# Patient Record
Sex: Female | Born: 1935 | Race: White | Hispanic: No | Smoking: Never smoker
Health system: Southern US, Community
[De-identification: ages and names within clinical notes are randomized; demographics above are authoritative.]

## PROBLEM LIST (undated history)

## (undated) DIAGNOSIS — F419 Anxiety disorder, unspecified: Secondary | ICD-10-CM

## (undated) DIAGNOSIS — I10 Essential (primary) hypertension: Secondary | ICD-10-CM

## (undated) DIAGNOSIS — K5900 Constipation, unspecified: Secondary | ICD-10-CM

## (undated) DIAGNOSIS — K8050 Calculus of bile duct without cholangitis or cholecystitis without obstruction: Secondary | ICD-10-CM

## (undated) DIAGNOSIS — A419 Sepsis, unspecified organism: Secondary | ICD-10-CM

## (undated) DIAGNOSIS — E7800 Pure hypercholesterolemia, unspecified: Secondary | ICD-10-CM

## (undated) DIAGNOSIS — J189 Pneumonia, unspecified organism: Secondary | ICD-10-CM

## (undated) DIAGNOSIS — C50419 Malignant neoplasm of upper-outer quadrant of unspecified female breast: Secondary | ICD-10-CM

## (undated) HISTORY — DX: Malignant neoplasm of upper-outer quadrant of unspecified female breast: C50.419

## (undated) HISTORY — DX: Anxiety disorder, unspecified: F41.9

## (undated) HISTORY — PX: TUMOR REMOVAL: SHX12

## (undated) HISTORY — PX: BREAST SURGERY: SHX581

## (undated) HISTORY — PX: HEMORRHOID SURGERY: SHX153

## (undated) HISTORY — DX: Pure hypercholesterolemia, unspecified: E78.00

## (undated) HISTORY — DX: Constipation, unspecified: K59.00

## (undated) HISTORY — PX: TONSILLECTOMY: SUR1361

---

## 2001-03-23 ENCOUNTER — Other Ambulatory Visit: Admission: RE | Admit: 2001-03-23 | Discharge: 2001-03-23 | Payer: Self-pay | Admitting: Family Medicine

## 2002-04-05 ENCOUNTER — Other Ambulatory Visit: Admission: RE | Admit: 2002-04-05 | Discharge: 2002-04-05 | Payer: Self-pay | Admitting: Family Medicine

## 2002-06-09 ENCOUNTER — Ambulatory Visit (HOSPITAL_COMMUNITY): Admission: RE | Admit: 2002-06-09 | Discharge: 2002-06-09 | Payer: Self-pay | Admitting: Gastroenterology

## 2003-05-04 ENCOUNTER — Other Ambulatory Visit: Admission: RE | Admit: 2003-05-04 | Discharge: 2003-05-04 | Payer: Self-pay | Admitting: Family Medicine

## 2004-05-21 ENCOUNTER — Other Ambulatory Visit: Admission: RE | Admit: 2004-05-21 | Discharge: 2004-05-21 | Payer: Self-pay | Admitting: Family Medicine

## 2005-05-27 ENCOUNTER — Other Ambulatory Visit: Admission: RE | Admit: 2005-05-27 | Discharge: 2005-05-27 | Payer: Self-pay | Admitting: Family Medicine

## 2006-06-09 ENCOUNTER — Other Ambulatory Visit: Admission: RE | Admit: 2006-06-09 | Discharge: 2006-06-09 | Payer: Self-pay | Admitting: Family Medicine

## 2007-06-17 ENCOUNTER — Other Ambulatory Visit: Admission: RE | Admit: 2007-06-17 | Discharge: 2007-06-17 | Payer: Self-pay | Admitting: Family Medicine

## 2008-09-06 ENCOUNTER — Other Ambulatory Visit: Admission: RE | Admit: 2008-09-06 | Discharge: 2008-09-06 | Payer: Self-pay | Admitting: Family Medicine

## 2009-09-12 ENCOUNTER — Other Ambulatory Visit: Admission: RE | Admit: 2009-09-12 | Discharge: 2009-09-12 | Payer: Self-pay | Admitting: Family Medicine

## 2010-09-20 ENCOUNTER — Other Ambulatory Visit: Payer: Self-pay | Admitting: Family Medicine

## 2010-09-20 ENCOUNTER — Other Ambulatory Visit (HOSPITAL_COMMUNITY)
Admission: RE | Admit: 2010-09-20 | Discharge: 2010-09-20 | Disposition: A | Discharge status: Home / Self Care | Payer: Medicare Other | Source: Ambulatory Visit | Attending: Family Medicine | Admitting: Family Medicine

## 2010-09-20 DIAGNOSIS — Z124 Encounter for screening for malignant neoplasm of cervix: Secondary | ICD-10-CM | POA: Insufficient documentation

## 2010-11-01 ENCOUNTER — Other Ambulatory Visit: Payer: Self-pay

## 2010-11-02 ENCOUNTER — Other Ambulatory Visit: Payer: Self-pay | Admitting: Radiology

## 2010-11-02 DIAGNOSIS — C50911 Malignant neoplasm of unspecified site of right female breast: Secondary | ICD-10-CM

## 2010-11-07 ENCOUNTER — Other Ambulatory Visit: Payer: Self-pay | Admitting: Oncology

## 2010-11-07 ENCOUNTER — Encounter (HOSPITAL_BASED_OUTPATIENT_CLINIC_OR_DEPARTMENT_OTHER): Payer: Medicare Other | Admitting: Oncology

## 2010-11-07 ENCOUNTER — Ambulatory Visit (HOSPITAL_BASED_OUTPATIENT_CLINIC_OR_DEPARTMENT_OTHER): Payer: Medicare Other | Admitting: General Surgery

## 2010-11-07 ENCOUNTER — Encounter (INDEPENDENT_AMBULATORY_CARE_PROVIDER_SITE_OTHER): Payer: Self-pay | Admitting: General Surgery

## 2010-11-07 VITALS — BP 161/83 | HR 68 | Temp 97.9°F | Resp 20 | Ht <= 58 in | Wt 124.4 lb

## 2010-11-07 DIAGNOSIS — C50419 Malignant neoplasm of upper-outer quadrant of unspecified female breast: Secondary | ICD-10-CM

## 2010-11-07 DIAGNOSIS — C50919 Malignant neoplasm of unspecified site of unspecified female breast: Secondary | ICD-10-CM

## 2010-11-07 DIAGNOSIS — Z171 Estrogen receptor negative status [ER-]: Secondary | ICD-10-CM

## 2010-11-07 HISTORY — DX: Malignant neoplasm of upper-outer quadrant of unspecified female breast: C50.419

## 2010-11-07 LAB — CBC WITH DIFFERENTIAL/PLATELET
Basophils Absolute: 0.1 10*3/uL (ref 0.0–0.1)
EOS%: 2 % (ref 0.0–7.0)
Eosinophils Absolute: 0.1 10*3/uL (ref 0.0–0.5)
HGB: 14 g/dL (ref 11.6–15.9)
LYMPH%: 16.2 % (ref 14.0–49.7)
MCH: 28.8 pg (ref 25.1–34.0)
MCV: 84 fL (ref 79.5–101.0)
MONO%: 8 % (ref 0.0–14.0)
NEUT%: 72.6 % (ref 38.4–76.8)
Platelets: 151 10*3/uL (ref 145–400)
RDW: 14.7 % — ABNORMAL HIGH (ref 11.2–14.5)

## 2010-11-07 LAB — CANCER ANTIGEN 27.29: CA 27.29: 25 U/mL (ref 0–39)

## 2010-11-07 LAB — CMP (CANCER CENTER ONLY)
ALT(SGPT): 26 U/L (ref 10–47)
AST: 24 U/L (ref 11–38)
Albumin: 3.6 g/dL (ref 3.3–5.5)
Calcium: 9.1 mg/dL (ref 8.0–10.3)
Chloride: 99 mEq/L (ref 98–108)
Potassium: 3.2 mEq/L — ABNORMAL LOW (ref 3.3–4.7)
Sodium: 139 mEq/L (ref 128–145)

## 2010-11-07 LAB — COMPREHENSIVE METABOLIC PANEL
Albumin: 3.7 g/dL (ref 3.5–5.2)
Alkaline Phosphatase: 74 U/L (ref 39–117)
BUN: 13 mg/dL (ref 6–23)
Calcium: 9.6 mg/dL (ref 8.4–10.5)
Chloride: 101 mEq/L (ref 96–112)
Glucose, Bld: 114 mg/dL — ABNORMAL HIGH (ref 70–99)
Potassium: 2.5 mEq/L — CL (ref 3.5–5.3)

## 2010-11-07 NOTE — Progress Notes (Signed)
Chief Complaint  Patient presents with  . Breast Cancer    HPI Jenna [name] is a 77 y.o. female.   HPI This patient is seen in evaluation today for a newly diagnosed right breast cancer. This was found on annual screening mammogram as a suspicious-appearing nodule in the right breast. She had an ultrasound-guided core biopsy which demonstrated well to moderately differentiated invasive ductal carcinoma.she states that she stay for with her screening mammograms and has not had any prior abnormalities except for benign fibrous tissue removed from bilateral breasts in the 1960s and 70s. She also states that she does her self breast exam monthly and has not noticed any changes. She denies any systemic symptoms such as abnormal weight loss, bony pains, or headaches. She states that she did have a short period of  hormone use, but does not currently take any hormones or contraceptives pills.she walks daily and states that she has good exercise tolerance she states that she had a stress test in the 90s but has not had any cardiac problems. On mammogram this appears to be a 1.5 cm mass in the right breast her MRI is pending.she is of [employer] and has a daughter who had bilateral mastectomy for DCIS at age 52 otherwise she has a first and second cousin on her mother side which was diagnosed with breast cancer but no other family history.  Past Medical History  Diagnosis Date  . Hypercholesteremia   . Anxiety   . Osteoporosis     No past surgical history on file.  No family history on file.  Social History History  Substance Use Topics  . Smoking status: Never Smoker   . Smokeless tobacco: Not on file  . Alcohol Use: No    Allergies  Allergen Reactions  . Sulfa Antibiotics Rash    Current Outpatient Prescriptions  Medication Sig Dispense Refill  . alendronate (FOSAMAX) 70 MG tablet 70 mg once a week.       Marland Kitchen aspirin 81 MG tablet Take 81 mg by mouth daily.        Marland Kitchen  atenolol (TENORMIN) 100 MG tablet       . atorvastatin (LIPITOR) 40 MG tablet       . cholecalciferol (VITAMIN D) 1000 UNITS tablet Take 1,000 Units by mouth daily.        Marland Kitchen LORazepam (ATIVAN) 1 MG tablet Take 1 mg by mouth every 8 (eight) hours.        Marland Kitchen losartan-hydrochlorothiazide (HYZAAR) 50-12.5 MG per tablet       . senna (SENOKOT) 8.6 MG TABS Take 1 tablet by mouth.        . sertraline (ZOLOFT) 100 MG tablet 50 mg 1 day or 1 dose.         Review of Systems Review of Systems  Constitutional: Negative.   HENT: Negative.   Eyes: Negative.   Respiratory: Negative.   Cardiovascular: Negative.   Gastrointestinal: Negative.   Genitourinary: Negative.   Musculoskeletal: Negative.   Skin: Negative.   Neurological: Negative.   Hematological: Negative.   Psychiatric/Behavioral: Negative.   All other systems reviewed and are negative.    Blood pressure 161/83, pulse 68, temperature 97.9 F (36.6 C), resp. rate 20, height 4\' 10"  (1.473 m), weight 124 lb 6.4 oz (56.427 kg).  Physical Exam Physical Exam  Constitutional: She is oriented to person, place, and time. She appears well-developed and well-nourished. No distress.  HENT:  Head: Normocephalic and atraumatic.  Eyes: Conjunctivae  and EOM are normal. Pupils are equal, round, and reactive to light. Right eye exhibits no discharge. Left eye exhibits no discharge. No scleral icterus.  Neck: Normal range of motion. Neck supple. No tracheal deviation present.  Pulmonary/Chest: Effort normal. No stridor. No respiratory distress.       On the left breast she has a prior biopsy scar in the upper inner portion of the breast in an oblique fashion extending towards the nipple areolar complex, there is no evidence of skin changes or dimpling, no suspicious masses, no axillary or supraclavicular adenopathy  On the right breast she has area in the upper outer quadrant with a well-healed surgical scar from a prior biopsy and this is the area of  her most recent core biopsy. She has some postbiopsy changes but she does have some mild dimpling in the region but cannot be sure if this is due to her current tumor or 2 her previous biopsy. She does have some fullness in the area but no discrete mass. She has no evidence of axillary or supraclavicular adenopathy on the right.  Abdominal: Soft. She exhibits no distension. There is no tenderness. There is no rebound.  Musculoskeletal: Normal range of motion.  Neurological: She is alert and oriented to person, place, and time.  Skin: Skin is warm and dry. No rash noted. She is not diaphoretic. No erythema.  Psychiatric: She has a normal mood and affect. Her behavior is normal. Judgment and thought content normal.    Data Reviewed Mammogram and pathologyand laboratory studies are available and without significant abnormality other than some hypokalemia  Assessment/Plan    Right breast cancer  She does have well to moderately differentiated invasive ductal carcinoma on the right. This is not obviously involving the skin or the surrounding tissues but she does have some mild dimpling in the area, I cannot be sure if this is from her tumor or from prior biopsy site. I had a long discussion with her and her family regarding the treatment options in the multidisciplinary breast clinic. We discussed the surgical options of breast conservation therapy versus mastectomy with the risks and benefits of each and the pros and cons of each, I think she would be a fine candidate for either option.. We discussed needle localized lumpectomy with sentinel lymph node biopsy versus mastectomy with sentinel lymph node biopsy and she is leaning towards breast conservation treatment.I explained that if she chooses breast conservation treatment and she will require postoperative radiation treatment. She is scheduled to meet with the radiation oncologist today as well as well as the medical oncologist. Again, she is leaning  towards lumpectomy with sentinel lymph node biopsy. I discussed with her the risks of the surgery including the need for reoperative treatment and reexcision, need for radiation, poor cosmesis, infection, bleeding, nerve injury, lymphedema, false positive for false negative results. She expressed understanding and would like to proceed with breast conservation treatment. She has a friend who is treated by my partner Dr. Jamey Ripa for a similar problem and she would like to see him for evaluation and surgical management. She was given her contact information and desires to follow up with him.I spent 45 minutes with the patient in the family and discussing her results and future treatment plans.               Lodema Pilot DAVID 11/07/2010, 12:07 PM

## 2010-11-09 ENCOUNTER — Ambulatory Visit
Admission: RE | Admit: 2010-11-09 | Discharge: 2010-11-09 | Disposition: A | Discharge status: Home / Self Care | Payer: Medicare Other | Source: Ambulatory Visit | Attending: Radiology | Admitting: Radiology

## 2010-11-09 ENCOUNTER — Encounter: Payer: Medicare Other | Admitting: Genetic Counselor

## 2010-11-09 DIAGNOSIS — C50911 Malignant neoplasm of unspecified site of right female breast: Secondary | ICD-10-CM

## 2010-11-09 MED ORDER — GADOBENATE DIMEGLUMINE 529 MG/ML IV SOLN
11.0000 mL | Freq: Once | INTRAVENOUS | Status: AC | PRN
  Administered 2010-11-09: 11 mL via INTRAVENOUS

## 2010-11-13 ENCOUNTER — Encounter (INDEPENDENT_AMBULATORY_CARE_PROVIDER_SITE_OTHER): Payer: Medicare Other | Admitting: Surgery

## 2010-11-20 ENCOUNTER — Ambulatory Visit (INDEPENDENT_AMBULATORY_CARE_PROVIDER_SITE_OTHER): Payer: Medicare Other | Admitting: Surgery

## 2010-11-20 ENCOUNTER — Encounter (INDEPENDENT_AMBULATORY_CARE_PROVIDER_SITE_OTHER): Payer: Self-pay | Admitting: Surgery

## 2010-11-20 ENCOUNTER — Other Ambulatory Visit (INDEPENDENT_AMBULATORY_CARE_PROVIDER_SITE_OTHER): Payer: Self-pay | Admitting: Surgery

## 2010-11-20 VITALS — BP 126/82 | HR 72 | Temp 96.2°F | Ht 59.75 in | Wt 125.5 lb

## 2010-11-20 DIAGNOSIS — C50911 Malignant neoplasm of unspecified site of right female breast: Secondary | ICD-10-CM

## 2010-11-20 DIAGNOSIS — C50419 Malignant neoplasm of upper-outer quadrant of unspecified female breast: Secondary | ICD-10-CM

## 2010-11-20 NOTE — Progress Notes (Signed)
CC: Breast cancer HPI: This patient recently had a routine mammogram and an abnormality was found in the right breast. A needle core biopsy showed invasive ductal carcinoma. She was seen at the breast multidisciplinary clinic and an MRI requested and the suggestion made for partial mastectomy with sentinel node evaluation, assuming the MRI is negative. She was also scheduled for a bone scan.  Past Medical History  Diagnosis Date  . Hypercholesteremia   . Anxiety   . Osteoporosis    Current Outpatient Prescriptions  Medication Sig Dispense Refill  . alendronate (FOSAMAX) 70 MG tablet 70 mg once a week.       Marland Kitchen atenolol (TENORMIN) 100 MG tablet       . atorvastatin (LIPITOR) 40 MG tablet       . cholecalciferol (VITAMIN D) 1000 UNITS tablet Take 1,000 Units by mouth daily.        Marland Kitchen LORazepam (ATIVAN) 1 MG tablet Take 1 mg by mouth every 8 (eight) hours.        Marland Kitchen losartan-hydrochlorothiazide (HYZAAR) 50-12.5 MG per tablet       . senna (SENOKOT) 8.6 MG TABS Take 1 tablet by mouth.        . sertraline (ZOLOFT) 100 MG tablet 50 mg 1 day or 1 dose.        Allergies  Allergen Reactions  . Sulfa Antibiotics Rash       PE General: The patient is alert oriented and healthy-appearing Lungs: Clear to auscultation, normal respirations. Heart: Regular rhythm no murmurs rubs or gallops. Breasts: Symmetric. There is a right breast scar and a left breast scar from remote biopsies. There is no dominant mass on either side. There is no evidence of a hematoma or ecchymosis from her biopsy. The skin nipple and areolar areas looked normal. Lymphatics: There is no axillary or supraclavicular adenopathy noted. Abdomen: Soft and benign. No masses or organomegaly. Extremities: Good range of motion, no edema. Minimal varicosities.  Data Reviewed Her MRI focal report indicates a single 1.6 cm in maximum size right breast mass consistent with a cancer. There is no evidence of other breast cancers or  lesions.  I have seen her pathology slides and mammogram films in the breast multidisciplinary conference.  Assessment Clinical stage I right breast cancer upper outer quadrant  Plan Currently I think the best plan is for a needle guided lumpectomy with sentinel node evaluation. This could change if she has evidence of metastatic disease on her scan scheduled for tomorrow.  I discussed the indications risks and complications of the surgery. I told her that she may have to have a reexcision if she has positive margins. I told her the alternative was for a mastectomy. I thought she would have an equivalent to a rate with a lumpectomy and recommended that is her choice.  We want to try to get her scheduled at her convenience hopefully in the next two weeks.

## 2010-11-21 ENCOUNTER — Encounter (HOSPITAL_COMMUNITY)
Admission: RE | Admit: 2010-11-21 | Discharge: 2010-11-21 | Disposition: A | Discharge status: Home / Self Care | Payer: Medicare Other | Source: Ambulatory Visit | Attending: Oncology | Admitting: Oncology

## 2010-11-21 ENCOUNTER — Telehealth (INDEPENDENT_AMBULATORY_CARE_PROVIDER_SITE_OTHER): Payer: Self-pay | Admitting: Surgery

## 2010-11-21 ENCOUNTER — Encounter (HOSPITAL_COMMUNITY): Payer: Self-pay

## 2010-11-21 DIAGNOSIS — M479 Spondylosis, unspecified: Secondary | ICD-10-CM | POA: Insufficient documentation

## 2010-11-21 DIAGNOSIS — K449 Diaphragmatic hernia without obstruction or gangrene: Secondary | ICD-10-CM | POA: Insufficient documentation

## 2010-11-21 DIAGNOSIS — M4120 Other idiopathic scoliosis, site unspecified: Secondary | ICD-10-CM | POA: Insufficient documentation

## 2010-11-21 DIAGNOSIS — C50919 Malignant neoplasm of unspecified site of unspecified female breast: Secondary | ICD-10-CM

## 2010-11-21 DIAGNOSIS — J984 Other disorders of lung: Secondary | ICD-10-CM | POA: Insufficient documentation

## 2010-11-21 MED ORDER — TECHNETIUM TC 99M MEDRONATE IV KIT
26.1000 | PACK | Freq: Once | INTRAVENOUS | Status: AC | PRN
  Administered 2010-11-21: 26.1 via INTRAVENOUS

## 2010-11-22 ENCOUNTER — Encounter (HOSPITAL_BASED_OUTPATIENT_CLINIC_OR_DEPARTMENT_OTHER)
Admission: RE | Admit: 2010-11-22 | Discharge: 2010-11-22 | Disposition: A | Discharge status: Home / Self Care | Payer: Medicare Other | Source: Ambulatory Visit | Attending: Surgery | Admitting: Surgery

## 2010-11-22 ENCOUNTER — Telehealth (INDEPENDENT_AMBULATORY_CARE_PROVIDER_SITE_OTHER): Payer: Self-pay | Admitting: General Surgery

## 2010-11-22 LAB — BASIC METABOLIC PANEL
GFR calc Af Amer: >60 mL/min (ref >60)
GFR calc non Af Amer: >60 mL/min (ref >60)
Glucose, Bld: 104 mg/dL — ABNORMAL HIGH (ref 70–99)
Potassium: 3.6 mEq/L (ref 3.5–5.1)
Sodium: 140 mEq/L (ref 135–145)

## 2010-11-22 LAB — CBC
Hemoglobin: 13.6 g/dL (ref 12.0–15.0)
MCHC: 33.7 g/dL (ref 30.0–36.0)

## 2010-11-22 LAB — DIFFERENTIAL
Basophils Absolute: 0 10*3/uL (ref 0.0–0.1)
Basophils Relative: 1 % (ref 0–1)
Monocytes Absolute: 0.5 10*3/uL (ref 0.1–1.0)
Neutro Abs: 3.4 10*3/uL (ref 1.7–7.7)
Neutrophils Relative %: 62 % (ref 43–77)

## 2010-11-22 NOTE — Telephone Encounter (Signed)
Patient returned call from Dr Jamey Ripa. I made her aware Dr Jamey Ripa was going to go over results from her CT and bone scan. I made patient aware that everything looked ok but her CT did show some tiny lung nodules that Dr Jamey Ripa said were probably nothing to worry about. Patient made aware and will proceed with planned surgery. Patient will call with any problems.

## 2010-11-23 ENCOUNTER — Encounter: Payer: Medicare Other | Admitting: Oncology

## 2010-11-23 ENCOUNTER — Telehealth (INDEPENDENT_AMBULATORY_CARE_PROVIDER_SITE_OTHER): Payer: Self-pay | Admitting: Surgery

## 2010-11-23 NOTE — Telephone Encounter (Signed)
The CT scan of the chest showed a couple of likely benign nodules. I called the patient to review the results with her.

## 2010-11-23 NOTE — Telephone Encounter (Signed)
I spoke with the patient's daughter today to discuss the results of the CT scan. The patient was notified yesterday that a couple of tiny nodules were noted in the lung on the chest CT. These are nonspecific and likely benign. She has to be speak with the daughter as well. I explained the results to the daughter and told her that in patients at risk for lung cancer a followup CT is appropriate at about 12 months. Otherwise no particular followup is needed.

## 2010-11-26 ENCOUNTER — Other Ambulatory Visit (INDEPENDENT_AMBULATORY_CARE_PROVIDER_SITE_OTHER): Payer: Self-pay | Admitting: Surgery

## 2010-11-26 ENCOUNTER — Encounter (INDEPENDENT_AMBULATORY_CARE_PROVIDER_SITE_OTHER): Payer: Self-pay | Admitting: Surgery

## 2010-11-26 ENCOUNTER — Ambulatory Visit (HOSPITAL_BASED_OUTPATIENT_CLINIC_OR_DEPARTMENT_OTHER)
Admission: RE | Admit: 2010-11-26 | Discharge: 2010-11-26 | Disposition: A | Discharge status: Home / Self Care | Payer: Medicare Other | Source: Ambulatory Visit | Attending: Surgery | Admitting: Surgery

## 2010-11-26 ENCOUNTER — Ambulatory Visit (HOSPITAL_COMMUNITY)
Admission: RE | Admit: 2010-11-26 | Discharge: 2010-11-26 | Disposition: A | Discharge status: Home / Self Care | Payer: Medicare Other | Source: Ambulatory Visit | Attending: Surgery | Admitting: Surgery

## 2010-11-26 DIAGNOSIS — C50919 Malignant neoplasm of unspecified site of unspecified female breast: Secondary | ICD-10-CM | POA: Insufficient documentation

## 2010-11-26 DIAGNOSIS — D0590 Unspecified type of carcinoma in situ of unspecified breast: Secondary | ICD-10-CM | POA: Insufficient documentation

## 2010-11-26 DIAGNOSIS — C50911 Malignant neoplasm of unspecified site of right female breast: Secondary | ICD-10-CM

## 2010-11-26 DIAGNOSIS — C50419 Malignant neoplasm of upper-outer quadrant of unspecified female breast: Secondary | ICD-10-CM | POA: Insufficient documentation

## 2010-11-26 DIAGNOSIS — Z01810 Encounter for preprocedural cardiovascular examination: Secondary | ICD-10-CM | POA: Insufficient documentation

## 2010-11-26 DIAGNOSIS — Z01812 Encounter for preprocedural laboratory examination: Secondary | ICD-10-CM | POA: Insufficient documentation

## 2010-11-26 HISTORY — PX: BREAST LUMPECTOMY W/ NEEDLE LOCALIZATION: SHX1266

## 2010-11-26 MED ORDER — TECHNETIUM TC 99M SULFUR COLLOID FILTERED
1.0000 | Freq: Once | INTRAVENOUS | Status: AC | PRN
  Administered 2010-11-26: 1 via INTRADERMAL

## 2010-11-29 ENCOUNTER — Telehealth (INDEPENDENT_AMBULATORY_CARE_PROVIDER_SITE_OTHER): Payer: Self-pay | Admitting: General Surgery

## 2010-11-29 ENCOUNTER — Encounter (INDEPENDENT_AMBULATORY_CARE_PROVIDER_SITE_OTHER): Payer: Self-pay | Admitting: Surgery

## 2010-11-29 NOTE — Telephone Encounter (Signed)
Called patient. Left message on machine for patient to call back for path results.

## 2010-11-29 NOTE — Telephone Encounter (Signed)
Message copied by Liliana Cline on Thu Nov 29, 2010  1:18 PM ------      Message from: Currie Paris      Created: Thu Nov 29, 2010 12:58 PM       Tell the patient that her margins are OK and her lymph nodes are negative. I will discuss in detail in the office.

## 2010-11-30 NOTE — Telephone Encounter (Signed)
Patient made aware that her pathology results were good. Lymph nodes negative and margins clear. She will follow up next Thursday and call with any problems prior.

## 2010-12-06 ENCOUNTER — Encounter (INDEPENDENT_AMBULATORY_CARE_PROVIDER_SITE_OTHER): Payer: Self-pay | Admitting: Surgery

## 2010-12-06 ENCOUNTER — Ambulatory Visit (INDEPENDENT_AMBULATORY_CARE_PROVIDER_SITE_OTHER): Payer: Medicare Other | Admitting: Surgery

## 2010-12-06 VITALS — BP 114/72 | HR 68 | Temp 97.2°F | Resp 16 | Ht 59.5 in | Wt 126.0 lb

## 2010-12-06 DIAGNOSIS — C50419 Malignant neoplasm of upper-outer quadrant of unspecified female breast: Secondary | ICD-10-CM

## 2010-12-06 MED ORDER — HYDROCODONE-ACETAMINOPHEN 10-325 MG PO TABS: 1.0000 | ORAL_TABLET | ORAL | Status: DC | PRN

## 2010-12-06 NOTE — Patient Instructions (Signed)
We will give you a different pain pill prescription to take. Need to start exercising or shoulder more. We will give you some information about the after breast cancer class. I will need to see you back in a month call me if you're having any problems

## 2010-12-06 NOTE — Progress Notes (Signed)
Jenna [name]    [id number] 12/06/2010    03-02-1932   CC: Post op   HPI: The patient returns for post op follow-up. She underwent a right lumpectomy with SLN on 11/26/10. Over all she feels that she is doing well. She has some pain still, mainly upper posterior arm  PE: The incision is healing nicely and there is no evidence of infection or hematoma.  DATA REVIEWED: Pathology report showed IDC 1.5 cm, neg SLN x4, margins ok  IMPRESSION: Patient doing well. Lumpectomy   PLAN: Her next visit will be in one month. She has oncology follow up in two weeks. Gave her the [facility] materials. Gave her a copy of the path.

## 2010-12-18 ENCOUNTER — Other Ambulatory Visit: Payer: Self-pay | Admitting: Oncology

## 2010-12-18 ENCOUNTER — Encounter (HOSPITAL_BASED_OUTPATIENT_CLINIC_OR_DEPARTMENT_OTHER): Payer: Medicare Other | Admitting: Oncology

## 2010-12-18 DIAGNOSIS — C50419 Malignant neoplasm of upper-outer quadrant of unspecified female breast: Secondary | ICD-10-CM

## 2010-12-18 LAB — CBC WITH DIFFERENTIAL/PLATELET
Basophils Absolute: 0 10*3/uL (ref 0.0–0.1)
Eosinophils Absolute: 0.1 10*3/uL (ref 0.0–0.5)
HGB: 13.6 g/dL (ref 11.6–15.9)
MCV: 84.7 fL (ref 79.5–101.0)
MONO#: 0.5 10*3/uL (ref 0.1–0.9)
NEUT#: 3.1 10*3/uL (ref 1.5–6.5)
RDW: 15.1 % — ABNORMAL HIGH (ref 11.2–14.5)
WBC: 5 10*3/uL (ref 3.9–10.3)
lymph#: 1.3 10*3/uL (ref 0.9–3.3)

## 2010-12-18 LAB — BASIC METABOLIC PANEL
BUN: 19 mg/dL (ref 6–23)
CO2: 24 mEq/L (ref 19–32)
Chloride: 104 mEq/L (ref 96–112)
Glucose, Bld: 93 mg/dL (ref 70–99)
Potassium: 3.3 mEq/L — ABNORMAL LOW (ref 3.5–5.3)

## 2011-01-01 ENCOUNTER — Ambulatory Visit
Admission: RE | Admit: 2011-01-01 | Discharge: 2011-01-01 | Disposition: A | Discharge status: Home / Self Care | Payer: Medicare Other | Source: Ambulatory Visit | Attending: Radiation Oncology | Admitting: Radiation Oncology

## 2011-01-01 DIAGNOSIS — C50419 Malignant neoplasm of upper-outer quadrant of unspecified female breast: Secondary | ICD-10-CM | POA: Insufficient documentation

## 2011-01-01 DIAGNOSIS — Z510 Encounter for antineoplastic radiation therapy: Secondary | ICD-10-CM | POA: Insufficient documentation

## 2011-01-07 ENCOUNTER — Ambulatory Visit
Admission: RE | Admit: 2011-01-07 | Discharge: 2011-01-07 | Disposition: A | Discharge status: Home / Self Care | Payer: Medicare Other | Source: Ambulatory Visit | Attending: Radiation Oncology | Admitting: Radiation Oncology

## 2011-01-07 DIAGNOSIS — C50919 Malignant neoplasm of unspecified site of unspecified female breast: Secondary | ICD-10-CM

## 2011-01-07 NOTE — Procedures (Signed)
[facility] Radiation Oncology Simulation and Treatment Planning Note   Name: Jenna [name] MRN: [id number]  Date: 01/07/2011  DOB: 1931/07/28  Status:outpatient    DIAGNOSIS: There were no encounter diagnoses. No matching staging information was found for the patient.   SIDE: right   CONSENT VERIFIED:yes   SET UP: Patient is setup supine   IMMOBILIZATION: Following immobilization is used   NARRATIVE:The patient was brought to the CT Simulation planning suite.  Identity was confirmed.  All relevant records and images related to the planned course of therapy were reviewed.  Then, the patient was positioned in a stable reproducible clinical set-up for radiation therapy.  CT images were obtained.  Skin markings were placed.  The CT images were loaded into the planning software where the target and avoidance structures were contoured.  The radiation prescription was entered and confirmed.   TREATMENT PLANNING NOTE:  Treatment planning then occurred. I have requested :2 sets of MLCs, and isodose plan and dosimetry.

## 2011-01-11 ENCOUNTER — Ambulatory Visit (INDEPENDENT_AMBULATORY_CARE_PROVIDER_SITE_OTHER): Payer: Medicare Other | Admitting: Surgery

## 2011-01-11 ENCOUNTER — Encounter (INDEPENDENT_AMBULATORY_CARE_PROVIDER_SITE_OTHER): Payer: Self-pay | Admitting: Surgery

## 2011-01-11 VITALS — BP 118/76 | HR 64 | Temp 97.2°F | Resp 12 | Ht 59.0 in | Wt 124.0 lb

## 2011-01-11 DIAGNOSIS — Z09 Encounter for follow-up examination after completed treatment for conditions other than malignant neoplasm: Secondary | ICD-10-CM

## 2011-01-11 DIAGNOSIS — C50419 Malignant neoplasm of upper-outer quadrant of unspecified female breast: Secondary | ICD-10-CM

## 2011-01-11 NOTE — Progress Notes (Signed)
Jenna [name]    [id number] 01/11/2011    11/29/1931   CC: Post op   HPI: The patient returns for post op follow-up. She underwent a right lumpectomy with SLN on 11/26/10. Over all she feels that she is doing well.   PE: The incision is healing nicely and there is no evidence of infection or hematoma.  DATA REVIEWED: Notes from oncology. She has declined chemo and will start radiation  IMPRESSION: Patient doing well. Lumpectomy   PLAN: Her next visit will be in two months.

## 2011-01-14 ENCOUNTER — Ambulatory Visit
Admission: RE | Admit: 2011-01-14 | Discharge: 2011-01-14 | Disposition: A | Discharge status: Home / Self Care | Payer: Medicare Other | Source: Ambulatory Visit | Attending: Radiation Oncology | Admitting: Radiation Oncology

## 2011-01-14 NOTE — Procedures (Signed)
CC:   Maryln Gottron, M.D.  NARRATIVE:  Ms. Jenna [name] underwent simulation verification for treatment to her right breast.  Her isocenter was in good position, and the multileaf collimators contoured the treatment volume appropriately.    ______________________________ Maryln Gottron, M.D. RJM/MEDQ  D:  01/14/2011  T:  01/14/2011  Job:  [id number]

## 2011-01-15 ENCOUNTER — Ambulatory Visit
Admission: RE | Admit: 2011-01-15 | Discharge: 2011-01-15 | Disposition: A | Discharge status: Home / Self Care | Payer: Medicare Other | Source: Ambulatory Visit | Attending: Radiation Oncology | Admitting: Radiation Oncology

## 2011-01-15 DIAGNOSIS — C50419 Malignant neoplasm of upper-outer quadrant of unspecified female breast: Secondary | ICD-10-CM

## 2011-01-15 MED ORDER — RADIAPLEXRX EX GEL
Freq: Two times a day (BID) | CUTANEOUS | Status: DC
  Administered 2011-01-15: 16:00:00 via TOPICAL

## 2011-01-15 NOTE — Progress Notes (Signed)
Post sim ed done, all questions answered. Pt and daughter given radiation and you booklet. Radiaplex, [place] given to pt w/thorough instructions.

## 2011-01-16 ENCOUNTER — Ambulatory Visit
Admission: RE | Admit: 2011-01-16 | Discharge: 2011-01-16 | Disposition: A | Discharge status: Home / Self Care | Payer: Medicare Other | Source: Ambulatory Visit | Attending: Radiation Oncology | Admitting: Radiation Oncology

## 2011-01-16 DIAGNOSIS — C50419 Malignant neoplasm of upper-outer quadrant of unspecified female breast: Secondary | ICD-10-CM

## 2011-01-16 NOTE — Progress Notes (Signed)
[facility] Radiation Oncology Weekly Treatment Note    Name: MANASA SPEASE Date: 01/16/2011 MRN: [id number] DOB: January 28, 1932  Status:outpatient    Current dose: 500cGy  Current fraction:2  Planned dose:5000cGy  Planned fraction:20  MEDICATIONS:Current outpatient prescriptions:atenolol (TENORMIN) 100 MG tablet, , Disp: , Rfl: ;  atorvastatin (LIPITOR) 40 MG tablet, , Disp: , Rfl: ;  calcium carbonate (OS-CAL) 1250 MG chewable tablet, Chew 1 tablet by mouth daily. This is the candy chewables , Disp: , Rfl: ;  cholecalciferol (VITAMIN D) 1000 UNITS tablet, Take 1,000 Units by mouth daily.  , Disp: , Rfl:  LORazepam (ATIVAN) 1 MG tablet, Take 1 mg by mouth every 8 (eight) hours.  , Disp: , Rfl: ;  losartan-hydrochlorothiazide (HYZAAR) 50-12.5 MG per tablet, , Disp: , Rfl: ;  senna (SENOKOT) 8.6 MG TABS, Take 1 tablet by mouth.  , Disp: , Rfl: ;  sertraline (ZOLOFT) 100 MG tablet, 50 mg 1 day or 1 dose. , Disp: , Rfl:  No current facility-administered medications for this encounter. Facility-Administered Medications Ordered in Other Encounters: DISCONTD: hyaluronate sodium (RADIAPLEXRX) gel, , Topical, BID, Maryln Gottron, MD   ALLERGIES: Iodine and Sulfa antibiotics    NARRATIVE: ESCARLET SAATHOFF was seen today for weekly treatment management. The chart was checked and port films images were reviewed.  PHYSICAL EXAMINATION: vitals were not taken for this visit.        the patient is without complaints today.    ASSESSMENT: Patient tolerating treatments well.    PLAN: Continue treatment as planned.

## 2011-01-16 NOTE — Progress Notes (Signed)
reviewd skin products again with pt, no c/o pain or discomfort

## 2011-01-17 ENCOUNTER — Ambulatory Visit
Admission: RE | Admit: 2011-01-17 | Discharge: 2011-01-17 | Disposition: A | Discharge status: Home / Self Care | Payer: Medicare Other | Source: Ambulatory Visit | Attending: Radiation Oncology | Admitting: Radiation Oncology

## 2011-01-18 ENCOUNTER — Ambulatory Visit
Admission: RE | Admit: 2011-01-18 | Discharge: 2011-01-18 | Disposition: A | Discharge status: Home / Self Care | Payer: Medicare Other | Source: Ambulatory Visit | Attending: Radiation Oncology | Admitting: Radiation Oncology

## 2011-01-19 ENCOUNTER — Ambulatory Visit
Admission: RE | Admit: 2011-01-19 | Discharge: 2011-01-19 | Disposition: A | Discharge status: Home / Self Care | Payer: Medicare Other | Source: Ambulatory Visit | Attending: Radiation Oncology | Admitting: Radiation Oncology

## 2011-01-21 ENCOUNTER — Ambulatory Visit
Admission: RE | Admit: 2011-01-21 | Discharge: 2011-01-21 | Disposition: A | Discharge status: Home / Self Care | Payer: Medicare Other | Source: Ambulatory Visit | Attending: Radiation Oncology | Admitting: Radiation Oncology

## 2011-01-21 VITALS — Wt 126.3 lb

## 2011-01-21 DIAGNOSIS — C50419 Malignant neoplasm of upper-outer quadrant of unspecified female breast: Secondary | ICD-10-CM

## 2011-01-21 NOTE — Progress Notes (Signed)
[facility] Radiation Oncology Weekly Treatment Note    Name: Jenna [name] Date: 01/21/2011 MRN: [id number] DOB: [birth year]  Status:outpatient    Current dose: 1500cGy  Current fraction:6  Planned dose:5000cGy  Planned fraction:20   ALLERGIES: Iodine and Sulfa antibiotics    NARRATIVE: Jenna [name] was seen today for weekly treatment management. The chart was checked and port films images were reviewed. She is using Radioplex gel. No complaints today  PHYSICAL EXAMINATION: weight is 126 lb 4.8 oz (57.289 kg).        no significant skin changes.    ASSESSMENT: Patient tolerating treatments well.    PLAN: Continue treatment as planned.

## 2011-01-22 ENCOUNTER — Ambulatory Visit
Admission: RE | Admit: 2011-01-22 | Discharge: 2011-01-22 | Disposition: A | Discharge status: Home / Self Care | Payer: Medicare Other | Source: Ambulatory Visit | Attending: Radiation Oncology | Admitting: Radiation Oncology

## 2011-01-23 ENCOUNTER — Ambulatory Visit
Admission: RE | Admit: 2011-01-23 | Discharge: 2011-01-23 | Disposition: A | Discharge status: Home / Self Care | Payer: Medicare Other | Source: Ambulatory Visit | Attending: Radiation Oncology | Admitting: Radiation Oncology

## 2011-01-28 ENCOUNTER — Encounter: Payer: Self-pay | Admitting: Radiation Oncology

## 2011-01-28 ENCOUNTER — Ambulatory Visit
Admission: RE | Admit: 2011-01-28 | Discharge: 2011-01-28 | Disposition: A | Discharge status: Home / Self Care | Payer: Medicare Other | Source: Ambulatory Visit | Attending: Radiation Oncology | Admitting: Radiation Oncology

## 2011-01-28 VITALS — Wt 127.4 lb

## 2011-01-28 DIAGNOSIS — C50419 Malignant neoplasm of upper-outer quadrant of unspecified female breast: Secondary | ICD-10-CM

## 2011-01-28 NOTE — Progress Notes (Signed)
Weekly Management Note:  Site:R Breast Current Dose:  2250  cGy Projected Dose: 5000  cGy  Narrative: The patient is seen today for routine under treatment assessment. CBCT/MVCT images/port films were reviewed. The chart was reviewed.   She is doing well and is without complaints today. She uses Radioplex gel when necessary.  Physical Examination: There were no vitals filed for this visit..  Weight: 127 lb 6.4 oz (57.788 kg). There is faint erythema/hyperpigmentation of the skin along the right breast.  Impression: Tolerating radiation therapy well.  Plan: Continue radiation therapy as planned.

## 2011-01-28 NOTE — Progress Notes (Signed)
Simulation note:  The patient underwent virtual simulation for her right breast electron beam boost. She was set up en face to the right breast tumor bed. One custom block was constructed. A special port plan is requested. I am prescribing 750 cGy in 3 sessions utilizing 15 MEV electrons. I chose the 95% isodose curve.

## 2011-01-28 NOTE — Progress Notes (Signed)
Very slight erythema on right breast area, no c/o, at night slight earlier to bed , doing well otherwise Not otherwise examined today.

## 2011-01-29 ENCOUNTER — Ambulatory Visit
Admission: RE | Admit: 2011-01-29 | Discharge: 2011-01-29 | Disposition: A | Discharge status: Home / Self Care | Payer: Medicare Other | Source: Ambulatory Visit | Attending: Radiation Oncology | Admitting: Radiation Oncology

## 2011-01-30 ENCOUNTER — Ambulatory Visit
Admission: RE | Admit: 2011-01-30 | Discharge: 2011-01-30 | Disposition: A | Discharge status: Home / Self Care | Payer: Medicare Other | Source: Ambulatory Visit | Attending: Radiation Oncology | Admitting: Radiation Oncology

## 2011-01-30 NOTE — Progress Notes (Signed)
CC:   Robert A. Nicholos Johns, M.D.  PROBLEM:  History of triple-negative breast cancer, status post lumpectomy.  Ms. Vogt returns for followup.  She did undergo lumpectomy on 11/26/2010.  Final pathology showed a grade 1 of 3 invasive ductal cancer measuring 1.5 cm.  A total of 4 sentinel lymph nodes were identified, all of which were negative for malignancy.  This was an ER/PR negative tumor.  HER-2 was not amplified.  She did have some staging scans done, including a CT of the chest, abdomen, and pelvis. There were 2 adjacent nodules seen in the lungs, one in the left upper lobe measuring 2.9 mm and one in the right upper lobe measuring 0.4 cm. No worrisome bone lesions were seen.  One-year followup was recommended. Tissue was submitted to [employer] to try to identify whether this was a high-risk tumor and this was based on its parameters.  We had a long discussion with Ms. Titterington based on this and she ultimately had elected not to pursue any form of adjuvant chemotherapy.  She had been seen by Dr. Dayton Scrape as well, who has made a plan to do radiation therapy. We reviewed all these issues with Ms. Olivar today.  I plan to see her again in followup after she has completed radiation.  We also reviewed the issues of genetic testing.  As of yet, she has not done so. I will plan to see her again in followup after she has completed radiation.    ______________________________ Pierce Crane, M.D., F.R.C.P.C. PR/MEDQ  D:  01/30/2011  T:  01/30/2011  Job:  [id number]

## 2011-01-31 ENCOUNTER — Ambulatory Visit: Payer: Medicare Other

## 2011-01-31 ENCOUNTER — Ambulatory Visit
Admission: RE | Admit: 2011-01-31 | Discharge: 2011-01-31 | Disposition: A | Discharge status: Home / Self Care | Payer: Medicare Other | Source: Ambulatory Visit | Attending: Radiation Oncology | Admitting: Radiation Oncology

## 2011-02-01 ENCOUNTER — Ambulatory Visit
Admission: RE | Admit: 2011-02-01 | Discharge: 2011-02-01 | Disposition: A | Discharge status: Home / Self Care | Payer: Medicare Other | Source: Ambulatory Visit | Attending: Radiation Oncology | Admitting: Radiation Oncology

## 2011-02-04 ENCOUNTER — Encounter: Payer: Medicare Other | Admitting: Radiation Oncology

## 2011-02-04 ENCOUNTER — Ambulatory Visit
Admission: RE | Admit: 2011-02-04 | Discharge: 2011-02-04 | Disposition: A | Discharge status: Home / Self Care | Payer: Medicare Other | Source: Ambulatory Visit | Attending: Radiation Oncology | Admitting: Radiation Oncology

## 2011-02-04 VITALS — Wt 128.7 lb

## 2011-02-04 DIAGNOSIS — C50419 Malignant neoplasm of upper-outer quadrant of unspecified female breast: Secondary | ICD-10-CM

## 2011-02-04 NOTE — Progress Notes (Signed)
Weekly Management Note:  Site:R Breast Current Dose:  3500  cGy Projected Dose: 5000  cGy  Narrative: The patient is seen today for routine under treatment assessment. CBCT/MVCT images/port films were reviewed. The chart was reviewed.   No complaints today. She is using Radioplex gel when necessary.  Physical Examination: There were no vitals filed for this visit..  Weight: 128 lb 11.2 oz (58.378 kg). There is faint erythema the skin with no areas of desquamation.  Impression: Tolerating radiation therapy well.  Plan: Continue radiation therapy as planned.

## 2011-02-04 NOTE — Progress Notes (Signed)
NO C/O 

## 2011-02-05 ENCOUNTER — Ambulatory Visit
Admission: RE | Admit: 2011-02-05 | Discharge: 2011-02-05 | Disposition: A | Discharge status: Home / Self Care | Payer: Medicare Other | Source: Ambulatory Visit | Attending: Radiation Oncology | Admitting: Radiation Oncology

## 2011-02-06 ENCOUNTER — Ambulatory Visit: Payer: Medicare Other | Admitting: Radiation Oncology

## 2011-02-06 ENCOUNTER — Ambulatory Visit
Admission: RE | Admit: 2011-02-06 | Discharge: 2011-02-06 | Disposition: A | Discharge status: Home / Self Care | Payer: Medicare Other | Source: Ambulatory Visit | Attending: Radiation Oncology | Admitting: Radiation Oncology

## 2011-02-07 ENCOUNTER — Ambulatory Visit
Admission: RE | Admit: 2011-02-07 | Discharge: 2011-02-07 | Disposition: A | Discharge status: Home / Self Care | Payer: Medicare Other | Source: Ambulatory Visit | Attending: Radiation Oncology | Admitting: Radiation Oncology

## 2011-02-07 ENCOUNTER — Encounter: Payer: Self-pay | Admitting: Radiation Oncology

## 2011-02-08 ENCOUNTER — Ambulatory Visit
Admission: RE | Admit: 2011-02-08 | Discharge: 2011-02-08 | Disposition: A | Discharge status: Home / Self Care | Payer: Medicare Other | Source: Ambulatory Visit | Attending: Radiation Oncology | Admitting: Radiation Oncology

## 2011-02-11 ENCOUNTER — Ambulatory Visit
Admission: RE | Admit: 2011-02-11 | Discharge: 2011-02-11 | Disposition: A | Discharge status: Home / Self Care | Payer: Medicare Other | Source: Ambulatory Visit | Attending: Radiation Oncology | Admitting: Radiation Oncology

## 2011-02-11 VITALS — Wt 127.5 lb

## 2011-02-11 DIAGNOSIS — C50419 Malignant neoplasm of upper-outer quadrant of unspecified female breast: Secondary | ICD-10-CM

## 2011-02-11 NOTE — Progress Notes (Signed)
Pt right breast slight erythema, no c/o pain in breast,soreness where surgeon removed lymph node under ar, stated pt 3:15 PM

## 2011-02-11 NOTE — Progress Notes (Signed)
Weekly Management Note:  Site:R Breast Current Dose:  4750  cGy Projected Dose: 5000  cGy  Narrative: The patient is seen today for routine under treatment assessment. CBCT/MVCT images/port films were reviewed. The chart was reviewed.   Slight discomfort along the lower axilla. She is using Radioplex gel.  Physical Examination: There were no vitals filed for this visit..  Weight: 127 lb 8 oz (57.834 kg). There is moderate erythema the skin with no areas of desquamation.  Impression: Tolerating radiation therapy well.  Plan: Continue radiation therapy as planned. She will finish her radiation therapy tomorrow and return to see me for a followup visit in one month.

## 2011-02-12 ENCOUNTER — Encounter: Payer: Self-pay | Admitting: Radiation Oncology

## 2011-02-12 ENCOUNTER — Ambulatory Visit
Admission: RE | Admit: 2011-02-12 | Discharge: 2011-02-12 | Disposition: A | Discharge status: Home / Self Care | Payer: Medicare Other | Source: Ambulatory Visit | Attending: Radiation Oncology | Admitting: Radiation Oncology

## 2011-02-12 ENCOUNTER — Encounter (INDEPENDENT_AMBULATORY_CARE_PROVIDER_SITE_OTHER): Payer: Self-pay | Admitting: Surgery

## 2011-02-12 NOTE — Progress Notes (Signed)
[facility] Radiation Oncology  Name: Jenna [name] MRN: [id number]  Date: 02/12/2011  DOB: 13-Jul-1931  Status:outpatient    CC: Lolita Patella, MD   Pierce Crane, MD,    Cicero Duck, MD  REFERRING PHYSICIAN:   Lolita Patella, MD   DIAGNOSIS: Stage I (T1, N0, M0) invasive ductal/DCIS of the right breast    INDICATION FOR TREATMENT: Curative   TREATMENT DATES: 01/15/2011 through 02/12/2011   SITE/DOSE:  Right breast 4250 cGy 17 sessions, right breast boost 750 cGy 3 sessions  BEAMS/ENERGY:  6 MV photons, tangential fields to the right breast. 15 MEV electrons right breast boost area   NARRATIVE:   The patient tolerated her radiation therapy well with no significant dermatitis by completion of therapy. She used Radioplex gel during her course of treatment.   PLAN: Routine followup in one month. Patient instructed to call if questions or worsening complaints in interim.

## 2011-03-02 ENCOUNTER — Telehealth: Payer: Self-pay | Admitting: Oncology

## 2011-03-02 NOTE — Telephone Encounter (Signed)
l/m and mailed appt infor 04/09/11 w/ dr Lillie Fragmin per mosaiq   aom

## 2011-03-15 ENCOUNTER — Encounter (INDEPENDENT_AMBULATORY_CARE_PROVIDER_SITE_OTHER): Payer: Self-pay | Admitting: Surgery

## 2011-03-15 ENCOUNTER — Ambulatory Visit (INDEPENDENT_AMBULATORY_CARE_PROVIDER_SITE_OTHER): Payer: Medicare Other | Admitting: Surgery

## 2011-03-15 VITALS — BP 120/80 | HR 68 | Temp 97.6°F | Resp 16 | Ht 59.0 in | Wt 125.6 lb

## 2011-03-15 DIAGNOSIS — C50419 Malignant neoplasm of upper-outer quadrant of unspecified female breast: Secondary | ICD-10-CM

## 2011-03-15 NOTE — Patient Instructions (Signed)
You need to be seen every six months for five years and continue to have annual mammograms. Come back sooner if you have any problems or concerns about your breast

## 2011-03-15 NOTE — Progress Notes (Signed)
NAME: BEV DRENNEN Cyr       DOB: [birth year]           DATE: 03/15/2011       MRN: [id number]   Jenna [name] is a 78 y.o.Marland Kitchenfemale who presents for routine followup of her Right breast cancer diagnosed in 2012 and treated with lumpectomy and radiation. She decided against any chemo. She has no problems or concerns on either side.Shr recently completed radiaton  PFSH: She has had no significant changes since the last visit here.  ROS: There have been no significant changes since the last visit here  EXAM: General: The patient is alert, oriented, generally healty appearing, NAD. Mood and affect are normal.  Breasts:  Minor radiation changes of Right breast a little red and a tiny bit of edema of the nipple areolar complex. The left breast is normal  Lymphatics: She has no axillary or supraclavicular adenopathy on either side.  Extremities: Full ROM of the surgical side with no lymphedema noted.  Data Reviewed: Notes from radiation  Impression: Doing well, with no evidence of recurrent cancer or new cancer  Plan: Will continue to follow up on a six month basis here

## 2011-03-19 ENCOUNTER — Encounter: Payer: Self-pay | Admitting: Radiation Oncology

## 2011-03-19 ENCOUNTER — Ambulatory Visit
Admission: RE | Admit: 2011-03-19 | Discharge: 2011-03-19 | Disposition: A | Discharge status: Home / Self Care | Payer: Medicare Other | Source: Ambulatory Visit | Attending: Radiation Oncology | Admitting: Radiation Oncology

## 2011-03-19 VITALS — BP 136/77 | HR 66 | Resp 18 | Wt 127.4 lb

## 2011-03-19 DIAGNOSIS — C50419 Malignant neoplasm of upper-outer quadrant of unspecified female breast: Secondary | ICD-10-CM

## 2011-03-19 NOTE — Progress Notes (Signed)
Patient presents to the clinic today accompanied by her daughter for a follow up appointment with Dr. Dayton Scrape. Patient is alert and oriented to person, place, and time. No distress noted. Steady gait noted. Pleasant affect noted. Patient denies pain at this time. Patient denies any hyperpigmentation or breaks in the skin of the treated breast. Encouraged patient to pick up OTC lotion with vitamin e and coco butter. Patient denies nausea, vomiting, diarrhea, constipation, headache or cough. Patient reports she is scheduled to follow up with Dr. Donnie Coffin 04/09/11. Patient reports a normal energy level. Reported all findings to Dr. Dayton Scrape.

## 2011-03-19 NOTE — Progress Notes (Signed)
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 She is without complaints today. She tells me she will see Dr. Jamey Ripa twice a year along with Dr. Pierce Crane. She was receptor negative and is not on any adjuvant hormone therapy. She is pleased with her cosmesis.  Physical examination: Head and neck examination: Grossly unremarkable. Nodes: No palpable cervical, supraclavicular, or axillary lymphadenopathy. Chest: Lungs clear. Breasts: There is residual hyperpigmentation of the skin along the right breast. There is residual induration deep to her lateral right breast post mastectomy scar as expected. No dominant masses are appreciated. Left breast without masses or lesions. Abdomen: Without hepatomegaly. Extremities: Without edema.   Impression: Satisfactory progress.  Plan: Dr. Jamey Ripa or Dr. Donnie Coffin can schedule her baseline right breast mammogram and screening left breast mammogram this spring or summer. I am comfortable waiting until July when she typically has her annual screening mammography. I've not scheduled the patient for a formal followup visit with me.

## 2011-04-09 ENCOUNTER — Ambulatory Visit (HOSPITAL_BASED_OUTPATIENT_CLINIC_OR_DEPARTMENT_OTHER): Payer: Medicare Other | Admitting: Oncology

## 2011-04-09 ENCOUNTER — Other Ambulatory Visit (HOSPITAL_BASED_OUTPATIENT_CLINIC_OR_DEPARTMENT_OTHER): Payer: Medicare Other | Admitting: Lab

## 2011-04-09 DIAGNOSIS — C50419 Malignant neoplasm of upper-outer quadrant of unspecified female breast: Secondary | ICD-10-CM

## 2011-04-09 DIAGNOSIS — C50919 Malignant neoplasm of unspecified site of unspecified female breast: Secondary | ICD-10-CM

## 2011-04-09 DIAGNOSIS — E559 Vitamin D deficiency, unspecified: Secondary | ICD-10-CM

## 2011-04-09 LAB — CBC WITH DIFFERENTIAL/PLATELET
BASO%: 0.3 % (ref 0.0–2.0)
HCT: 40.8 % (ref 34.8–46.6)
LYMPH%: 15.1 % (ref 14.0–49.7)
MCHC: 33.9 g/dL (ref 31.5–36.0)
MCV: 85.4 fL (ref 79.5–101.0)
MONO%: 4.9 % (ref 0.0–14.0)
NEUT%: 78 % — ABNORMAL HIGH (ref 38.4–76.8)
Platelets: 157 10*3/uL (ref 145–400)
RBC: 4.77 10*6/uL (ref 3.70–5.45)

## 2011-04-09 LAB — COMPREHENSIVE METABOLIC PANEL
Alkaline Phosphatase: 74 U/L (ref 39–117)
Creatinine, Ser: 0.85 mg/dL (ref 0.50–1.10)
Glucose, Bld: 99 mg/dL (ref 70–99)
Sodium: 141 mEq/L (ref 135–145)
Total Bilirubin: 0.6 mg/dL (ref 0.3–1.2)
Total Protein: 6.5 g/dL (ref 6.0–8.3)

## 2011-04-21 NOTE — Progress Notes (Signed)
Hematology and Oncology Follow Up Visit  Jenna [name] [id number] 1931/06/18 78 y.o. 04/21/2011 11:50 PM PCP  Principle Diagnosis: T1C N0 triple negative breast cancer s/p lumpectomy and xrt, completed 02/12/11.  Interim History:  There have been no intercurrent illness, hospitalizations or medication changes.  Medications: I have reviewed the patient's current medications.  Allergies:  Allergies  Allergen Reactions  . Iodine     Iodine IV  . Sulfa Antibiotics Rash    Past Medical History, Surgical history, Social history, and Family History were reviewed and updated.  Review of Systems: Constitutional:  Negative for fever, chills, night sweats, anorexia, weight loss, pain. Cardiovascular: no chest pain or dyspnea on exertion Respiratory: no cough, shortness of breath, or wheezing Neurological: negative Dermatological: negative ENT: negative Skin Gastrointestinal: no abdominal pain, change in bowel habits, or black or bloody stools Genito-Urinary: negative Hematological and Lymphatic: negative Breast: negative Musculoskeletal: negative Remaining ROS negative.  Physical Exam: Blood pressure 117/73, pulse 72, temperature 98.5 F (36.9 C), height 4\' 11"  (1.499 m), weight 127 lb (57.607 kg). ECOG: 0 General appearance: alert, cooperative and appears stated age Head: Normocephalic, without obvious abnormality, atraumatic Neck: no adenopathy, no carotid bruit, no JVD, supple, symmetrical, trachea midline and thyroid not enlarged, symmetric, no tenderness/mass/nodules Lymph nodes: Cervical, supraclavicular, and axillary nodes normal. Cardiac : regular rate and rhythm, no murmurs or gallops Pulmonary:clear to auscultation bilaterally and normal percussion bilaterally Breasts: inspection negative, no nipple discharge or bleeding, no masses or nodularity palpable Abdomen:soft, non-tender; bowel sounds normal; no masses,  no organomegaly Extremities negative Neuro: alert,  oriented, normal speech, no focal findings or movement disorder noted  Lab Results: Lab Results  Component Value Date   WBC 5.3 04/09/2011   HGB 13.8 04/09/2011   HCT 40.8 04/09/2011   MCV 85.4 04/09/2011   PLT 157 04/09/2011     Chemistry      Component Value Date/Time   NA 141 04/09/2011 1256   NA 139 11/07/2010 0825   K 3.6 04/09/2011 1256   K 3.2* 11/07/2010 0825   CL 103 04/09/2011 1256   CL 99 11/07/2010 0825   CO2 26 04/09/2011 1256   CO2 28 11/07/2010 0825   BUN 15 04/09/2011 1256   BUN 13 11/07/2010 0825   CREATININE 0.85 04/09/2011 1256   CREATININE 0.9 11/07/2010 0825      Component Value Date/Time   CALCIUM 9.4 04/09/2011 1256   CALCIUM 9.1 11/07/2010 0825   ALKPHOS 74 04/09/2011 1256   ALKPHOS 65 11/07/2010 0825   AST 22 04/09/2011 1256   AST 24 11/07/2010 0825   ALT 21 04/09/2011 1256   BILITOT 0.6 04/09/2011 1256   BILITOT 0.70 11/07/2010 0825      .pathology. Radiological Studies: chest X-ray n/a Mammogram n/a Bone density n/a  Impression and Plan: Ms [name] completed radiation and is doing well, I will see her in 6 months after she has had her f/u mammogram.  More than 50% of the visit was spent in patient-related counselling   Pierce Crane, MD 2/17/201311:50 PM

## 2011-09-12 ENCOUNTER — Encounter (INDEPENDENT_AMBULATORY_CARE_PROVIDER_SITE_OTHER): Payer: Self-pay | Admitting: Surgery

## 2011-09-12 ENCOUNTER — Ambulatory Visit (INDEPENDENT_AMBULATORY_CARE_PROVIDER_SITE_OTHER): Payer: Medicare Other | Admitting: Surgery

## 2011-09-12 VITALS — BP 112/64 | HR 80 | Resp 14 | Ht 59.5 in | Wt 121.0 lb

## 2011-09-12 DIAGNOSIS — Z853 Personal history of malignant neoplasm of breast: Secondary | ICD-10-CM

## 2011-09-12 NOTE — Patient Instructions (Signed)
When she had her mammogram this summer asked radiologist to send me a copy.  We should see you every 6 months until you get 5 years from surgery.

## 2011-09-12 NOTE — Progress Notes (Signed)
NAME: [name]       DOB: 1931-11-10           DATE: 09/12/2011       MRN: [id number]   Jenna [name] is a 78 y.o.Marland Kitchenfemale who presents for routine followup of her Stage I invasive ductal carcinoma of the right breast laterally diagnosed in 2012 and treated with Lumpectomy and radiation. She was unable to tolerate antiestrogen therapy. She has no problems or concerns on either side.  PFSH: She has had no significant changes since the last visit here.  ROS: There have been no significant changes since the last visit here  EXAM:  VS: BP 112/64  Pulse 80  Resp 14  Ht 4' 11.5" (1.511 m)  Wt 121 lb (54.885 kg)  BMI 24.03 kg/m2   General: The patient is alert, oriented, generally healthy appearing, NAD. Mood and affect are normal.  Breasts:  The right breast is a little bit smaller than the left. There is a little thickening of the scar at the lumpectomy site. The breast is otherwise unremarkable. The left breast is normal.  Lymphatics: She has no axillary or supraclavicular adenopathy on either side.  Extremities: Full ROM of the surgical side with no lymphedema noted.  Data Reviewed: Mammogram is scheduled for August  Impression: Doing well, with no evidence of recurrent cancer or new cancer  Plan: Will continue to follow up on a Six-month basis since she is not being followed by medical oncologist.

## 2011-10-03 ENCOUNTER — Telehealth (INDEPENDENT_AMBULATORY_CARE_PROVIDER_SITE_OTHER): Payer: Self-pay | Admitting: General Surgery

## 2011-10-03 NOTE — Telephone Encounter (Signed)
Patient aware they usually do a diagnostic study for 5 years post diagnosis. She will get her mammogram scheduled and let me know if they need an order for that.

## 2011-10-03 NOTE — Telephone Encounter (Signed)
Left message on machine for patient to call back and ask for me.   

## 2011-10-03 NOTE — Telephone Encounter (Signed)
Message copied by Liliana Cline on Thu Oct 03, 2011 12:13 PM ------      Message from: Zacarias Pontes      Created: Thu Oct 03, 2011 11:47 AM       Pt had br ca sx with [facility] and its time for mgm at [facility]...does she need diagnostic or regular mgm done..[phone]..thanks

## 2012-03-18 ENCOUNTER — Encounter (INDEPENDENT_AMBULATORY_CARE_PROVIDER_SITE_OTHER): Payer: Self-pay | Admitting: Surgery

## 2012-03-18 ENCOUNTER — Ambulatory Visit (INDEPENDENT_AMBULATORY_CARE_PROVIDER_SITE_OTHER): Payer: Medicare Other | Admitting: Surgery

## 2012-03-18 VITALS — BP 108/78 | HR 72 | Resp 16 | Ht 59.0 in | Wt 125.0 lb

## 2012-03-18 DIAGNOSIS — Z853 Personal history of malignant neoplasm of breast: Secondary | ICD-10-CM

## 2012-03-18 DIAGNOSIS — C50419 Malignant neoplasm of upper-outer quadrant of unspecified female breast: Secondary | ICD-10-CM

## 2012-03-18 NOTE — Progress Notes (Signed)
NAME: Jenna [name]       DOB: [birth year]           DATE: 03/18/2012       MRN: [id number]   Jenna [name] is a 79 y.o.Marland Kitchenfemale who presents for routine followup of her Stage I, receptor negative,  invasive ductal carcinoma of the right breast laterally diagnosed in 2012 and treated with Lumpectomy and radiation. She declined chemo. She has no problems or concerns on either side.  PFSH: She has had no significant changes since the last visit here.  ROS: There have been no significant changes since the last visit here  EXAM:  VS: BP 108/78  Pulse 72  Resp 16  Ht 4\' 11"  (1.499 m)  Wt 125 lb (56.7 kg)  BMI 25.25 kg/m2   General: The patient is alert, oriented, generally healthy appearing, NAD. Mood and affect are normal.  Breasts:  The right breast is a little bit smaller than the left. There is a little thickening of the scar at the lumpectomy site. The breast is otherwise unremarkable. The left breast is normal.  Lymphatics: She has no axillary or supraclavicular adenopathy on either side.  Extremities: Full ROM of the surgical side with no lymphedema noted.  Data Reviewed: Mammogram i@[facility]  10/30/2011 was negative  Impression: Doing well, with no evidence of recurrent cancer or new cancer  Plan: Will continue to follow up on a Six-month basis since she is not being followed by medical oncologist.

## 2012-03-18 NOTE — Patient Instructions (Signed)
Continue annual mammograms, see me again in about 6 months

## 2012-03-25 ENCOUNTER — Telehealth: Payer: Self-pay | Admitting: Oncology

## 2012-03-25 NOTE — Telephone Encounter (Signed)
Pt called.  She is sad the Dr. Donnie Coffin is no longer here.   She is anxious to be scheduled with a new doctor.   SHe would like to see Dr. Darnelle Catalan.  I emailed Trey Paula Heffelfinger her request and assured her that our schedulers will be calling her soon with a new appt.

## 2012-03-25 NOTE — Telephone Encounter (Signed)
Pt called asking for an appt with Dr. Darnelle Catalan as she received the letter stating that Dr. Donnie Coffin is no longer with [facility].   I emailed Jule Ser letting him know of her request.  She will be call 03/28/12 by a scheduler to establish a follow up.

## 2012-03-31 ENCOUNTER — Encounter: Payer: Self-pay | Admitting: *Deleted

## 2012-03-31 NOTE — Progress Notes (Unsigned)
Called and spoke with patient about rescheduling her appt.  Offered an appt. With advanced practice provider, but patient is requesting to see Dr. Darnelle Catalan only.  Confirmed appt. For 05/18/12 at 2pm.

## 2012-04-01 ENCOUNTER — Encounter: Payer: Self-pay | Admitting: Oncology

## 2012-04-08 ENCOUNTER — Encounter (INDEPENDENT_AMBULATORY_CARE_PROVIDER_SITE_OTHER): Payer: Self-pay

## 2012-04-09 ENCOUNTER — Other Ambulatory Visit: Payer: Medicare Other | Admitting: Lab

## 2012-04-09 ENCOUNTER — Ambulatory Visit: Payer: Medicare Other | Admitting: Oncology

## 2012-05-18 ENCOUNTER — Telehealth: Payer: Self-pay | Admitting: *Deleted

## 2012-05-18 ENCOUNTER — Ambulatory Visit: Payer: Medicare Other | Admitting: Oncology

## 2012-05-18 ENCOUNTER — Telehealth: Payer: Self-pay | Admitting: Oncology

## 2012-05-18 NOTE — Telephone Encounter (Signed)
Pt called wanting to get her appt rescheduled and I confirmed 05/28/12 appt w/ pt.

## 2012-05-28 ENCOUNTER — Telehealth: Payer: Self-pay | Admitting: *Deleted

## 2012-05-28 ENCOUNTER — Ambulatory Visit: Payer: Medicare Other | Admitting: Family

## 2012-05-28 NOTE — Telephone Encounter (Signed)
LM of pts voicemail at home to let her know that Jenna [name] is sick and we cannot see her today and we need to reschedule.  Requested for her to call me asap.

## 2012-05-28 NOTE — Telephone Encounter (Signed)
Pt returned my call and I confirmed 06/08/12 appt w/ pt.

## 2012-06-08 ENCOUNTER — Ambulatory Visit (HOSPITAL_BASED_OUTPATIENT_CLINIC_OR_DEPARTMENT_OTHER): Payer: Medicare Other | Admitting: Family

## 2012-06-08 ENCOUNTER — Telehealth: Payer: Self-pay | Admitting: Oncology

## 2012-06-08 ENCOUNTER — Encounter: Payer: Self-pay | Admitting: Family

## 2012-06-08 VITALS — BP 138/81 | HR 64 | Temp 98.2°F | Resp 20 | Ht 59.0 in | Wt 124.2 lb

## 2012-06-08 DIAGNOSIS — C50411 Malignant neoplasm of upper-outer quadrant of right female breast: Secondary | ICD-10-CM

## 2012-06-08 DIAGNOSIS — C50419 Malignant neoplasm of upper-outer quadrant of unspecified female breast: Secondary | ICD-10-CM

## 2012-06-08 DIAGNOSIS — Z171 Estrogen receptor negative status [ER-]: Secondary | ICD-10-CM

## 2012-06-08 NOTE — Patient Instructions (Addendum)
Please contact us at [phone] if you have any questions or concerns.  Eat a healthy diet, drink plenty of water, exercise daily and get plenty of rest

## 2012-06-08 NOTE — Progress Notes (Signed)
[facility]  Telephone:([phone] [phone]  OFFICE PROGRESS NOTE   ID: SANII KUKLA   DOB: 1931-10-15  MR#: [id number]  JYN#:[id number]   PCP: Lolita Patella, MD SU: Currie Paris, MD RAD ONC: Chipper Herb, MD   HISTORY OF PRESENT ILLNESS: From Dr. Guadalupe Maple New Patient Evaluation Note dated 11/07/2010: "This is a delightful 79 year old woman from [facility] referred by Dr. Molly Maduro Read for evaluation of her recently diagnosed breast cancer.   Ms. Stogner is here today in the multidisciplinary clinic.  She is accompanied by her daughter, Alvino Chapel, and son, Dorene Sorrow.  Ms. Mccormack has been in good health.  She undergoes annual screening mammography.  She had a screening mammogram on 10/19/2009 and this showed no abnormalities.  A follow up mammogram on 10/25/2010 showed an asymmetric density developing in the upper outer quadrant of the right breast.  No new abnormalities were seen.  Follow up views with ultrasound of the right breast was performed on 11/01/2010 and this confirmed the presence of a 1- x 1.3- x 1.5-cm irregular marginated nodular density with posterior acoustic shadowing.  The patient had a biopsy performed the same day.  This showed a moderately well-differentiated invasive ductal cancer.  This appeared to be ER and PR negative.  HER-2 at this point is still pending.  An MRI scan has been scheduled."  Her subsequent history is as detailed below.   INTERVAL HISTORY: Dr. Darnelle Catalan and I saw him Ms. Linebaugh today for follow up of triple-negative invasive ductal carcinoma with DCIS of the right breast.  Since her last office visit with Dr. Donnie Coffin on 04/09/2011, the patient states that she has been doing fairly well.  Ms. Eimer is establishing herself with Dr. Darrall Dears service today.  REVIEW OF SYSTEMS: A 10 point review of systems was completed and is negative except for complaints of intermittent constipation.  The patient  denies any other symptomatology.   PAST MEDICAL HISTORY: Past Medical History  Diagnosis Date  . Hypercholesteremia   . Anxiety   . Osteoporosis   . Breast cancer, IDC, Right, Stage I 11/07/2010  . Constipation     PAST SURGICAL HISTORY: Past Surgical History  Procedure Laterality Date  . Breast lumpectomy w/ needle localization  11/26/2010    Right - Dr Jamey Ripa  . Tumor removal  1960, 1970    benign tumor from bilateral breast  . Hemorrhoid surgery    . Tonsillectomy      FAMILY HISTORY Family History  Problem Relation Age of Onset  . Cancer Mother     lung  . Heart disease Mother   . Heart disease Father   . Heart disease Brother   . Heart disease Paternal Uncle   . Heart disease Maternal Grandmother   . Stroke Maternal Grandfather   . Heart disease Paternal Grandfather     GYNECOLOGIC HISTORY: She is G2, P2, menarche at age 59, menopause in 68.  Hormone replacement therapy taken for about 4 years.  SOCIAL HISTORY: Ms. Passmore is originally from [place], [place] and moved here when her husband (who is from [place], [place]) married her.  Her husband died when he was 70.  She has been widowed since.  Lives by herself.  Son, Dorene Sorrow, lives in [place].  Daughter, Alvino Chapel, has 4 children and lives in [place].  She is very active socially in the [place] community. 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  The patient lives alone  since and was a widow at the age of 58.  The patient enjoys Silver sneakers exercise class, playing cards with her friends, and in painting with watercolors in her spare time.  ADVANCED DIRECTIVES: Not on file  HEALTH MAINTENANCE: History  Substance Use Topics  . Smoking status: Never Smoker   . Smokeless tobacco: Never Used  . Alcohol Use: No    Colonoscopy: None file PAP: 09/20/2010 Bone density: Bone density examination is due at this time Lipid panel: Not on file  Allergies  Allergen  Reactions  . Iodine     Iodine IV  . Sulfa Antibiotics Rash    Current Outpatient Prescriptions  Medication Sig Dispense Refill  . aspirin 81 MG tablet Take 81 mg by mouth daily.       Marland Kitchen atenolol (TENORMIN) 100 MG tablet 50 mg.       . atorvastatin (LIPITOR) 40 MG tablet Take 40 mg by mouth daily.       . cholecalciferol (VITAMIN D) 1000 UNITS tablet Take 1,000 Units by mouth daily.        Marland Kitchen LORazepam (ATIVAN) 1 MG tablet Take 1 mg by mouth every 8 (eight) hours as needed.       Marland Kitchen losartan-hydrochlorothiazide (HYZAAR) 50-12.5 MG per tablet Take 1 tablet by mouth daily.       Marland Kitchen senna (SENOKOT) 8.6 MG TABS Take 2 tablets by mouth at bedtime.       . sertraline (ZOLOFT) 100 MG tablet 50 mg 1 day or 1 dose.        No current facility-administered medications for this visit.    OBJECTIVE: Filed Vitals:   06/08/12 1102  BP: 138/81  Pulse: 64  Temp: 98.2 F (36.8 C)  Resp: 20     Body mass index is 25.07 kg/(m^2).      ECOG FS: Grade 1 - Symptomatic, but fully ambulatory                 General appearance: Alert, cooperative, well nourished, no apparent distress Head: Normocephalic, without obvious abnormality, atraumatic Eyes: Arcus senilis, PERRLA, EOMI Nose: Nares, septum and mucosa are normal, no drainage or sinus tenderness Neck: No adenopathy, supple, symmetrical, trachea midline, thyroid not enlarged, no tenderness Resp: Clear to auscultation bilaterally Cardio: Regular rate and rhythm, S1, S2 normal, no murmur, click, rub or gallop Breasts: Right breast and axillary area have well-healed surgical scars, left breast has a well-healed scar, no lymphadenopathy, no nipple inversion, no axilla fullness GI: Soft, distended, non-tender, hypoactive bowel sounds, no organomegaly Extremities: Extremities normal, atraumatic, no cyanosis or edema Lymph nodes: Cervical, supraclavicular, and axillary nodes normal Neurologic: Grossly normal Skeletal: Prominent spine curvature  LAB  RESULTS: Lab Results  Component Value Date   WBC 5.3 04/09/2011   NEUTROABS 4.1 04/09/2011   HGB 13.8 04/09/2011   HCT 40.8 04/09/2011   MCV 85.4 04/09/2011   PLT 157 04/09/2011      Chemistry      Component Value Date/Time   NA 141 04/09/2011 1256   NA 139 11/07/2010 0825   K 3.6 04/09/2011 1256   K 3.2* 11/07/2010 0825   CL 103 04/09/2011 1256   CL 99 11/07/2010 0825   CO2 26 04/09/2011 1256   CO2 28 11/07/2010 0825   BUN 15 04/09/2011 1256   BUN 13 11/07/2010 0825   CREATININE 0.85 04/09/2011 1256   CREATININE 0.9 11/07/2010 0825      Component Value Date/Time   CALCIUM 9.4 04/09/2011 1256  CALCIUM 9.1 11/07/2010 0825   ALKPHOS 74 04/09/2011 1256   ALKPHOS 65 11/07/2010 0825   AST 22 04/09/2011 1256   AST 24 11/07/2010 0825   ALT 21 04/09/2011 1256   BILITOT 0.6 04/09/2011 1256   BILITOT 0.70 11/07/2010 0825       Lab Results  Component Value Date   LABCA2 25 11/07/2010    Urinalysis No results found for this basename: colorurine,  appearanceur,  labspec,  phurine,  glucoseu,  hgbur,  bilirubinur,  ketonesur,  proteinur,  urobilinogen,  nitrite,  leukocytesur    STUDIES: No results found.  ASSESSMENT: 79 y.[place]. [place]: 1.  Status post right breast upper outer quadrant needle core biopsy on 11/01/2010 which showed a well to moderately differentiated invasive ductal carcinoma, grade 1.  2.  Status post right breast lumpectomy with sentinel node biopsy for a ,T1c N0,  triple-negative invasive ductal carcinoma with DCIS and lymphovascular invasion,  1.5 cm, 0/4 positive lymph nodes, Ki-67 35%.  3.  Status post radiation therapy from 01/15/2011 through 02/12/2011.  4.  Status post genetic testing with [facility] Breast Cancer Genomic Profile - breast cancer recurrence assay 70 gene signature prognostic and predictive tumor analysis shows high risk with a 10 year distant metastasis-free survival prior to treatment 71%: These patients can expect their risk to be reduced with  adjuvant chemotherapy.  Of note, the patient declined adjuvant chemotherapy.  5.  Constipation  PLAN: Dr. Darnelle Catalan not stated we will follow the patient every 6 months until she has reached 5 years from her original diagnosis (2017).  The patient states that she will forward to [place] the laboratory results obtained by her primary care physician and the bone density scan/mammogram results from [facility].  The patient was asked to exercise daily, consume a healthy diet, and drink plenty of water which may help with her constipation.  We plan to see the patient again in 6 months.  All questions were answered.  The patient was encouraged to contact us in the interim with any problems, questions or concerns.   Larina Bras, NP-C 06/10/2012, 5:27 PM

## 2012-06-23 ENCOUNTER — Other Ambulatory Visit: Payer: Self-pay | Admitting: Gastroenterology

## 2012-07-20 ENCOUNTER — Other Ambulatory Visit: Payer: Self-pay | Admitting: Oncology

## 2012-09-18 ENCOUNTER — Ambulatory Visit (INDEPENDENT_AMBULATORY_CARE_PROVIDER_SITE_OTHER): Payer: Medicare Other | Admitting: Surgery

## 2012-09-18 ENCOUNTER — Encounter (INDEPENDENT_AMBULATORY_CARE_PROVIDER_SITE_OTHER): Payer: Self-pay | Admitting: Surgery

## 2012-09-18 VITALS — BP 110/66 | HR 64 | Resp 14 | Ht 59.0 in | Wt 122.0 lb

## 2012-09-18 DIAGNOSIS — Z853 Personal history of malignant neoplasm of breast: Secondary | ICD-10-CM

## 2012-09-18 NOTE — Progress Notes (Signed)
NAME: Jenna [name]       DOB: 1931/04/13           DATE: 09/18/2012       MRN: [id number]   Jenna [name] is a 79 y.o.Marland Kitchenfemale who presents for routine followup of her Stage I, receptor negative,  invasive ductal carcinoma of the right breast laterally diagnosed in 2012 and treated with Lumpectomy and radiation. She declined chemo. She has no problems or concerns on either side.  PFSH: She has had no significant changes since the last visit here.  ROS: There have been no significant changes since the last visit here  EXAM:  VS: BP 110/66  Pulse 64  Resp 14  Ht 4\' 11"  (1.499 m)  Wt 122 lb (55.339 kg)  BMI 24.63 kg/m2   General: The patient is alert, oriented, generally healthy appearing, NAD. Mood and affect are normal.  Breasts:  The right breast is a little bit smaller than the left. There is a little thickening of the scar with a little retraction, don't know if this is a change. There is a relatively hard area just superior to the scar. The breast is otherwise unremarkable. The left breast is normal.  Lymphatics: She has no axillary or supraclavicular adenopathy on either side.  Extremities: Full ROM of the surgical side with no lymphedema noted.  Data Reviewed: Mammogram @ [facility] not yet done, due soon  Impression: Doing well, with no evidence of recurrent cancer, but will need to check mammogram to se if anything at lumpectomy site  Plan: RTC one year,she will have a mammogram reports sent here.

## 2012-09-18 NOTE — Patient Instructions (Signed)
Have the mammogram report in August sent to me please Continue annual followups

## 2012-10-06 ENCOUNTER — Other Ambulatory Visit: Payer: Self-pay | Admitting: Family Medicine

## 2012-10-06 ENCOUNTER — Other Ambulatory Visit (HOSPITAL_COMMUNITY)
Admission: RE | Admit: 2012-10-06 | Discharge: 2012-10-06 | Disposition: A | Discharge status: Home / Self Care | Payer: Medicare Other | Source: Ambulatory Visit | Attending: Family Medicine | Admitting: Family Medicine

## 2012-10-06 DIAGNOSIS — Z124 Encounter for screening for malignant neoplasm of cervix: Secondary | ICD-10-CM | POA: Insufficient documentation

## 2012-11-05 ENCOUNTER — Encounter: Payer: Self-pay | Admitting: Oncology

## 2012-12-29 ENCOUNTER — Telehealth: Payer: Self-pay | Admitting: Oncology

## 2012-12-29 ENCOUNTER — Ambulatory Visit (HOSPITAL_BASED_OUTPATIENT_CLINIC_OR_DEPARTMENT_OTHER): Payer: Medicare Other | Admitting: Oncology

## 2012-12-29 VITALS — BP 153/76 | HR 62 | Temp 98.1°F | Resp 18 | Ht 59.0 in | Wt 124.9 lb

## 2012-12-29 DIAGNOSIS — C50411 Malignant neoplasm of upper-outer quadrant of right female breast: Secondary | ICD-10-CM

## 2012-12-29 DIAGNOSIS — Z171 Estrogen receptor negative status [ER-]: Secondary | ICD-10-CM

## 2012-12-29 DIAGNOSIS — C50419 Malignant neoplasm of upper-outer quadrant of unspecified female breast: Secondary | ICD-10-CM

## 2012-12-29 NOTE — Telephone Encounter (Signed)
, °

## 2012-12-29 NOTE — Progress Notes (Signed)
[facility]  Telephone:(336) [phone] Fax:(336) [phone]  OFFICE PROGRESS NOTE   ID: Jenna [name]   DOB: [birth year]  MR#: [id number]  AOZ#:[id number]   PCP: Jenna [name] SU: Jenna Paris, [name] RAD ONC: Jenna [name]   HISTORY OF PRESENT ILLNESS: From Jenna [name] New Patient Evaluation Note dated 11/07/2010:  "This is a delightful 79 year old [name] from [facility] referred by Jenna [name] for evaluation of her recently diagnosed breast cancer.   Jenna [name] is here today in the multidisciplinary clinic.  She is accompanied by her daughter, Jenna [name], and son, Jenna [name].  Jenna [name] has been in good health.  She undergoes annual screening mammography.  She had a screening mammogram on 10/19/2009 and this showed no abnormalities.  A follow up mammogram on 10/25/2010 showed an asymmetric density developing in the upper outer quadrant of the right breast.  No new abnormalities were seen.  Follow up views with ultrasound of the right breast was performed on 11/01/2010 and this confirmed the presence of a 1- x 1.3- x 1.5-cm irregular marginated nodular density with posterior acoustic shadowing.  The patient had a biopsy performed the same day.  This showed a moderately well-differentiated invasive ductal cancer.  This appeared to be ER and PR negative.  HER-2 at this point is still pending.  An MRI scan has been scheduled."    Her subsequent history is as detailed below.   INTERVAL HISTORY: Jenna [name] returns today for followup of her breast cancer. Interval history is generally unremarkable. She goes to the gym twice a week, she plays cards 2 days a week, she goes to our class and classes at the [employer] twice a week and on Saturday she goes up with friends to supper and a movie. Her 4 grandchildren are "extraordinary". In general family is doing fine   REVIEW OF SYSTEMS: She was very concerned because Dr. Jamey [name] found or felt a slight abnormality  in her right breast however mammography showed this to be only scar tissue. She is chronically constipated but is managing that well with stool softeners. A detailed review of systems today was otherwise entirely negative   PAST MEDICAL HISTORY: Past Medical History  Diagnosis Date  . Hypercholesteremia   . Anxiety   . Osteoporosis   . Breast cancer, IDC, Right, Stage I 11/07/2010  . Constipation     PAST SURGICAL HISTORY: Past Surgical History  Procedure Laterality Date  . Breast lumpectomy w/ needle localization  11/26/2010    Right - Dr Jenna [name]  . Tumor removal  1960, 1970    benign tumor from bilateral breast  . Hemorrhoid surgery    . Tonsillectomy      FAMILY HISTORY Family History  Problem Relation Age of Onset  . Cancer Mother     lung  . Heart disease Mother   . Heart disease Father   . Heart disease Brother   . Heart disease Paternal Uncle   . Heart disease Maternal Grandmother   . Stroke Maternal Grandfather   . Heart disease Paternal Grandfather     GYNECOLOGIC HISTORY: She is G2, P2, menarche at age 28, menopause in 4.  Hormone replacement therapy taken for about 4 years.  SOCIAL HISTORY: Jenna [name] is originally from [name] and moved here when her husband (who was from [place], [place]) married her.  He died when he was 52.  She now lives by herself.  Son, Jenna [name], lives in [place].  Daughter, Jenna [name], has  4 children and lives in [place].  The patient is very active socially in the [place] community. 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    ADVANCED DIRECTIVES:   in place  Health maintenance:  History  Substance Use Topics  . Smoking status: Never Smoker   . Smokeless tobacco: Never Used  . Alcohol Use: No    Colonoscopy: April 2014 PAP: 09/20/2010 Bone density:  Lipid panel: Not on file  Allergies  Allergen Reactions  . Iodine     Iodine IV  . Sulfa Antibiotics Rash    Current  Outpatient Prescriptions  Medication Sig Dispense Refill  . aspirin 81 MG tablet Take 81 mg by mouth daily.       Marland Kitchen atenolol (TENORMIN) 100 MG tablet 50 mg.       . atorvastatin (LIPITOR) 40 MG tablet Take 40 mg by mouth daily.       . cholecalciferol (VITAMIN D) 1000 UNITS tablet Take 1,000 Units by mouth daily.        Marland Kitchen LORazepam (ATIVAN) 1 MG tablet Take 1 mg by mouth every 8 (eight) hours as needed.       Marland Kitchen losartan-hydrochlorothiazide (HYZAAR) 50-12.5 MG per tablet Take 1 tablet by mouth daily.       Marland Kitchen senna (SENOKOT) 8.6 MG TABS Take 2 tablets by mouth at bedtime.       . sertraline (ZOLOFT) 100 MG tablet 50 mg 1 day or 1 dose.        No current facility-administered medications for this visit.    OBJECTIVE: Older [name] who appears younger than stated age 79 Vitals:   12/29/12 0944  BP: 153/76  Pulse: 62  Temp: 98.1 F (36.7 C)  Resp: 18     Body mass index is 25.21 kg/(m^2).      ECOG FS: 0            Sclerae unicteric, pupils equal round and reactive Oropharynx no thrush or other lesions No cervical or supraclavicular adenopathy Lungs no rales or rhonchi Heart regular rate and rhythm Abd soft, nontender, positive bowel sounds MSK no focal spinal tenderness, no peripheral edema Neuro: nonfocal, well oriented, positive affect Breasts: The right breast is status post lumpectomy and radiation. The scars in the lateral superior aspect of the breast. There are no suspicious findings. The right axilla is benign. Left breast is unremarkable   LAB RESULTS: Lab Results  Component Value Date   WBC 5.3 04/09/2011   NEUTROABS 4.1 04/09/2011   HGB 13.8 04/09/2011   HCT 40.8 04/09/2011   MCV 85.4 04/09/2011   PLT 157 04/09/2011      Chemistry      Component Value Date/Time   NA 141 04/09/2011 1256   NA 139 11/07/2010 0825   K 3.6 04/09/2011 1256   K 3.2* 11/07/2010 0825   CL 103 04/09/2011 1256   CL 99 11/07/2010 0825   CO2 26 04/09/2011 1256   CO2 28 11/07/2010 0825   BUN 15 04/09/2011 1256    BUN 13 11/07/2010 0825   CREATININE 0.85 04/09/2011 1256   CREATININE 0.9 11/07/2010 0825      Component Value Date/Time   CALCIUM 9.4 04/09/2011 1256   CALCIUM 9.1 11/07/2010 0825   ALKPHOS 74 04/09/2011 1256   ALKPHOS 65 11/07/2010 0825   AST 22 04/09/2011 1256   AST 24 11/07/2010 0825   ALT 21 04/09/2011 1256   ALT 26 11/07/2010 0825  BILITOT 0.6 04/09/2011 1256   BILITOT 0.70 11/07/2010 0825       Lab Results  Component Value Date   LABCA2 25 11/07/2010    Urinalysis No results found for this basename: colorurine,  appearanceur,  labspec,  phurine,  glucoseu,  hgbur,  bilirubinur,  ketonesur,  proteinur,  urobilinogen,  nitrite,  leukocytesur    STUDIES:  August 2014 right breast mammogram and ultrasonography reviewed   ASSESSMENT: 79 y.o. Jenna [name]:  1.  Status post right breast upper outer quadrant lumpectomy with sentinel node biopsy 11/26/2010 for a pT1c pN0, stage IA invasive ductal carcinoma, grade 1, triple-negative, Ki-67 35%.  2.  Status post radiation therapy from 01/15/2011 through 02/12/2011.  3.  Status post genetic testing with [facility] Personalized Breast Cancer Genomic Profile - breast cancer recurrence assay 70 gene signature prognostic and predictive tumor analysis shows high risk with a 10 year distant metastasis-free survival prior to treatment 71% (baseline risk or distant metastases 29%): These patients can expect their risk to be reduced with adjuvant chemotherapy.  However, the patient declined adjuvant chemotherapy.   PLAN: Jenna [name] doing fine from a breast cancer point of view. She sees her primary care doctor, Dr. Nicholos Johns, in January and July. Accordingly she will see [place] in April and October. She is due for repeat mammography in December 2014. We have requested a copy of your most recent bone density from [place] to review  Thresia knows to call for any problems that may develop before her next visit here.  Lowella Dell, [name]   12/29/2012, 10:11 AM

## 2013-02-23 ENCOUNTER — Encounter (INDEPENDENT_AMBULATORY_CARE_PROVIDER_SITE_OTHER): Payer: Self-pay

## 2013-06-01 ENCOUNTER — Other Ambulatory Visit: Payer: Self-pay | Admitting: Physician Assistant

## 2013-06-01 ENCOUNTER — Telehealth: Payer: Self-pay | Admitting: Oncology

## 2013-06-01 NOTE — Telephone Encounter (Signed)
lmonvm advising the pt of the r/s appts from 06/29/2013 to 07/06/2013.

## 2013-06-29 ENCOUNTER — Other Ambulatory Visit: Payer: Medicare Other

## 2013-06-29 ENCOUNTER — Ambulatory Visit: Payer: Medicare Other | Admitting: Physician Assistant

## 2013-07-06 ENCOUNTER — Other Ambulatory Visit: Payer: Medicare Other

## 2013-07-06 ENCOUNTER — Ambulatory Visit: Payer: Medicare Other

## 2013-12-07 ENCOUNTER — Ambulatory Visit (HOSPITAL_BASED_OUTPATIENT_CLINIC_OR_DEPARTMENT_OTHER): Payer: Medicare Other | Admitting: Oncology

## 2013-12-07 ENCOUNTER — Telehealth: Payer: Self-pay | Admitting: Oncology

## 2013-12-07 ENCOUNTER — Other Ambulatory Visit (HOSPITAL_BASED_OUTPATIENT_CLINIC_OR_DEPARTMENT_OTHER): Payer: Medicare Other

## 2013-12-07 VITALS — BP 143/78 | HR 61 | Temp 98.3°F | Resp 18 | Ht 59.0 in | Wt 122.8 lb

## 2013-12-07 DIAGNOSIS — C50411 Malignant neoplasm of upper-outer quadrant of right female breast: Secondary | ICD-10-CM

## 2013-12-07 DIAGNOSIS — Z171 Estrogen receptor negative status [ER-]: Secondary | ICD-10-CM

## 2013-12-07 LAB — COMPREHENSIVE METABOLIC PANEL (CC13)
ALBUMIN: 3.9 g/dL (ref 3.5–5.0)
ALT: 23 U/L (ref 0–55)
AST: 19 U/L (ref 5–34)
Alkaline Phosphatase: 76 U/L (ref 40–150)
Anion Gap: 9 mEq/L (ref 3–11)
BUN: 14.9 mg/dL (ref 7.0–26.0)
CALCIUM: 10 mg/dL (ref 8.4–10.4)
CHLORIDE: 106 meq/L (ref 98–109)
CO2: 28 mEq/L (ref 22–29)
Creatinine: 0.9 mg/dL (ref 0.6–1.1)
Glucose: 93 mg/dl (ref 70–140)
POTASSIUM: 3.2 meq/L — AB (ref 3.5–5.1)
Sodium: 143 mEq/L (ref 136–145)
TOTAL PROTEIN: 7.5 g/dL (ref 6.4–8.3)
Total Bilirubin: 0.58 mg/dL (ref 0.20–1.20)

## 2013-12-07 LAB — CBC WITH DIFFERENTIAL/PLATELET
BASO%: 1.1 % (ref 0.0–2.0)
Basophils Absolute: 0.1 10*3/uL (ref 0.0–0.1)
EOS%: 2.7 % (ref 0.0–7.0)
Eosinophils Absolute: 0.1 10*3/uL (ref 0.0–0.5)
HCT: 44.8 % (ref 34.8–46.6)
HEMOGLOBIN: 14.6 g/dL (ref 11.6–15.9)
LYMPH#: 1.1 10*3/uL (ref 0.9–3.3)
LYMPH%: 21.4 % (ref 14.0–49.7)
MCH: 27.9 pg (ref 25.1–34.0)
MCHC: 32.6 g/dL (ref 31.5–36.0)
MCV: 85.5 fL (ref 79.5–101.0)
MONO#: 0.4 10*3/uL (ref 0.1–0.9)
MONO%: 8.2 % (ref 0.0–14.0)
NEUT#: 3.3 10*3/uL (ref 1.5–6.5)
NEUT%: 66.6 % (ref 38.4–76.8)
Platelets: 168 10*3/uL (ref 145–400)
RBC: 5.24 10*6/uL (ref 3.70–5.45)
RDW: 14.6 % — AB (ref 11.2–14.5)
WBC: 5 10*3/uL (ref 3.9–10.3)

## 2013-12-07 MED ORDER — POTASSIUM CHLORIDE CRYS ER 20 MEQ PO TBCR: 20.0000 meq | EXTENDED_RELEASE_TABLET | Freq: Once | ORAL | Status: DC

## 2013-12-07 NOTE — Progress Notes (Signed)
[place]  Telephone:(336) [phone] Fax:(336) [phone]  OFFICE PROGRESS NOTE   ID: Jenna [name]   DOB: 1931-11-22  MR#: [id number]  JJO#:[id number]   PCP: Vena Austria, MD SU: Haywood Lasso, MD RAD ONC: Arloa Koh, MD   HISTORY OF PRESENT ILLNESS: From Dr. Dewaine Conger New Patient Evaluation Note dated 11/07/2010:  "This is a delightful 80 year old woman from [facility] referred by Dr. Herbie Baltimore Read for evaluation of her recently diagnosed breast cancer.   Jenna [name] is here today in the multidisciplinary clinic.  She is accompanied by her daughter, Jenna [name], and son, Jenna [name].  Jenna [name] has been in good health.  She undergoes annual screening mammography.  She had a screening mammogram on 10/19/2009 and this showed no abnormalities.  A follow up mammogram on 10/25/2010 showed an asymmetric density developing in the upper outer quadrant of the right breast.  No new abnormalities were seen.  Follow up views with ultrasound of the right breast was performed on 11/01/2010 and this confirmed the presence of a 1- x 1.3- x 1.5-cm irregular marginated nodular density with posterior acoustic shadowing.  The patient had a biopsy performed the same day.  This showed a moderately well-differentiated invasive ductal cancer.  This appeared to be ER and PR negative.  HER-2 at this point is still pending.  An MRI scan has been scheduled."    Her subsequent history is as detailed below.   INTERVAL HISTORY: Jenna [name] returns today for followup of her breast cancer. The interval history is significant for her having become a great-grandmother. The girl lives in [place] and she has already seen her.  REVIEW OF SYSTEMS: Jenna [name] "feels good". When asked what her worse problem is, she doesn't have 1. She exercises twice a week at the [facility]. She takes an Engineer, site class. She has problems with hearing loss and minimal urinary stress incontinence. A detailed review of systems  today was otherwise noncontributory  PAST MEDICAL HISTORY: Past Medical History  Diagnosis Date  . Hypercholesteremia   . Anxiety   . Osteoporosis   . Breast cancer, IDC, Right, Stage I 11/07/2010  . Constipation     PAST SURGICAL HISTORY: Past Surgical History  Procedure Laterality Date  . Breast lumpectomy w/ needle localization  11/26/2010    Right - Dr Margot Chimes  . Tumor removal  1960, 1970    benign tumor from bilateral breast  . Hemorrhoid surgery    . Tonsillectomy      FAMILY HISTORY Family History  Problem Relation Age of Onset  . Cancer Mother     lung  . Heart disease Mother   . Heart disease Father   . Heart disease Brother   . Heart disease Paternal Uncle   . Heart disease Maternal Grandmother   . Stroke Maternal Grandfather   . Heart disease Paternal Grandfather     GYNECOLOGIC HISTORY: She is G2, P2, menarche at age 46, menopause in 71.  Hormone replacement therapy taken for about 4 years.  SOCIAL HISTORY: Jenna [name] is originally from [place], [place] and moved here when her husband (who was from [place], [place]) married her.  He died when he was 22.  She now lives by herself.  Son, Jenna [name], lives in [place].  Daughter, Jenna [name], has 4 children and lives in [place].  The patient is very active socially in the [place]. 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    ADVANCED DIRECTIVES:  in  place  Health maintenance:  History  Substance Use Topics  . Smoking status: Never Smoker   . Smokeless tobacco: Never Used  . Alcohol Use: No    Colonoscopy: April 2014 PAP: 09/20/2010 Bone density:  Lipid panel: Not on file  Allergies  Allergen Reactions  . Iodine     Iodine IV  . Sulfa Antibiotics Rash    Current Outpatient Prescriptions  Medication Sig Dispense Refill  . aspirin 81 MG tablet Take 81 mg by mouth daily.       Marland Kitchen atenolol (TENORMIN) 100 MG tablet 50 mg.       . atorvastatin (LIPITOR) 40  MG tablet Take 40 mg by mouth daily.       . cholecalciferol (VITAMIN D) 1000 UNITS tablet Take 1,000 Units by mouth daily.        Marland Kitchen LORazepam (ATIVAN) 1 MG tablet Take 1 mg by mouth every 8 (eight) hours as needed.       Marland Kitchen losartan-hydrochlorothiazide (HYZAAR) 50-12.5 MG per tablet Take 1 tablet by mouth daily.       Marland Kitchen senna (SENOKOT) 8.6 MG TABS Take 2 tablets by mouth at bedtime.       . sertraline (ZOLOFT) 100 MG tablet 50 mg 1 day or 1 dose.        No current facility-administered medications for this visit.    OBJECTIVE: Older white woman in no acute distress Filed Vitals:   12/07/13 1123  BP: 143/78  Pulse: 61  Temp: 98.3 F (36.8 C)  Resp: 18     Body mass index is 24.79 kg/(m^2).      ECOG FS: 0            Sclerae unicteric, EOMs intact Oropharynx clear and moist No cervical or supraclavicular adenopathy Lungs no rales or rhonchi Heart regular rate and rhythm Abd soft, nontender, positive bowel sounds MSK scoliosis but no focal spinal tenderness, no upper extremity lymphedema Neuro: nonfocal, well oriented, positive affect Breasts: The right breast is status post lumpectomy and radiation. There is no evidence of local recurrence. The right axilla is benign The left breast is unremarkable.    LAB RESULTS: Lab Results  Component Value Date   WBC 5.0 12/07/2013   NEUTROABS 3.3 12/07/2013   HGB 14.6 12/07/2013   HCT 44.8 12/07/2013   MCV 85.5 12/07/2013   PLT 168 12/07/2013      Chemistry      Component Value Date/Time   NA 143 12/07/2013 1044   NA 141 04/09/2011 1256   NA 139 11/07/2010 0825   K 3.2* 12/07/2013 1044   K 3.6 04/09/2011 1256   K 3.2* 11/07/2010 0825   CL 103 04/09/2011 1256   CL 99 11/07/2010 0825   CO2 28 12/07/2013 1044   CO2 26 04/09/2011 1256   CO2 28 11/07/2010 0825   BUN 14.9 12/07/2013 1044   BUN 15 04/09/2011 1256   BUN 13 11/07/2010 0825   CREATININE 0.9 12/07/2013 1044   CREATININE 0.85 04/09/2011 1256   CREATININE 0.9 11/07/2010 0825      Component Value  Date/Time   CALCIUM 10.0 12/07/2013 1044   CALCIUM 9.4 04/09/2011 1256   CALCIUM 9.1 11/07/2010 0825   ALKPHOS 76 12/07/2013 1044   ALKPHOS 74 04/09/2011 1256   ALKPHOS 65 11/07/2010 0825   AST 19 12/07/2013 1044   AST 22 04/09/2011 1256   AST 24 11/07/2010 0825   ALT 23 12/07/2013 1044   ALT 21 04/09/2011 1256  ALT 26 11/07/2010 0825   BILITOT 0.58 12/07/2013 1044   BILITOT 0.6 04/09/2011 1256   BILITOT 0.70 11/07/2010 0825       Lab Results  Component Value Date   LABCA2 25 11/07/2010    Urinalysis No results found for this basename: colorurine,  appearanceur,  labspec,  phurine,  glucoseu,  hgbur,  bilirubinur,  ketonesur,  proteinur,  urobilinogen,  nitrite,  leukocytesur    STUDIES: Mammography at [facility] 02/12/2013 was unremarkable; repeat August of 2015 is also reportedly negative, but I do not have that report available today  ASSESSMENT: 80 y.o. [place] woman:  1.  Status post right breast upper outer quadrant lumpectomy with sentinel node biopsy 11/26/2010 for a pT1c pN0, stage IA invasive ductal carcinoma, grade 1, triple-negative, Ki-67 35%.  2.  Status post radiation therapy from 01/15/2011 through 02/12/2011.  3.  Status post genetic testing with [facility] Personalized Breast Cancer Genomic Profile - breast cancer recurrence assay 70 gene signature prognostic and predictive tumor analysis shows high risk with a 10 year distant metastasis-free survival prior to treatment 71% (baseline risk or distant metastases 29%): These patients can expect their risk to be reduced with adjuvant chemotherapy.  However, the patient declined adjuvant chemotherapy.   PLAN: Dafina is doing very well from a breast cancer point of view. She understands that triple negative breast cancers, if they are going to recur, then to do so early. She has a ready past the most dangerous point, which is the first 2 years. She does need to complete 5 years of followup though before she can "graduate".  Her potassium is a  little bit low, and doubtless do to the Hyzaar. I have started her on a supplementation, 20 mEq daily.  Otherwise she will see [place] again in April of next year and then again next October. We will do lab work does same days. She knows to call for any problems that may develop before that.  Chauncey Cruel, MD   12/07/2013, 11:49 AM

## 2013-12-07 NOTE — Telephone Encounter (Signed)
per pof to sch pt appt-gave pt copy of sch °

## 2013-12-08 NOTE — Addendum Note (Signed)
Addended by: Laureen Abrahams on: 12/08/2013 05:59 PM   Modules accepted: Orders

## 2013-12-16 ENCOUNTER — Encounter: Payer: Self-pay | Admitting: *Deleted

## 2014-03-04 DIAGNOSIS — J189 Pneumonia, unspecified organism: Secondary | ICD-10-CM

## 2014-03-04 HISTORY — DX: Pneumonia, unspecified organism: J18.9

## 2014-05-10 ENCOUNTER — Other Ambulatory Visit: Payer: Self-pay | Admitting: Family Medicine

## 2014-05-10 ENCOUNTER — Ambulatory Visit
Admission: RE | Admit: 2014-05-10 | Discharge: 2014-05-10 | Disposition: A | Discharge status: Home / Self Care | Payer: Self-pay | Source: Ambulatory Visit | Attending: Family Medicine | Admitting: Family Medicine

## 2014-05-10 DIAGNOSIS — M79672 Pain in left foot: Secondary | ICD-10-CM

## 2014-06-20 ENCOUNTER — Encounter: Payer: Self-pay | Admitting: Nurse Practitioner

## 2014-06-20 ENCOUNTER — Other Ambulatory Visit (HOSPITAL_BASED_OUTPATIENT_CLINIC_OR_DEPARTMENT_OTHER): Payer: PPO

## 2014-06-20 ENCOUNTER — Ambulatory Visit (HOSPITAL_BASED_OUTPATIENT_CLINIC_OR_DEPARTMENT_OTHER): Payer: PPO | Admitting: Nurse Practitioner

## 2014-06-20 VITALS — BP 146/71 | HR 68 | Temp 97.5°F | Resp 18 | Wt 123.2 lb

## 2014-06-20 DIAGNOSIS — Z171 Estrogen receptor negative status [ER-]: Secondary | ICD-10-CM | POA: Diagnosis not present

## 2014-06-20 DIAGNOSIS — C50411 Malignant neoplasm of upper-outer quadrant of right female breast: Secondary | ICD-10-CM

## 2014-06-20 LAB — CBC WITH DIFFERENTIAL/PLATELET
BASO%: 1.4 % (ref 0.0–2.0)
BASOS ABS: 0.1 10*3/uL (ref 0.0–0.1)
EOS ABS: 0.1 10*3/uL (ref 0.0–0.5)
EOS%: 2.7 % (ref 0.0–7.0)
HCT: 42.5 % (ref 34.8–46.6)
HEMOGLOBIN: 13.9 g/dL (ref 11.6–15.9)
LYMPH%: 21.2 % (ref 14.0–49.7)
MCH: 27.4 pg (ref 25.1–34.0)
MCHC: 32.7 g/dL (ref 31.5–36.0)
MCV: 83.8 fL (ref 79.5–101.0)
MONO#: 0.3 10*3/uL (ref 0.1–0.9)
MONO%: 7.4 % (ref 0.0–14.0)
NEUT%: 67.3 % (ref 38.4–76.8)
NEUTROS ABS: 3.1 10*3/uL (ref 1.5–6.5)
PLATELETS: 152 10*3/uL (ref 145–400)
RBC: 5.07 10*6/uL (ref 3.70–5.45)
RDW: 15 % — ABNORMAL HIGH (ref 11.2–14.5)
WBC: 4.6 10*3/uL (ref 3.9–10.3)
lymph#: 1 10*3/uL (ref 0.9–3.3)

## 2014-06-20 LAB — COMPREHENSIVE METABOLIC PANEL (CC13)
ALT: 24 U/L (ref 0–55)
AST: 22 U/L (ref 5–34)
Albumin: 3.7 g/dL (ref 3.5–5.0)
Alkaline Phosphatase: 72 U/L (ref 40–150)
Anion Gap: 9 mEq/L (ref 3–11)
BILIRUBIN TOTAL: 0.45 mg/dL (ref 0.20–1.20)
BUN: 14.1 mg/dL (ref 7.0–26.0)
CO2: 25 mEq/L (ref 22–29)
Calcium: 9.5 mg/dL (ref 8.4–10.4)
Chloride: 106 mEq/L (ref 98–109)
Creatinine: 0.9 mg/dL (ref 0.6–1.1)
EGFR: 58 mL/min/{1.73_m2} — ABNORMAL LOW (ref >90)
Glucose: 121 mg/dl (ref 70–140)
Potassium: 3.6 mEq/L (ref 3.5–5.1)
SODIUM: 140 meq/L (ref 136–145)
TOTAL PROTEIN: 6.8 g/dL (ref 6.4–8.3)

## 2014-06-20 NOTE — Progress Notes (Signed)
[place]  [phone] Fax:(336) [phone]  OFFICE PROGRESS NOTE   ID: TREY BEBEE   DOB: Aug 17, 1931  MR#: [id number]  KZS#:[id number]   PCP: Vena Austria, MD SU: Haywood Lasso, MD RAD ONC: Arloa Koh, MD   HISTORY OF PRESENT ILLNESS: From Dr. Dewaine Conger New Patient Evaluation Note dated 11/07/2010:  "This is a delightful 81 year old woman from [facility] referred by Dr. Herbie Baltimore Read for evaluation of her recently diagnosed breast cancer.   Ms. Schwall is here today in the multidisciplinary clinic.  She is accompanied by her daughter, Dorian Pod, and son, Sonia Side.  Ms. Geidel has been in good health.  She undergoes annual screening mammography.  She had a screening mammogram on 10/19/2009 and this showed no abnormalities.  A follow up mammogram on 10/25/2010 showed an asymmetric density developing in the upper outer quadrant of the right breast.  No new abnormalities were seen.  Follow up views with ultrasound of the right breast was performed on 11/01/2010 and this confirmed the presence of a 1- x 1.3- x 1.5-cm irregular marginated nodular density with posterior acoustic shadowing.  The patient had a biopsy performed the same day.  This showed a moderately well-differentiated invasive ductal cancer.  This appeared to be ER and PR negative.  HER-2 at this point is still pending.  An MRI scan has been scheduled."    Her subsequent history is as detailed below.   INTERVAL HISTORY: [name] returns today for follow up of her breast cancer. The interval history is significant for breaking a bone in her left foot. She is wearing a small boot to this foot, but it does not cause her any pain.   REVIEW OF SYSTEMS: Derika denies fevers, chills, nausea, or vomiting. She takes colace to keep from being constipated. She is eating and drinking well. She denies shortness of breath, chest pain, cough, or palpitation. She has no headaches, dizziness, weakness, or  fatigue. A detailed review of systems is otherwise stable.  PAST MEDICAL HISTORY: Past Medical History  Diagnosis Date  . Hypercholesteremia   . Anxiety   . Osteoporosis   . Breast cancer, IDC, Right, Stage I 11/07/2010  . Constipation     PAST SURGICAL HISTORY: Past Surgical History  Procedure Laterality Date  . Breast lumpectomy w/ needle localization  11/26/2010    Right - Dr Margot Chimes  . Tumor removal  1960, 1970    benign tumor from bilateral breast  . Hemorrhoid surgery    . Tonsillectomy      FAMILY HISTORY Family History  Problem Relation Age of Onset  . Cancer Mother     lung  . Heart disease Mother   . Heart disease Father   . Heart disease Brother   . Heart disease Paternal Uncle   . Heart disease Maternal Grandmother   . Stroke Maternal Grandfather   . Heart disease Paternal Grandfather     GYNECOLOGIC HISTORY: She is G2, P2, menarche at age 45, menopause in 63.  Hormone replacement therapy taken for about 4 years.  SOCIAL HISTORY: Ms. Sestak is originally from [place], [place] and moved here when her husband (who was from [place], [place]) married her.  He died when he was 61.  She now lives by herself.  Son, Sonia Side, lives in [place].  Daughter, Dorian Pod, has 4 children and lives in [place].  The patient is very active socially in the [place]. She patient  retired from [employer] as a Solicitor  years ago.    ADVANCED DIRECTIVES:  in place  Health maintenance:  History  Substance Use Topics  . Smoking status: Never Smoker   . Smokeless tobacco: Never Used  . Alcohol Use: No    Colonoscopy: April 2014 PAP: 09/20/2010 Bone density:  Lipid panel: Not on file  Allergies  Allergen Reactions  . Iodine     Iodine IV  . Sulfa Antibiotics Rash    Current Outpatient Prescriptions  Medication Sig Dispense Refill  . atenolol (TENORMIN) 100 MG tablet Take 50 mg by mouth daily.     Marland Kitchen atorvastatin (LIPITOR) 40 MG tablet Take  40 mg by mouth daily.     . cholecalciferol (VITAMIN D) 1000 UNITS tablet Take 1,000 Units by mouth daily.      Marland Kitchen LORazepam (ATIVAN) 1 MG tablet Take 1 mg by mouth every 8 (eight) hours as needed.     Marland Kitchen losartan-hydrochlorothiazide (HYZAAR) 50-12.5 MG per tablet Take 1 tablet by mouth daily.     . Misc Natural Products (LUTEIN 20 PO) Take 1 tablet by mouth daily.    . potassium chloride SA (K-DUR,KLOR-CON) 20 MEQ tablet Take 1 tablet (20 mEq total) by mouth once. 90 tablet 4  . senna (SENOKOT) 8.6 MG TABS Take 2 tablets by mouth at bedtime.     . sertraline (ZOLOFT) 100 MG tablet 50 mg 1 day or 1 dose.      No current facility-administered medications for this visit.    OBJECTIVE: Older white woman in no acute distress Filed Vitals:   06/20/14 0930  BP: 146/71  Pulse: 68  Temp: 97.5 F (36.4 C)  Resp: 18     Body mass index is 24.87 kg/(m^2).      ECOG FS: 0 Skin: warm, dry  HEENT: sclerae anicteric, conjunctivae pink, oropharynx clear. No thrush or mucositis.  Lymph Nodes: No cervical or supraclavicular lymphadenopathy  Lungs: clear to auscultation bilaterally, no rales, wheezes, or rhonci  Heart: regular rate and rhythm  Abdomen: round, soft, non tender, positive bowel sounds  Musculoskeletal: No focal spinal tenderness, no peripheral edema  Neuro: non focal, well oriented, positive affect  Breasts: right breast status post lumpectomy and radiation. No evidence of recurrent disease. Right axilla benign. Left breast unremarkable.   LAB RESULTS: Lab Results  Component Value Date   WBC 4.6 06/20/2014   NEUTROABS 3.1 06/20/2014   HGB 13.9 06/20/2014   HCT 42.5 06/20/2014   MCV 83.8 06/20/2014   PLT 152 06/20/2014      Chemistry      Component Value Date/Time   NA 143 12/07/2013 1044   NA 141 04/09/2011 1256   NA 139 11/07/2010 0825   K 3.2* 12/07/2013 1044   K 3.6 04/09/2011 1256   K 3.2* 11/07/2010 0825   CL 103 04/09/2011 1256   CL 99 11/07/2010 0825   CO2 28  12/07/2013 1044   CO2 26 04/09/2011 1256   CO2 28 11/07/2010 0825   BUN 14.9 12/07/2013 1044   BUN 15 04/09/2011 1256   BUN 13 11/07/2010 0825   CREATININE 0.9 12/07/2013 1044   CREATININE 0.85 04/09/2011 1256   CREATININE 0.9 11/07/2010 0825      Component Value Date/Time   CALCIUM 10.0 12/07/2013 1044   CALCIUM 9.4 04/09/2011 1256   CALCIUM 9.1 11/07/2010 0825   ALKPHOS 76 12/07/2013 1044   ALKPHOS 74 04/09/2011 1256   ALKPHOS 65 11/07/2010 0825   AST 19 12/07/2013 1044   AST 22 04/09/2011  1256   AST 24 11/07/2010 0825   ALT 23 12/07/2013 1044   ALT 21 04/09/2011 1256   ALT 26 11/07/2010 0825   BILITOT 0.58 12/07/2013 1044   BILITOT 0.6 04/09/2011 1256   BILITOT 0.70 11/07/2010 0825       Lab Results  Component Value Date   LABCA2 25 11/07/2010    Urinalysis No results found for: COLORURINE  STUDIES: [facility]ammography at [facility] 02/12/2013 was unremarkable; repeat August of 2015 is also reportedly negative, but I do not have that report available today  ASSESSMENT: 81 y.o. [place] woman:  1.  Status post right breast upper outer quadrant lumpectomy with sentinel node biopsy 11/26/2010 for a pT1c pN0, stage IA invasive ductal carcinoma, grade 1, triple-negative, Ki-67 35%.  2.  Status post radiation therapy from 01/15/2011 through 02/12/2011.  3.  Status post genetic testing with [facility] Personalized Breast Cancer Genomic Profile - breast cancer recurrence assay 70 gene signature prognostic and predictive tumor analysis shows high risk with a 10 year distant metastasis-free survival prior to treatment 71% (baseline risk or distant metastases 29%): These patients can expect their risk to be reduced with adjuvant chemotherapy.  However, the patient declined adjuvant chemotherapy.   PLAN: Jesscia continues to do well as far as her breast cancer is concerned. She is now 3.5 years out from her definitive surgery with no evidence of recurrent disease. I am having the nurse  obtain her records from [place] from last year.   The CBC was reviewed in detail and was entirely stable. The CMET was not yet available to review. She continues on potassium supplements daily to counteract the side effects of the hyzaar. I will call her should her potassium still be abnormal.   Ethel will return in 6 months for labs and a follow up visit. She understands and agrees with this plan. She has been encouraged to call with any issues that might arise before her next visit here.   Laurie Panda, NP   06/20/2014, 10:00 AM

## 2014-10-06 ENCOUNTER — Other Ambulatory Visit: Payer: Self-pay | Admitting: Family Medicine

## 2014-10-06 ENCOUNTER — Ambulatory Visit
Admission: RE | Admit: 2014-10-06 | Discharge: 2014-10-06 | Disposition: A | Discharge status: Home / Self Care | Payer: PPO | Source: Ambulatory Visit | Attending: Family Medicine | Admitting: Family Medicine

## 2014-10-06 DIAGNOSIS — J209 Acute bronchitis, unspecified: Secondary | ICD-10-CM

## 2014-11-09 ENCOUNTER — Ambulatory Visit
Admission: RE | Admit: 2014-11-09 | Discharge: 2014-11-09 | Disposition: A | Discharge status: Home / Self Care | Payer: PPO | Source: Ambulatory Visit | Attending: Family Medicine | Admitting: Family Medicine

## 2014-11-09 ENCOUNTER — Other Ambulatory Visit: Payer: Self-pay | Admitting: Family Medicine

## 2014-11-09 DIAGNOSIS — Z09 Encounter for follow-up examination after completed treatment for conditions other than malignant neoplasm: Secondary | ICD-10-CM

## 2014-12-02 ENCOUNTER — Other Ambulatory Visit: Payer: Self-pay | Admitting: Family Medicine

## 2014-12-02 ENCOUNTER — Ambulatory Visit
Admission: RE | Admit: 2014-12-02 | Discharge: 2014-12-02 | Disposition: A | Discharge status: Home / Self Care | Payer: PPO | Source: Ambulatory Visit | Attending: Family Medicine | Admitting: Family Medicine

## 2014-12-02 DIAGNOSIS — J189 Pneumonia, unspecified organism: Secondary | ICD-10-CM

## 2014-12-19 ENCOUNTER — Other Ambulatory Visit: Payer: Self-pay | Admitting: *Deleted

## 2014-12-19 DIAGNOSIS — C50411 Malignant neoplasm of upper-outer quadrant of right female breast: Secondary | ICD-10-CM

## 2014-12-20 ENCOUNTER — Other Ambulatory Visit (HOSPITAL_BASED_OUTPATIENT_CLINIC_OR_DEPARTMENT_OTHER): Payer: PPO

## 2014-12-20 ENCOUNTER — Ambulatory Visit (HOSPITAL_BASED_OUTPATIENT_CLINIC_OR_DEPARTMENT_OTHER): Payer: PPO | Admitting: Oncology

## 2014-12-20 ENCOUNTER — Telehealth: Payer: Self-pay | Admitting: Oncology

## 2014-12-20 VITALS — BP 132/57 | HR 64 | Temp 97.6°F | Resp 18 | Ht 59.0 in | Wt 123.0 lb

## 2014-12-20 DIAGNOSIS — Z853 Personal history of malignant neoplasm of breast: Secondary | ICD-10-CM

## 2014-12-20 DIAGNOSIS — C50411 Malignant neoplasm of upper-outer quadrant of right female breast: Secondary | ICD-10-CM

## 2014-12-20 LAB — CBC WITH DIFFERENTIAL/PLATELET
BASO%: 0.9 % (ref 0.0–2.0)
Basophils Absolute: 0.1 10*3/uL (ref 0.0–0.1)
EOS%: 2 % (ref 0.0–7.0)
Eosinophils Absolute: 0.1 10*3/uL (ref 0.0–0.5)
HCT: 40.6 % (ref 34.8–46.6)
HGB: 13.5 g/dL (ref 11.6–15.9)
LYMPH%: 20.8 % (ref 14.0–49.7)
MCH: 27.8 pg (ref 25.1–34.0)
MCHC: 33.3 g/dL (ref 31.5–36.0)
MCV: 83.7 fL (ref 79.5–101.0)
MONO#: 0.5 10*3/uL (ref 0.1–0.9)
MONO%: 9.2 % (ref 0.0–14.0)
NEUT#: 3.6 10*3/uL (ref 1.5–6.5)
NEUT%: 67.1 % (ref 38.4–76.8)
Platelets: 167 10*3/uL (ref 145–400)
RBC: 4.85 10*6/uL (ref 3.70–5.45)
RDW: 15.2 % — ABNORMAL HIGH (ref 11.2–14.5)
WBC: 5.4 10*3/uL (ref 3.9–10.3)
lymph#: 1.1 10*3/uL (ref 0.9–3.3)

## 2014-12-20 LAB — COMPREHENSIVE METABOLIC PANEL (CC13)
ALT: 24 U/L (ref 0–55)
AST: 18 U/L (ref 5–34)
Albumin: 3.7 g/dL (ref 3.5–5.0)
Alkaline Phosphatase: 81 U/L (ref 40–150)
Anion Gap: 8 mEq/L (ref 3–11)
BUN: 13.9 mg/dL (ref 7.0–26.0)
CO2: 28 mEq/L (ref 22–29)
Calcium: 9.6 mg/dL (ref 8.4–10.4)
Chloride: 104 mEq/L (ref 98–109)
Creatinine: 0.9 mg/dL (ref 0.6–1.1)
EGFR: 61 mL/min/{1.73_m2} — ABNORMAL LOW (ref >90)
Glucose: 108 mg/dl (ref 70–140)
Potassium: 3.6 mEq/L (ref 3.5–5.1)
Sodium: 140 mEq/L (ref 136–145)
Total Bilirubin: 0.76 mg/dL (ref 0.20–1.20)
Total Protein: 6.9 g/dL (ref 6.4–8.3)

## 2014-12-20 NOTE — Progress Notes (Signed)
[name]  Telephone:(336) [phone] Fax:(336) [phone]  OFFICE PROGRESS NOTE   ID: Jenna [name]   DOB: 21-Jul-1931  MR#: [id number]  ZRA#:[id number]   PCP: Jenna Austria, MD SU: Jenna Lasso, MD RAD ONC: Jenna Koh, MD  CHIEF COMPLAINT: Triple negative breast cancer  CURRENT TREATMENT: Observation   HISTORY OF PRESENT ILLNESS: From Dr. Collier Salina [name]'s New Patient Evaluation Note dated 11/07/2010:  "This is a delightful 81 year old woman from [facility] referred by Dr. Herbie Baltimore [name] for evaluation of her recently diagnosed breast cancer.   Jenna [name] is here today in the multidisciplinary clinic.  She is accompanied by her daughter, Jenna [name], and son, Jenna [name].  Jenna [name] has been in good health.  She undergoes annual screening mammography.  She had a screening mammogram on 10/19/2009 and this showed no abnormalities.  A follow up mammogram on 10/25/2010 showed an asymmetric density developing in the upper outer quadrant of the right breast.  No new abnormalities were seen.  Follow up views with ultrasound of the right breast was performed on 11/01/2010 and this confirmed the presence of a 1- x 1.3- x 1.5-cm irregular marginated nodular density with posterior acoustic shadowing.  The patient had a biopsy performed the same day.  This showed a moderately well-differentiated invasive ductal cancer.  This appeared to be ER and PR negative.  HER-2 at this point is still pending.  An MRI scan has been scheduled."    Her subsequent history is as detailed below.   INTERVAL HISTORY: Jenna [name] returns today for follow-up of her breast cancer. From that point of view the interval history is unremarkable. She gives me no symptoms suggestive of disease activity or recurrence. She just had her mammography in September and it was unremarkable   REVIEW OF SYSTEMS: Jenna [name] tripped in March 2016 and ended up with a fracture in her right foot. She had to wear a boot for 6 weeks.  She then got back into her usual exercise routine but in August she developed a sore throat and laryngitis and eventually pretty bad cough which took her to Dr. Joneen [name] did she was found to have a right lower lobe pneumonia. She was treated with antibiotics and slowly recovered. The most recent chest x-ray on 12/02/2014 shows some minimal residual infiltrate. She has not yet resumed her usual exercise routine but she is planning to do it. Aside from these issues she has chronic low back pain which is not more intense or persistent than before, and some ringing in her years which she attributes to think correctly to seasonal allergies. She has a tooth in the right jaw that needs to be removed. Aside from these issues a detailed review of systems today was noncontributory  PAST MEDICAL HISTORY: Past Medical History  Diagnosis Date  . Hypercholesteremia   . Anxiety   . Osteoporosis   . Breast cancer, IDC, Right, Stage I 11/07/2010  . Constipation     PAST SURGICAL HISTORY: Past Surgical History  Procedure Laterality Date  . Breast lumpectomy w/ needle localization  11/26/2010    Right - Dr Jenna [name]  . Tumor removal  1960, 1970    benign tumor from bilateral breast  . Hemorrhoid surgery    . Tonsillectomy      FAMILY HISTORY Family History  Problem Relation Age of Onset  . Cancer Mother     lung  . Heart disease Mother   . Heart disease Father   . Heart disease Brother   . Heart  disease Paternal Uncle   . Heart disease Maternal Grandmother   . Stroke Maternal Grandfather   . Heart disease Paternal Grandfather     GYNECOLOGIC HISTORY: She is G2, P2, menarche at age 87, menopause in 63.  Hormone replacement therapy taken for about 4 years.  SOCIAL HISTORY: Ms. Jenna [name] is originally from [place], [place] and moved here when her husband (who was from [name], [place]) married her.  He died when he was 8.  She now lives by herself.  Son, Jenna [name], lives in [name].  Daughter, Jenna [name], has 4  children and lives in [name].  The patient is very active socially in the [name]. She patient  retired from [name] as a [name] 11 years ago.    ADVANCED DIRECTIVES:  in place  Health maintenance:  Social History  Substance Use Topics  . Smoking status: Never Smoker   . Smokeless tobacco: Never Used  . Alcohol Use: No    Colonoscopy: April 2014 PAP: 09/20/2010 Bone density:  Lipid panel: Not on file  Allergies  Allergen Reactions  . Iodine     Iodine IV  . Sulfa Antibiotics Rash    Current Outpatient Prescriptions  Medication Sig Dispense Refill  . atenolol (TENORMIN) 100 MG tablet Take 50 mg by mouth daily.     Marland Kitchen atorvastatin (LIPITOR) 40 MG tablet Take 40 mg by mouth daily.     . cholecalciferol (VITAMIN D) 1000 UNITS tablet Take 1,000 Units by mouth daily.      Marland Kitchen LORazepam (ATIVAN) 1 MG tablet Take 1 mg by mouth every 8 (eight) hours as needed.     Marland Kitchen losartan-hydrochlorothiazide (HYZAAR) 50-12.5 MG per tablet Take 1 tablet by mouth daily.     . Misc Natural Products (LUTEIN 20 PO) Take 1 tablet by mouth daily.    . potassium chloride SA (K-DUR,KLOR-CON) 20 MEQ tablet Take 1 tablet (20 mEq total) by mouth once. 90 tablet 4  . senna (SENOKOT) 8.6 MG TABS Take 2 tablets by mouth at bedtime.     . sertraline (ZOLOFT) 100 MG tablet 50 mg 1 day or 1 dose.      No current facility-administered medications for this visit.    OBJECTIVE: Older white woman who appears younger than stated age 85 Vitals:   12/20/14 1339  BP: 132/57  Pulse: 64  Temp: 97.6 F (36.4 C)  Resp: 18     Body mass index is 24.83 kg/(m^2).      ECOG FS: 0            Sclerae unicteric, pupils round and equal Oropharynx clear and moist-- no thrush or other lesions No cervical or supraclavicular adenopathy Lungs no rales or rhonchi Heart regular rate and rhythm Abd soft, nontender, positive bowel sounds MSK no focal spinal tenderness, no upper extremity  lymphedema Neuro: nonfocal, well oriented, appropriate affect Breasts: The right breast is status post lumpectomy and radiation. There is minimal change of the breast contour, but no evidence of disease recurrence. The right axilla is benign. The left breast is unremarkable.    LAB RESULTS: Lab Results  Component Value Date   WBC 5.4 12/20/2014   NEUTROABS 3.6 12/20/2014   HGB 13.5 12/20/2014   HCT 40.6 12/20/2014   MCV 83.7 12/20/2014   PLT 167 12/20/2014      Chemistry      Component Value Date/Time   NA 140 06/20/2014 0916   NA 141 04/09/2011 1256   NA 139 11/07/2010 0825  K 3.6 06/20/2014 0916   K 3.6 04/09/2011 1256   K 3.2* 11/07/2010 0825   CL 103 04/09/2011 1256   CL 99 11/07/2010 0825   CO2 25 06/20/2014 0916   CO2 26 04/09/2011 1256   CO2 28 11/07/2010 0825   BUN 14.1 06/20/2014 0916   BUN 15 04/09/2011 1256   BUN 13 11/07/2010 0825   CREATININE 0.9 06/20/2014 0916   CREATININE 0.85 04/09/2011 1256   CREATININE 0.9 11/07/2010 0825      Component Value Date/Time   CALCIUM 9.5 06/20/2014 0916   CALCIUM 9.4 04/09/2011 1256   CALCIUM 9.1 11/07/2010 0825   ALKPHOS 72 06/20/2014 0916   ALKPHOS 74 04/09/2011 1256   ALKPHOS 65 11/07/2010 0825   AST 22 06/20/2014 0916   AST 22 04/09/2011 1256   AST 24 11/07/2010 0825   ALT 24 06/20/2014 0916   ALT 21 04/09/2011 1256   ALT 26 11/07/2010 0825   BILITOT 0.45 06/20/2014 0916   BILITOT 0.6 04/09/2011 1256   BILITOT 0.70 11/07/2010 0825       Lab Results  Component Value Date   LABCA2 25 11/07/2010    Urinalysis No results found for: COLORURINE  STUDIES: Mammography at [facility] 11/16/2014 showed breast density category B, and no evidence of active disease.  ASSESSMENT: 81 y.o. [place] woman:  1.  Status post right breast upper outer quadrant lumpectomy with sentinel node biopsy 11/26/2010 for a pT1c pN0, stage IA invasive ductal carcinoma, grade 1, triple-negative, Ki-67 35%.  2.  Status post  radiation therapy from 01/15/2011 through 02/12/2011.  3.  Status post genetic testing with [facility] Personalized Breast Cancer Genomic Profile - breast cancer recurrence assay 70 gene signature prognostic and predictive tumor analysis shows high risk with a 10 year distant metastasis-free survival prior to treatment 71% (baseline risk or distant metastases 29%): These patients can expect their risk to be reduced with adjuvant chemotherapy.  However, the patient declined adjuvant chemotherapy.   PLAN: Sherrye got over her pneumonia and Jenna foot and is waiting for the "thproblemto happen, but hopefully nothing more will happen than that she will continue to improve and get back into her exercise routine.  From a breast cancer point of view she is now 4 years out from her surgery, with no evidence of disease recurrence. This is particularly favorable in estrogen receptor negative cases, which if we are going to recur tend to recur early.  She is going to see me one more time year from now in assuming all is well at that time she can "graduate" from follow-up.  She knows to call for any problems that may develop before that visit.  Chauncey Cruel, MD   12/20/2014, 1:44 PM

## 2014-12-20 NOTE — Telephone Encounter (Signed)
appoitments made and avs printed for pateint

## 2015-01-29 ENCOUNTER — Emergency Department (HOSPITAL_BASED_OUTPATIENT_CLINIC_OR_DEPARTMENT_OTHER): Payer: PPO

## 2015-01-29 ENCOUNTER — Inpatient Hospital Stay (HOSPITAL_BASED_OUTPATIENT_CLINIC_OR_DEPARTMENT_OTHER)
Admission: EM | Admit: 2015-01-29 | Discharge: 2015-02-09 | DRG: 871 | Disposition: A | Discharge status: Skilled Nursing Facility | Payer: PPO | Attending: Internal Medicine | Admitting: Internal Medicine

## 2015-01-29 ENCOUNTER — Encounter (HOSPITAL_COMMUNITY): Payer: Self-pay | Admitting: Internal Medicine

## 2015-01-29 DIAGNOSIS — D696 Thrombocytopenia, unspecified: Secondary | ICD-10-CM | POA: Diagnosis present

## 2015-01-29 DIAGNOSIS — R6521 Severe sepsis with septic shock: Secondary | ICD-10-CM | POA: Diagnosis present

## 2015-01-29 DIAGNOSIS — J810 Acute pulmonary edema: Secondary | ICD-10-CM | POA: Diagnosis present

## 2015-01-29 DIAGNOSIS — R739 Hyperglycemia, unspecified: Secondary | ICD-10-CM | POA: Diagnosis not present

## 2015-01-29 DIAGNOSIS — A414 Sepsis due to anaerobes: Secondary | ICD-10-CM | POA: Diagnosis present

## 2015-01-29 DIAGNOSIS — R7989 Other specified abnormal findings of blood chemistry: Secondary | ICD-10-CM

## 2015-01-29 DIAGNOSIS — R7881 Bacteremia: Secondary | ICD-10-CM | POA: Diagnosis not present

## 2015-01-29 DIAGNOSIS — J9801 Acute bronchospasm: Secondary | ICD-10-CM | POA: Diagnosis not present

## 2015-01-29 DIAGNOSIS — E861 Hypovolemia: Secondary | ICD-10-CM | POA: Diagnosis present

## 2015-01-29 DIAGNOSIS — M810 Age-related osteoporosis without current pathological fracture: Secondary | ICD-10-CM | POA: Diagnosis present

## 2015-01-29 DIAGNOSIS — F411 Generalized anxiety disorder: Secondary | ICD-10-CM | POA: Diagnosis present

## 2015-01-29 DIAGNOSIS — Z91048 Other nonmedicinal substance allergy status: Secondary | ICD-10-CM | POA: Diagnosis not present

## 2015-01-29 DIAGNOSIS — E860 Dehydration: Secondary | ICD-10-CM | POA: Diagnosis present

## 2015-01-29 DIAGNOSIS — R778 Other specified abnormalities of plasma proteins: Secondary | ICD-10-CM | POA: Diagnosis present

## 2015-01-29 DIAGNOSIS — A09 Infectious gastroenteritis and colitis, unspecified: Secondary | ICD-10-CM | POA: Diagnosis present

## 2015-01-29 DIAGNOSIS — E785 Hyperlipidemia, unspecified: Secondary | ICD-10-CM | POA: Diagnosis present

## 2015-01-29 DIAGNOSIS — Z8249 Family history of ischemic heart disease and other diseases of the circulatory system: Secondary | ICD-10-CM | POA: Diagnosis not present

## 2015-01-29 DIAGNOSIS — B961 Klebsiella pneumoniae [K. pneumoniae] as the cause of diseases classified elsewhere: Secondary | ICD-10-CM | POA: Diagnosis not present

## 2015-01-29 DIAGNOSIS — K56609 Unspecified intestinal obstruction, unspecified as to partial versus complete obstruction: Secondary | ICD-10-CM

## 2015-01-29 DIAGNOSIS — Z79899 Other long term (current) drug therapy: Secondary | ICD-10-CM

## 2015-01-29 DIAGNOSIS — G9341 Metabolic encephalopathy: Secondary | ICD-10-CM | POA: Diagnosis present

## 2015-01-29 DIAGNOSIS — A419 Sepsis, unspecified organism: Secondary | ICD-10-CM | POA: Diagnosis present

## 2015-01-29 DIAGNOSIS — E871 Hypo-osmolality and hyponatremia: Secondary | ICD-10-CM | POA: Diagnosis present

## 2015-01-29 DIAGNOSIS — E7800 Pure hypercholesterolemia, unspecified: Secondary | ICD-10-CM | POA: Diagnosis present

## 2015-01-29 DIAGNOSIS — E876 Hypokalemia: Secondary | ICD-10-CM | POA: Diagnosis present

## 2015-01-29 DIAGNOSIS — N179 Acute kidney failure, unspecified: Secondary | ICD-10-CM | POA: Diagnosis present

## 2015-01-29 DIAGNOSIS — B9689 Other specified bacterial agents as the cause of diseases classified elsewhere: Secondary | ICD-10-CM | POA: Diagnosis not present

## 2015-01-29 DIAGNOSIS — Z809 Family history of malignant neoplasm, unspecified: Secondary | ICD-10-CM | POA: Diagnosis not present

## 2015-01-29 DIAGNOSIS — I248 Other forms of acute ischemic heart disease: Secondary | ICD-10-CM | POA: Diagnosis present

## 2015-01-29 DIAGNOSIS — R061 Stridor: Secondary | ICD-10-CM

## 2015-01-29 DIAGNOSIS — J9601 Acute respiratory failure with hypoxia: Secondary | ICD-10-CM | POA: Diagnosis not present

## 2015-01-29 DIAGNOSIS — R945 Abnormal results of liver function studies: Secondary | ICD-10-CM

## 2015-01-29 DIAGNOSIS — E872 Acidosis: Secondary | ICD-10-CM | POA: Diagnosis present

## 2015-01-29 DIAGNOSIS — R6520 Severe sepsis without septic shock: Secondary | ICD-10-CM

## 2015-01-29 DIAGNOSIS — A4159 Other Gram-negative sepsis: Secondary | ICD-10-CM | POA: Diagnosis present

## 2015-01-29 DIAGNOSIS — Z881 Allergy status to other antibiotic agents status: Secondary | ICD-10-CM

## 2015-01-29 DIAGNOSIS — A4189 Other specified sepsis: Secondary | ICD-10-CM | POA: Diagnosis not present

## 2015-01-29 DIAGNOSIS — J811 Chronic pulmonary edema: Secondary | ICD-10-CM

## 2015-01-29 DIAGNOSIS — I5189 Other ill-defined heart diseases: Secondary | ICD-10-CM | POA: Diagnosis present

## 2015-01-29 DIAGNOSIS — C50919 Malignant neoplasm of unspecified site of unspecified female breast: Secondary | ICD-10-CM | POA: Diagnosis present

## 2015-01-29 DIAGNOSIS — R079 Chest pain, unspecified: Secondary | ICD-10-CM | POA: Diagnosis present

## 2015-01-29 DIAGNOSIS — R34 Anuria and oliguria: Secondary | ICD-10-CM | POA: Diagnosis not present

## 2015-01-29 DIAGNOSIS — I10 Essential (primary) hypertension: Secondary | ICD-10-CM | POA: Diagnosis not present

## 2015-01-29 DIAGNOSIS — J90 Pleural effusion, not elsewhere classified: Secondary | ICD-10-CM | POA: Diagnosis present

## 2015-01-29 DIAGNOSIS — D72829 Elevated white blood cell count, unspecified: Secondary | ICD-10-CM | POA: Diagnosis not present

## 2015-01-29 LAB — URINE MICROSCOPIC-ADD ON: RBC / HPF: NONE SEEN RBC/hpf (ref 0–5)

## 2015-01-29 LAB — CBC
HCT: 37.6 % (ref 36.0–46.0)
Hemoglobin: 13.1 g/dL (ref 12.0–15.0)
MCH: 27.4 pg (ref 26.0–34.0)
MCHC: 34.8 g/dL (ref 30.0–36.0)
MCV: 78.7 fL (ref 78.0–100.0)
Platelets: 69 10*3/uL — ABNORMAL LOW (ref 150–400)
RBC: 4.78 MIL/uL (ref 3.87–5.11)
RDW: 15.2 % (ref 11.5–15.5)
WBC: 6.8 10*3/uL (ref 4.0–10.5)

## 2015-01-29 LAB — TROPONIN I
TROPONIN I: 0.13 ng/mL — AB (ref <0.031)
Troponin I: 0.15 ng/mL — ABNORMAL HIGH (ref <0.031)

## 2015-01-29 LAB — COMPREHENSIVE METABOLIC PANEL
ALT: 98 U/L — ABNORMAL HIGH (ref 14–54)
ANION GAP: 16 — AB (ref 5–15)
AST: 98 U/L — ABNORMAL HIGH (ref 15–41)
Albumin: 2.7 g/dL — ABNORMAL LOW (ref 3.5–5.0)
Alkaline Phosphatase: 170 U/L — ABNORMAL HIGH (ref 38–126)
BUN: 40 mg/dL — ABNORMAL HIGH (ref 6–20)
CHLORIDE: 92 mmol/L — AB (ref 101–111)
CO2: 21 mmol/L — ABNORMAL LOW (ref 22–32)
Calcium: 7 mg/dL — ABNORMAL LOW (ref 8.9–10.3)
Creatinine, Ser: 4.46 mg/dL — ABNORMAL HIGH (ref 0.44–1.00)
GFR calc Af Amer: 10 mL/min — ABNORMAL LOW (ref >60)
GFR, EST NON AFRICAN AMERICAN: 8 mL/min — AB (ref >60)
Glucose, Bld: 175 mg/dL — ABNORMAL HIGH (ref 65–99)
POTASSIUM: 1.6 mmol/L — AB (ref 3.5–5.1)
Sodium: 129 mmol/L — ABNORMAL LOW (ref 135–145)
TOTAL PROTEIN: 6.2 g/dL — AB (ref 6.5–8.1)
Total Bilirubin: 2.1 mg/dL — ABNORMAL HIGH (ref 0.3–1.2)

## 2015-01-29 LAB — CBC WITH DIFFERENTIAL/PLATELET
BLASTS: 0 %
Band Neutrophils: 60 %
Basophils Absolute: 0 10*3/uL (ref 0.0–0.1)
Basophils Relative: 0 %
EOS PCT: 0 %
Eosinophils Absolute: 0 10*3/uL (ref 0.0–0.7)
HEMATOCRIT: 37.2 % (ref 36.0–46.0)
HEMOGLOBIN: 13 g/dL (ref 12.0–15.0)
LYMPHS ABS: 0.3 10*3/uL — AB (ref 0.7–4.0)
LYMPHS PCT: 5 %
MCH: 27 pg (ref 26.0–34.0)
MCHC: 34.9 g/dL (ref 30.0–36.0)
MCV: 77.2 fL — AB (ref 78.0–100.0)
MONOS PCT: 6 %
Metamyelocytes Relative: 5 %
Monocytes Absolute: 0.4 10*3/uL (ref 0.1–1.0)
Myelocytes: 3 %
NEUTROS ABS: 5.4 10*3/uL (ref 1.7–7.7)
NEUTROS PCT: 21 %
NRBC: 0 /100{WBCs}
Platelets: 89 10*3/uL — ABNORMAL LOW (ref 150–400)
Promyelocytes Absolute: 0 %
RBC: 4.82 MIL/uL (ref 3.87–5.11)
RDW: 14.8 % (ref 11.5–15.5)
WBC: 6.1 10*3/uL (ref 4.0–10.5)

## 2015-01-29 LAB — BASIC METABOLIC PANEL
ANION GAP: 13 (ref 5–15)
Anion gap: 12 (ref 5–15)
BUN: 35 mg/dL — ABNORMAL HIGH (ref 6–20)
BUN: 34 mg/dL — ABNORMAL HIGH (ref 6–20)
CALCIUM: 6.8 mg/dL — AB (ref 8.9–10.3)
CO2: 18 mmol/L — AB (ref 22–32)
CO2: 14 mmol/L — ABNORMAL LOW (ref 22–32)
Calcium: 6.2 mg/dL — CL (ref 8.9–10.3)
Chloride: 100 mmol/L — ABNORMAL LOW (ref 101–111)
Chloride: 107 mmol/L (ref 101–111)
Creatinine, Ser: 4.17 mg/dL — ABNORMAL HIGH (ref 0.44–1.00)
Creatinine, Ser: 3.76 mg/dL — ABNORMAL HIGH (ref 0.44–1.00)
GFR calc Af Amer: 12 mL/min — ABNORMAL LOW (ref >60)
GFR calc non Af Amer: 10 mL/min — ABNORMAL LOW (ref >60)
GFR, EST AFRICAN AMERICAN: 11 mL/min — AB (ref >60)
GFR, EST NON AFRICAN AMERICAN: 9 mL/min — AB (ref >60)
Glucose, Bld: 124 mg/dL — ABNORMAL HIGH (ref 65–99)
Glucose, Bld: 114 mg/dL — ABNORMAL HIGH (ref 65–99)
Potassium: <2 mmol/L — CL (ref 3.5–5.1)
Potassium: 2 mmol/L — CL (ref 3.5–5.1)
SODIUM: 131 mmol/L — AB (ref 135–145)
Sodium: 133 mmol/L — ABNORMAL LOW (ref 135–145)

## 2015-01-29 LAB — URINALYSIS, ROUTINE W REFLEX MICROSCOPIC
GLUCOSE, UA: NEGATIVE mg/dL
Hgb urine dipstick: NEGATIVE
Ketones, ur: 15 mg/dL — AB
Leukocytes, UA: NEGATIVE
NITRITE: NEGATIVE
PH: 6 (ref 5.0–8.0)
Protein, ur: 100 mg/dL — AB
SPECIFIC GRAVITY, URINE: 1.012 (ref 1.005–1.030)

## 2015-01-29 LAB — MAGNESIUM
MAGNESIUM: 1.3 mg/dL — AB (ref 1.7–2.4)
MAGNESIUM: 1.6 mg/dL — AB (ref 1.7–2.4)

## 2015-01-29 LAB — LACTIC ACID, PLASMA
LACTIC ACID, VENOUS: 3.8 mmol/L — AB (ref 0.5–2.0)
Lactic Acid, Venous: 2.7 mmol/L (ref 0.5–2.0)

## 2015-01-29 LAB — I-STAT CG4 LACTIC ACID, ED: LACTIC ACID, VENOUS: 3.27 mmol/L — AB (ref 0.5–2.0)

## 2015-01-29 LAB — PROCALCITONIN: PROCALCITONIN: 36.76 ng/mL

## 2015-01-29 LAB — OCCULT BLOOD X 1 CARD TO LAB, STOOL: FECAL OCCULT BLD: NEGATIVE

## 2015-01-29 LAB — MRSA PCR SCREENING: MRSA by PCR: NEGATIVE

## 2015-01-29 MED ORDER — POTASSIUM CHLORIDE CRYS ER 20 MEQ PO TBCR
40.0000 meq | EXTENDED_RELEASE_TABLET | Freq: Two times a day (BID) | ORAL | Status: DC
  Administered 2015-01-29: 40 meq via ORAL
  Filled 2015-01-29 (×4): qty 2

## 2015-01-29 MED ORDER — CIPROFLOXACIN IN D5W 400 MG/200ML IV SOLN
400.0000 mg | Freq: Once | INTRAVENOUS | Status: AC
  Administered 2015-01-29: 400 mg via INTRAVENOUS
  Filled 2015-01-29: qty 200

## 2015-01-29 MED ORDER — ACETAMINOPHEN 325 MG PO TABS
650.0000 mg | ORAL_TABLET | Freq: Four times a day (QID) | ORAL | Status: DC | PRN
  Administered 2015-02-05: 650 mg via ORAL
  Filled 2015-01-29: qty 2

## 2015-01-29 MED ORDER — SODIUM CHLORIDE 0.9 % IV BOLUS (SEPSIS)
1000.0000 mL | INTRAVENOUS | Status: AC
  Administered 2015-01-29 (×2): 1000 mL via INTRAVENOUS

## 2015-01-29 MED ORDER — POTASSIUM CHLORIDE CRYS ER 20 MEQ PO TBCR
40.0000 meq | EXTENDED_RELEASE_TABLET | Freq: Two times a day (BID) | ORAL | Status: DC
  Administered 2015-01-30: 40 meq via ORAL
  Filled 2015-01-29 (×2): qty 2

## 2015-01-29 MED ORDER — METRONIDAZOLE IN NACL 5-0.79 MG/ML-% IV SOLN
500.0000 mg | Freq: Once | INTRAVENOUS | Status: AC
  Administered 2015-01-29: 500 mg via INTRAVENOUS
  Filled 2015-01-29: qty 100

## 2015-01-29 MED ORDER — SODIUM CHLORIDE 0.9 % IV BOLUS (SEPSIS)
1500.0000 mL | Freq: Once | INTRAVENOUS | Status: AC
  Administered 2015-01-29: 1500 mL via INTRAVENOUS

## 2015-01-29 MED ORDER — METRONIDAZOLE IN NACL 5-0.79 MG/ML-% IV SOLN
500.0000 mg | Freq: Three times a day (TID) | INTRAVENOUS | Status: DC
  Administered 2015-01-30: 500 mg via INTRAVENOUS
  Filled 2015-01-29 (×2): qty 100

## 2015-01-29 MED ORDER — POTASSIUM CHLORIDE CRYS ER 20 MEQ PO TBCR
60.0000 meq | EXTENDED_RELEASE_TABLET | Freq: Once | ORAL | Status: AC
  Administered 2015-01-29: 60 meq via ORAL
  Filled 2015-01-29: qty 3

## 2015-01-29 MED ORDER — MAGNESIUM SULFATE 2 GM/50ML IV SOLN
2.0000 g | Freq: Once | INTRAVENOUS | Status: AC
Start: 2015-01-29 — End: 2015-01-29
  Administered 2015-01-29: 2 g via INTRAVENOUS
  Filled 2015-01-29: qty 50

## 2015-01-29 MED ORDER — SODIUM CHLORIDE 0.9 % IV BOLUS (SEPSIS)
1000.0000 mL | Freq: Once | INTRAVENOUS | Status: AC
  Administered 2015-01-29: 1000 mL via INTRAVENOUS

## 2015-01-29 MED ORDER — POTASSIUM CHLORIDE 10 MEQ/100ML IV SOLN
10.0000 meq | INTRAVENOUS | Status: AC
  Administered 2015-01-29: 10 meq via INTRAVENOUS
  Filled 2015-01-29: qty 100

## 2015-01-29 MED ORDER — POTASSIUM CHLORIDE CRYS ER 20 MEQ PO TBCR
40.0000 meq | EXTENDED_RELEASE_TABLET | Freq: Once | ORAL | Status: AC
  Administered 2015-01-29: 40 meq via ORAL
  Filled 2015-01-29: qty 2

## 2015-01-29 MED ORDER — MORPHINE SULFATE (PF) 2 MG/ML IV SOLN
2.0000 mg | Freq: Once | INTRAVENOUS | Status: DC
Start: 2015-01-29 — End: 2015-01-29
  Filled 2015-01-29: qty 1

## 2015-01-29 MED ORDER — SODIUM CHLORIDE 0.9 % IV SOLN
INTRAVENOUS | Status: DC
  Administered 2015-01-30: 01:00:00 via INTRAVENOUS

## 2015-01-29 MED ORDER — ACETAMINOPHEN 650 MG RE SUPP: 650.0000 mg | Freq: Four times a day (QID) | RECTAL | Status: DC | PRN

## 2015-01-29 MED ORDER — POTASSIUM CHLORIDE 10 MEQ/100ML IV SOLN
10.0000 meq | Freq: Once | INTRAVENOUS | Status: AC
  Administered 2015-01-29: 10 meq via INTRAVENOUS
  Filled 2015-01-29: qty 100

## 2015-01-29 MED ORDER — POTASSIUM CHLORIDE 10 MEQ/100ML IV SOLN
10.0000 meq | INTRAVENOUS | Status: AC
  Administered 2015-01-30 (×2): 10 meq via INTRAVENOUS
  Filled 2015-01-29 (×3): qty 100

## 2015-01-29 MED ORDER — SODIUM CHLORIDE 0.9 % IV SOLN
1.0000 g | Freq: Once | INTRAVENOUS | Status: AC
  Administered 2015-01-29: 1 g via INTRAVENOUS
  Filled 2015-01-29: qty 10

## 2015-01-29 MED ORDER — ONDANSETRON HCL 4 MG/2ML IJ SOLN: 4.0000 mg | Freq: Four times a day (QID) | INTRAMUSCULAR | Status: DC | PRN

## 2015-01-29 MED ORDER — ONDANSETRON HCL 4 MG PO TABS: 4.0000 mg | ORAL_TABLET | Freq: Four times a day (QID) | ORAL | Status: DC | PRN

## 2015-01-29 MED ORDER — POTASSIUM CHLORIDE 10 MEQ/100ML IV SOLN
10.0000 meq | INTRAVENOUS | Status: AC
  Administered 2015-01-29 (×2): 10 meq via INTRAVENOUS
  Filled 2015-01-29 (×2): qty 100

## 2015-01-29 MED ORDER — CIPROFLOXACIN IN D5W 400 MG/200ML IV SOLN: 400.0000 mg | INTRAVENOUS | Status: DC

## 2015-01-29 NOTE — Progress Notes (Addendum)
CRITICAL VALUE ALERT  Critical value received:  Lactic Acid 3.8 and Potassium less than 2  Date of notification:  01/29/15  Time of notification:  2111  Critical value read back: Yes  Nurse who received alert:  Remo Lipps, RN  MD notified (1st page): Hal Hope   Time of first page:  2115  Time MD responded:  2120

## 2015-01-29 NOTE — Progress Notes (Signed)
ANTIBIOTIC CONSULT NOTE - INITIAL  Pharmacy Consult for cipro Indication: intra-abdominal infection  Allergies  Allergen Reactions  . Iodine     Iodine IV  . Sulfa Antibiotics Rash    Patient Measurements: Weight: 120 lb (54.432 kg) Adjusted Body Weight:   Vital Signs: Temp: 97.9 F (36.6 C) (11/27 1804) Temp Source: Rectal (11/27 1804) BP: 86/47 mmHg (11/27 1745) Pulse Rate: 92 (11/27 1745) Intake/Output from previous day:   Intake/Output from this shift:    Labs:  Recent Labs  01/29/15 1445  WBC 6.1  HGB 13.0  PLT 89*  CREATININE 4.46*   Estimated Creatinine Clearance: 7.4 mL/min (by C-G formula based on Cr of 4.46). No results for input(s): VANCOTROUGH, VANCOPEAK, VANCORANDOM, GENTTROUGH, GENTPEAK, GENTRANDOM, TOBRATROUGH, TOBRAPEAK, TOBRARND, AMIKACINPEAK, AMIKACINTROU, AMIKACIN in the last 72 hours.   Microbiology: No results found for this or any previous visit (from the past 720 hour(s)).  Medical History: Past Medical History  Diagnosis Date  . Hypercholesteremia   . Anxiety   . Osteoporosis   . Breast cancer, IDC, Right, Stage I 11/07/2010  . Constipation     Medications:  Scheduled:  . [START ON 01/30/2015] metronidazole  500 mg Intravenous Q8H  . sodium chloride  1,500 mL Intravenous Once  . sodium chloride  1,500 mL Intravenous Once   Infusions:  . sodium chloride     Assessment: 81yo female with intra-abdominal infection will be continued on cipro.  Patient got one dose of cipro 400 mg iv at 1619.  Plan:  - cipro 400 mg iv q24h, next dose at 1600 tom  Mc Hollen, Tsz-Yin 01/29/2015,8:18 PM

## 2015-01-29 NOTE — ED Notes (Addendum)
Pt brought to [facility] by EMS, pt lethargic, mottled skin to chest and fingers. Cool to touch. Weak pulses throughout. Lungs clear, hyperactive bowel sounds present. No stool at present time. New PIV started in RT PIV, EMS started jn the field the Lt PIV. Bair Hugger placed due to core temp low. Dr. Evlyn Kanner at bedside.

## 2015-01-29 NOTE — Progress Notes (Signed)
Patient received to room 2C09 from [facility] via stretcher / ambulance accompanied by three attendants.  Report received from [facility] indicated that patient had had a history of diarrhea x 5 days when family found her to with AMS and lethargic.  Patient on enteric precautions and sepsis protocol was initiated.  Dr. Hal Hope notified of patient's arrival, current vital signs, and lab reports.  Affect appears vague.  Responses to questions hesitant, though she is oriented to person, place, and situation.  Unable to tell me the year / month.  Patient oriented to room and to staff and showed how to use call light system.  Placed on monitor.

## 2015-01-29 NOTE — ED Notes (Signed)
Dr. Winfred Leeds at bedside - updated him on current treatment and spoke with family.

## 2015-01-29 NOTE — Progress Notes (Signed)
81 yr old presents with diarrhea and generalized weakness gradual onset 4 days ago. She reports that diarrhea has become "dark" she denies pain anywhere denies fever. Treated with Lomotil and Imodium without relief. Denies abdominal pain she does admit to diminished appetite. No other associated symptoms.Marland Kitchen Upon arrival of EMS patient had blood pressure of 70/50. She was treated with saline 500 mL bolus en route.k 1.6, sodium 129, mag 1.3, very dry  Started on cipro/flagyl for possible colitis, replete K,mag prior to transfer, repeat all labs on arrival

## 2015-01-29 NOTE — H&P (Addendum)
[facility] History and Physical  Jenna [name] [id number] DOB: [birth year] DOA: 01/29/2015  Referring physician: Patient was transferred from [facility]. PCP: Vena Austria, MD  Specialists: None.  Chief Complaint: Diarrhea and weakness.  HPI: Jenna [name] is a 81 y.o. female with history of hypertension, hyperlipidemia and breast cancer in remission was brought to the ER at [facility] because of patient having persistent diarrhea over the last 4 days. Patient states patient has been having multiple episodes of diarrhea to the point that patient became very weak and was unable to walk. Has been having some nausea denies any vomiting. Patient was unable to eat because of the nausea. Has been having cramping abdominal pain. Patient has not had any recent travel or sick contacts. Has had a TV dinner 6 days ago. In the ER patient was found to be hypotensive and was given 3 L fluid bolus. Lactic acid was elevated with creatinine markedly elevated from baseline with metabolic acidosis severe hypokalemia and hypomagnesemia. Patient has been admitted for sepsis from intra-abdominal source most likely gastroenteritis/colitis. On my exam patient's abdomen appears benign. Patient's blood pressure is still in the [id number] systolic. Patient is alert awake and following commands. Patient states her diarrhea was dark in color.  Review of Systems: As presented in the history of presenting illness, rest negative.  Past Medical History  Diagnosis Date  . Hypercholesteremia   . Anxiety   . Osteoporosis   . Breast cancer, IDC, Right, Stage I 11/07/2010  . Constipation    Past Surgical History  Procedure Laterality Date  . Breast lumpectomy w/ needle localization  11/26/2010    Right - Dr Margot Chimes  . Tumor removal  1960, 1970    benign tumor from bilateral breast  . Hemorrhoid surgery    . Tonsillectomy     Social History:  reports that she has never smoked. She has  never used smokeless tobacco. She reports that she does not drink alcohol or use illicit drugs. Where does patient live home. Can patient participate in ADLs? Yes.  Allergies  Allergen Reactions  . Iodine     Iodine IV  . Sulfa Antibiotics Rash    Family History:  Family History  Problem Relation Age of Onset  . Cancer Mother     lung  . Heart disease Mother   . Heart disease Father   . Heart disease Brother   . Heart disease Paternal Uncle   . Heart disease Maternal Grandmother   . Stroke Maternal Grandfather   . Heart disease Paternal Grandfather       Prior to Admission medications   Medication Sig Start Date End Date Taking? Authorizing Provider  atenolol (TENORMIN) 100 MG tablet Take 50 mg by mouth daily.  09/14/10  Yes Historical Provider, MD  atorvastatin (LIPITOR) 40 MG tablet Take 40 mg by mouth daily.  10/03/10  Yes Historical Provider, MD  cholecalciferol (VITAMIN D) 1000 UNITS tablet Take 1,000 Units by mouth daily.     Yes Historical Provider, MD  LORazepam (ATIVAN) 1 MG tablet Take 1 mg by mouth every 8 (eight) hours as needed.    Yes Historical Provider, MD  losartan-hydrochlorothiazide (HYZAAR) 50-12.5 MG per tablet Take 1 tablet by mouth daily.  10/05/10  Yes Historical Provider, MD  Misc Natural Products (LUTEIN 20 PO) Take 1 tablet by mouth daily.   Yes Historical Provider, MD  sertraline (ZOLOFT) 100 MG tablet 50 mg 1 day or 1 dose.  08/21/10  Yes Historical Provider, MD    Physical Exam: Filed Vitals:   01/29/15 1715 01/29/15 1730 01/29/15 1745 01/29/15 1804  BP: 93/58 90/53 86/47    Pulse: 97 98 92   Temp:    97.9 F (36.6 C)  TempSrc:    Rectal  Resp: 25 20 24    Weight:      SpO2: 94% 97% 95%      General:  Moderately built and nourished.  Eyes: Anicteric no pallor.  ENT: No discharge from the ears eyes nose and mouth.  Neck: No mass felt.  Cardiovascular: S1-S2 heard.  Respiratory: No rhonchi or crepitations.  Abdomen: Soft nontender  bowel sounds present. No guarding or rigidity.  Skin: No rash.  Musculoskeletal: No edema.  Psychiatric: Appears normal.  Neurologic: Alert awake oriented to time place and person. Moves all extremities.  Labs on Admission:  Basic Metabolic Panel:  Recent Labs Lab 01/29/15 1445  NA 129*  K 1.6*  CL 92*  CO2 21*  GLUCOSE 175*  BUN 40*  CREATININE 4.46*  CALCIUM 7.0*  MG 1.3*   Liver Function Tests:  Recent Labs Lab 01/29/15 1445  AST 98*  ALT 98*  ALKPHOS 170*  BILITOT 2.1*  PROT 6.2*  ALBUMIN 2.7*   No results for input(s): LIPASE, AMYLASE in the last 168 hours. No results for input(s): AMMONIA in the last 168 hours. CBC:  Recent Labs Lab 01/29/15 1445  WBC 6.1  NEUTROABS 5.4  HGB 13.0  HCT 37.2  MCV 77.2*  PLT 89*   Cardiac Enzymes:  Recent Labs Lab 01/29/15 1445  TROPONINI 0.15*    BNP (last 3 results) No results for input(s): BNP in the last 8760 hours.  ProBNP (last 3 results) No results for input(s): PROBNP in the last 8760 hours.  CBG: No results for input(s): GLUCAP in the last 168 hours.  Radiological Exams on Admission: Dg Chest Port 1 View  01/29/2015  CLINICAL DATA:  Hypotension hypothermia. History of breast cancer on the right. EXAM: PORTABLE CHEST 1 VIEW COMPARISON:  PA and lateral chest 11/09/2014 and 12/02/2014. FINDINGS: Streaky airspace disease is present in the right base appears and mildly increased compared to the prior study. The left lung is clear. Heart size is normal. No pneumothorax or pleural effusion. Marked thoracolumbar scoliosis is again seen. IMPRESSION: Some increase in streaky right basilar airspace opacities since the most recent examination could be due to atelectasis or infection. Electronically Signed   By: Inge Rise M.D.   On: 01/29/2015 15:45    EKG: Independently reviewed. Normal sinus rhythm with PVCs.  Assessment/Plan Principal Problem:   Sepsis ([place]) Active Problems:   ARF (acute renal  failure) (HCC)   Thrombocytopenia (HCC)   Hypokalemia   Elevated LFTs   1. Sepsis from intra-abdominal source most likely gastroenteritis/colitis - patient has been empirically placed on Cipro and Flagyl after blood cultures were obtained. Check stool for GI pathogens. Continue aggressive hydration. If patient's blood pressure does not improve with IV fluids then we will consult critical care. Hold antihypertensives. Patient may need CT abdomen and pelvis once stable. 2. Acute renal failure with metabolic acidosis - probably from hypotension dehydration and patient using in addition to her dehydration ARB. UA does not show any casts. Continue with aggressive hydration and hold antihypertensives and closely monitor intake output and metabolic panel. 3. Electrolyte imbalance including hypokalemia hypomagnesemia and hyponatremia - all of which could be attributed to patient's severe dehydration from diarrhea. Replace aggressively and  recheck electrolytes. 4. Thrombocytopenia probably from infectious causes. Closely monitor for DIC. 5. Hyperlipidemia - we'll continue statins as patient stable. 6. Hypotension presently hypotensive. Holding all antihypertensives. 7. History of breast cancer in remission.  I have reviewed patient's old charts and labs.   Addendum - I have consulted critical care since patient remains hypotensive despite multiple boluses of IV fluids.   DVT Prophylaxis SCDs due to thrombocytopenia. Code Status: Full code.  Family Communication: Patient's daughter and son. Patient's daughter and healthcare power of attorney.  Disposition Plan: Admit to inpatient.    Rashena Dowling N. [facility] Pager [phone].  If 7PM-7AM, please contact night-coverage [email or web address] Password [email or web address] 01/29/2015, 8:14 PM

## 2015-01-29 NOTE — ED Provider Notes (Addendum)
CSN: [id number]     Arrival date & time 01/29/15  1425 History   First MD Initiated Contact with Patient 01/29/15 1429     Chief Complaint  Patient presents with  . Diarrhea   level V caveat unstable vital signs   (Consider location/radiation/quality/duration/timing/severity/associated sxs/prior Treatment) HPI  Patient complains of diarrhea and generalized weakness gradual onset 4 days ago. She reports that diarrhea has become "dark" she denies pain anywhere denies fever. Treated with Lomotil and Imodium without relief. Denies abdominal pain she does admit to diminished appetite. No other associated symptoms.Marland Kitchen Upon arrival of EMS patient had blood pressure of 70/50. She was treated with saline 500 mL bolus en route. Past Medical History  Diagnosis Date  . Hypercholesteremia   . Anxiety   . Osteoporosis   . Breast cancer, IDC, Right, Stage I 11/07/2010  . Constipation    Past Surgical History  Procedure Laterality Date  . Breast lumpectomy w/ needle localization  11/26/2010    Right - Dr Margot Chimes  . Tumor removal  1960, 1970    benign tumor from bilateral breast  . Hemorrhoid surgery    . Tonsillectomy     Family History  Problem Relation Age of Onset  . Cancer Mother     lung  . Heart disease Mother   . Heart disease Father   . Heart disease Brother   . Heart disease Paternal Uncle   . Heart disease Maternal Grandmother   . Stroke Maternal Grandfather   . Heart disease Paternal Grandfather    Social History  Substance Use Topics  . Smoking status: Never Smoker   . Smokeless tobacco: Never Used  . Alcohol Use: No   OB History    No data available     Review of Systems  Unable to perform ROS: Unstable vital signs  Gastrointestinal: Positive for diarrhea.  Neurological: Positive for weakness.      Allergies  Iodine and Sulfa antibiotics  Home Medications   Prior to Admission medications   Medication Sig Start Date End Date Taking? Authorizing Provider   atenolol (TENORMIN) 100 MG tablet Take 50 mg by mouth daily.  09/14/10   Historical Provider, MD  atorvastatin (LIPITOR) 40 MG tablet Take 40 mg by mouth daily.  10/03/10   Historical Provider, MD  cholecalciferol (VITAMIN D) 1000 UNITS tablet Take 1,000 Units by mouth daily.      Historical Provider, MD  LORazepam (ATIVAN) 1 MG tablet Take 1 mg by mouth every 8 (eight) hours as needed.     Historical Provider, MD  losartan-hydrochlorothiazide (HYZAAR) 50-12.5 MG per tablet Take 1 tablet by mouth daily.  10/05/10   Historical Provider, MD  Misc Natural Products (LUTEIN 20 PO) Take 1 tablet by mouth daily.    Historical Provider, MD  senna (SENOKOT) 8.6 MG TABS Take 2 tablets by mouth at bedtime.     Historical Provider, MD  sertraline (ZOLOFT) 100 MG tablet 50 mg 1 day or 1 dose.  08/21/10   Historical Provider, MD   BP 84/52 mmHg  Pulse 84  Temp(Src) 98.3 F (36.8 C) (Rectal)  Resp 20  Wt 120 lb (54.432 kg)  SpO2 100% Physical Exam  Constitutional: She appears well-developed and well-nourished. She appears distressed.  Glasgow Coma Score 15 ill-appearing  HENT:  Head: Normocephalic and atraumatic.  Mucous membranes dry  Eyes: Conjunctivae are normal. Pupils are equal, round, and reactive to light.  Neck: Neck supple. No tracheal deviation present. No thyromegaly present.  Cardiovascular: Normal rate and regular rhythm.   No murmur heard. Pulmonary/Chest: Effort normal and breath sounds normal.  Abdominal: Soft. Bowel sounds are normal. She exhibits no distension. There is no tenderness.  Genitourinary:  Normal tone and brown stool  Musculoskeletal: Normal range of motion. She exhibits no edema or tenderness.  Neurological: She is alert. Coordination normal.  Skin: Skin is warm and dry. No rash noted.  cool, slightly mottled  Psychiatric: She has a normal mood and affect.  Nursing note and vitals reviewed.   ED Course  Procedures (including critical care time) Labs Review Labs  Reviewed  CULTURE, BLOOD (ROUTINE X 2)  CULTURE, BLOOD (ROUTINE X 2)  URINE CULTURE  STOOL CULTURE  C DIFFICILE QUICK SCREEN W PCR REFLEX  COMPREHENSIVE METABOLIC PANEL  CBC WITH DIFFERENTIAL/PLATELET  URINALYSIS, ROUTINE W REFLEX MICROSCOPIC (NOT AT [facility]  TROPONIN I  OCCULT BLOOD X 1 CARD TO LAB, STOOL  I-STAT CG4 LACTIC ACID, ED    Imaging Review No results found. I have personally reviewed and evaluated these images and lab results as part of my medical decision-making.   EKG Interpretation   Date/Time:  Sunday January 29 2015 14:55:02 EST Ventricular Rate:  76 PR Interval:  144 QRS Duration: 106 QT Interval:  384 QTC Calculation: 432 R Axis:   3 Text Interpretation:  Sinus rhythm with sinus arrhythmia with occasional  Premature ventricular complexes Incomplete right bundle branch block  Nonspecific ST and T wave abnormality Abnormal ECG pvcs  New since  previous tracing Confirmed by Winfred Leeds  MD, Rayonna Heldman [phone]) on 01/29/2015  3:00:11 PM      3:20 PM patient looks improved and feels improved after treatment with and or venous hydration and warming blanket Primary care physician is Dr. Maury Dus 3:45 PM patient continues to feel improved after treatment with intravenous fluids. She is in normal sinus rhythm on the cardiac monitor without ectopy. She is alert color has improved Glasgow Coma Score 15  Chest x-ray reviewed by me  Results for orders placed or performed during the hospital encounter of 01/29/15  Comprehensive metabolic panel  Result Value Ref Range   Sodium 129 (L) 135 - 145 mmol/L   Potassium 1.6 (LL) 3.5 - 5.1 mmol/L   Chloride 92 (L) 101 - 111 mmol/L   CO2 21 (L) 22 - 32 mmol/L   Glucose, Bld 175 (H) 65 - 99 mg/dL   BUN 40 (H) 6 - 20 mg/dL   Creatinine, Ser 4.46 (H) 0.44 - 1.00 mg/dL   Calcium 7.0 (L) 8.9 - 10.3 mg/dL   Total Protein 6.2 (L) 6.5 - 8.1 g/dL   Albumin 2.7 (L) 3.5 - 5.0 g/dL   AST 98 (H) 15 - 41 U/L   ALT 98 (H) 14 - 54 U/L    Alkaline Phosphatase 170 (H) 38 - 126 U/L   Total Bilirubin 2.1 (H) 0.3 - 1.2 mg/dL   GFR calc non Af Amer 8 (L) >60 mL/min   GFR calc Af Amer 10 (L) >60 mL/min   Anion gap 16 (H) 5 - 15  CBC WITH DIFFERENTIAL  Result Value Ref Range   WBC 6.1 4.0 - 10.5 K/uL   RBC 4.82 3.87 - 5.11 MIL/uL   Hemoglobin 13.0 12.0 - 15.0 g/dL   HCT 37.2 36.0 - 46.0 %   MCV 77.2 (L) 78.0 - 100.0 fL   MCH 27.0 26.0 - 34.0 pg   MCHC 34.9 30.0 - 36.0 g/dL   RDW 14.8 11.5 -  15.5 %   Platelets 89 (L) 150 - 400 K/uL   Neutrophils Relative % 21 %   Lymphocytes Relative 5 %   Monocytes Relative 6 %   Eosinophils Relative 0 %   Basophils Relative 0 %   Band Neutrophils 60 %   Metamyelocytes Relative 5 %   Myelocytes 3 %   Promyelocytes Absolute 0 %   Blasts 0 %   nRBC 0 0 /100 WBC   Neutro Abs 5.4 1.7 - 7.7 K/uL   Lymphs Abs 0.3 (L) 0.7 - 4.0 K/uL   Monocytes Absolute 0.4 0.1 - 1.0 K/uL   Eosinophils Absolute 0.0 0.0 - 0.7 K/uL   Basophils Absolute 0.0 0.0 - 0.1 K/uL   RBC Morphology ELLIPTOCYTES    WBC Morphology MILD LEFT SHIFT (1-5% METAS, OCC MYELO, OCC BANDS)    Smear Review LARGE PLATELETS PRESENT   Urinalysis, Routine w reflex microscopic (not at [facility])  Result Value Ref Range   Color, Urine AMBER (A) YELLOW   APPearance CLOUDY (A) CLEAR   Specific Gravity, Urine 1.012 1.005 - 1.030   pH 6.0 5.0 - 8.0   Glucose, UA NEGATIVE NEGATIVE mg/dL   Hgb urine dipstick NEGATIVE NEGATIVE   Bilirubin Urine SMALL (A) NEGATIVE   Ketones, ur 15 (A) NEGATIVE mg/dL   Protein, ur 100 (A) NEGATIVE mg/dL   Nitrite NEGATIVE NEGATIVE   Leukocytes, UA NEGATIVE NEGATIVE  Troponin I  Result Value Ref Range   Troponin I 0.15 (H) <0.031 ng/mL  Occult blood card to lab, stool Provider will collect  Result Value Ref Range   Fecal Occult Bld NEGATIVE NEGATIVE  Magnesium  Result Value Ref Range   Magnesium 1.3 (L) 1.7 - 2.4 mg/dL  Urine microscopic-add on  Result Value Ref Range   Squamous Epithelial / LPF 0-5  (A) NONE SEEN   WBC, UA 0-5 0 - 5 WBC/hpf   RBC / HPF NONE SEEN 0 - 5 RBC/hpf   Bacteria, UA MANY (A) NONE SEEN   Casts HYALINE CASTS (A) NEGATIVE  I-Stat CG4 Lactic Acid, ED  (not at  [facility])  Result Value Ref Range   Lactic Acid, Venous 3.27 (HH) 0.5 - 2.0 mmol/L   Comment NOTIFIED PHYSICIAN    Dg Chest Port 1 View  01/29/2015  CLINICAL DATA:  Hypotension hypothermia. History of breast cancer on the right. EXAM: PORTABLE CHEST 1 VIEW COMPARISON:  PA and lateral chest 11/09/2014 and 12/02/2014. FINDINGS: Streaky airspace disease is present in the right base appears and mildly increased compared to the prior study. The left lung is clear. Heart size is normal. No pneumothorax or pleural effusion. Marked thoracolumbar scoliosis is again seen. IMPRESSION: Some increase in streaky right basilar airspace opacities since the most recent examination could be due to atelectasis or infection. Electronically Signed   By: Inge Rise M.D.   On: 01/29/2015 15:45    MDM   Code sepsis called based on hypotension, lactic acidosisI spoke with Dr.Abrol plan is to transfer patient to [facility], stepdown unit. IV hydration. Intravenous and oral potassium supplementation. Intravenous magnesium supplementation. We will treat diarrhea empirically with Cipro and Flagyl. Final diagnoses:  None   Dx #1 severe sepsis #2 dehydration #3 renal failure #4hypokalemia #5 hyponatremia #6 hypocalcemia #7 pvcs #8 elevated lfts #9hyperbilirubinemia CRITICAL CARE Performed by: Orlie Dakin Total critical care time: 65 minutes Critical care time was exclusive of separately billable procedures and treating other patients. Critical care was necessary to treat or prevent imminent  or life-threatening deterioration. Critical care was time spent personally by me on the following activities: development of treatment plan with patient and/or surrogate as well as nursing, discussions with consultants, evaluation  of patient's response to treatment, examination of patient, obtaining history from patient or surrogate, ordering and performing treatments and interventions, ordering and review of laboratory studies, ordering and review of radiographic studies, pulse oximetry and re-evaluation of patient's condition.    Orlie Dakin, MD 01/29/15 1641  410 pm pt c/o diffuse headache morphine ordered.   510 pm pt reports headache improved . She declined pain medication. Morphine order cancelled  Orlie Dakin, MD 01/29/15 1709

## 2015-01-29 NOTE — ED Notes (Signed)
Larkin Ina EMT speaking with Dr. Tamera Punt regarding current blood pressure.

## 2015-01-30 ENCOUNTER — Inpatient Hospital Stay (HOSPITAL_COMMUNITY): Payer: PPO

## 2015-01-30 DIAGNOSIS — E876 Hypokalemia: Secondary | ICD-10-CM

## 2015-01-30 DIAGNOSIS — R6520 Severe sepsis without septic shock: Secondary | ICD-10-CM

## 2015-01-30 DIAGNOSIS — N179 Acute kidney failure, unspecified: Secondary | ICD-10-CM

## 2015-01-30 DIAGNOSIS — R7881 Bacteremia: Secondary | ICD-10-CM

## 2015-01-30 DIAGNOSIS — A419 Sepsis, unspecified organism: Secondary | ICD-10-CM

## 2015-01-30 DIAGNOSIS — D696 Thrombocytopenia, unspecified: Secondary | ICD-10-CM

## 2015-01-30 DIAGNOSIS — R7989 Other specified abnormal findings of blood chemistry: Secondary | ICD-10-CM

## 2015-01-30 LAB — BLOOD GAS, ARTERIAL
ACID-BASE DEFICIT: 14.8 mmol/L — AB (ref 0.0–2.0)
BICARBONATE: 11.1 meq/L — AB (ref 20.0–24.0)
DRAWN BY: 283401
O2 CONTENT: 3 L/min
O2 Saturation: 94.3 %
PCO2 ART: 27.1 mmHg — AB (ref 35.0–45.0)
PO2 ART: 82.9 mmHg (ref 80.0–100.0)
Patient temperature: 98.6
TCO2: 12 mmol/L (ref 0–100)
pH, Arterial: 7.238 — ABNORMAL LOW (ref 7.350–7.450)

## 2015-01-30 LAB — COMPREHENSIVE METABOLIC PANEL
ALT: 79 U/L — ABNORMAL HIGH (ref 14–54)
ANION GAP: 13 (ref 5–15)
AST: 76 U/L — AB (ref 15–41)
Albumin: 1.9 g/dL — ABNORMAL LOW (ref 3.5–5.0)
Alkaline Phosphatase: 120 U/L (ref 38–126)
BILIRUBIN TOTAL: 1.9 mg/dL — AB (ref 0.3–1.2)
BUN: 39 mg/dL — ABNORMAL HIGH (ref 6–20)
CALCIUM: 6.2 mg/dL — AB (ref 8.9–10.3)
CO2: 15 mmol/L — ABNORMAL LOW (ref 22–32)
Chloride: 107 mmol/L (ref 101–111)
Creatinine, Ser: 3.8 mg/dL — ABNORMAL HIGH (ref 0.44–1.00)
GFR, EST AFRICAN AMERICAN: 12 mL/min — AB (ref >60)
GFR, EST NON AFRICAN AMERICAN: 10 mL/min — AB (ref >60)
Glucose, Bld: 110 mg/dL — ABNORMAL HIGH (ref 65–99)
POTASSIUM: 3 mmol/L — AB (ref 3.5–5.1)
Sodium: 135 mmol/L (ref 135–145)
TOTAL PROTEIN: 4.7 g/dL — AB (ref 6.5–8.1)

## 2015-01-30 LAB — CBC WITH DIFFERENTIAL/PLATELET
BASOS ABS: 0 10*3/uL (ref 0.0–0.1)
BASOS PCT: 0 %
EOS ABS: 0.1 10*3/uL (ref 0.0–0.7)
Eosinophils Relative: 1 %
HCT: 33 % — ABNORMAL LOW (ref 36.0–46.0)
Hemoglobin: 11.6 g/dL — ABNORMAL LOW (ref 12.0–15.0)
Lymphocytes Relative: 6 %
Lymphs Abs: 0.4 10*3/uL — ABNORMAL LOW (ref 0.7–4.0)
MCH: 27.5 pg (ref 26.0–34.0)
MCHC: 35.2 g/dL (ref 30.0–36.0)
MCV: 78.2 fL (ref 78.0–100.0)
MONO ABS: 0.9 10*3/uL (ref 0.1–1.0)
Monocytes Relative: 12 %
NEUTROS PCT: 81 %
Neutro Abs: 5.8 10*3/uL (ref 1.7–7.7)
PLATELETS: 78 10*3/uL — AB (ref 150–400)
RBC: 4.22 MIL/uL (ref 3.87–5.11)
RDW: 15.7 % — ABNORMAL HIGH (ref 11.5–15.5)
WBC: 7.2 10*3/uL (ref 4.0–10.5)

## 2015-01-30 LAB — BASIC METABOLIC PANEL
ANION GAP: 13 (ref 5–15)
BUN: 43 mg/dL — ABNORMAL HIGH (ref 6–20)
CHLORIDE: 105 mmol/L (ref 101–111)
CO2: 14 mmol/L — ABNORMAL LOW (ref 22–32)
Calcium: 6.3 mg/dL — CL (ref 8.9–10.3)
Creatinine, Ser: 3.73 mg/dL — ABNORMAL HIGH (ref 0.44–1.00)
GFR calc Af Amer: 12 mL/min — ABNORMAL LOW (ref >60)
GFR, EST NON AFRICAN AMERICAN: 10 mL/min — AB (ref >60)
GLUCOSE: 94 mg/dL (ref 65–99)
POTASSIUM: 3.1 mmol/L — AB (ref 3.5–5.1)
Sodium: 132 mmol/L — ABNORMAL LOW (ref 135–145)

## 2015-01-30 LAB — GLUCOSE, CAPILLARY
GLUCOSE-CAPILLARY: 117 mg/dL — AB (ref 65–99)
GLUCOSE-CAPILLARY: 92 mg/dL (ref 65–99)

## 2015-01-30 LAB — PROTIME-INR
INR: 1.33 (ref 0.00–1.49)
INR: 1.39 (ref 0.00–1.49)
PROTHROMBIN TIME: 17.1 s — AB (ref 11.6–15.2)
Prothrombin Time: 16.6 seconds — ABNORMAL HIGH (ref 11.6–15.2)

## 2015-01-30 LAB — TROPONIN I
Troponin I: 0.09 ng/mL — ABNORMAL HIGH (ref <0.031)
Troponin I: 0.11 ng/mL — ABNORMAL HIGH (ref <0.031)

## 2015-01-30 LAB — C DIFFICILE QUICK SCREEN W PCR REFLEX
C DIFFICILE (CDIFF) INTERP: NEGATIVE
C DIFFICILE (CDIFF) TOXIN: NEGATIVE
C Diff antigen: NEGATIVE

## 2015-01-30 LAB — LACTIC ACID, PLASMA: Lactic Acid, Venous: 2.1 mmol/L (ref 0.5–2.0)

## 2015-01-30 LAB — MAGNESIUM: MAGNESIUM: 3.1 mg/dL — AB (ref 1.7–2.4)

## 2015-01-30 LAB — FIBRINOGEN: Fibrinogen: 692 mg/dL — ABNORMAL HIGH (ref 204–475)

## 2015-01-30 LAB — PROCALCITONIN: PROCALCITONIN: 33.73 ng/mL

## 2015-01-30 LAB — APTT: APTT: 36 s (ref 24–37)

## 2015-01-30 LAB — LACTATE DEHYDROGENASE: LDH: 263 U/L — ABNORMAL HIGH (ref 98–192)

## 2015-01-30 MED ORDER — METRONIDAZOLE IN NACL 5-0.79 MG/ML-% IV SOLN
500.0000 mg | Freq: Three times a day (TID) | INTRAVENOUS | Status: DC
  Administered 2015-01-30 – 2015-02-02 (×10): 500 mg via INTRAVENOUS
  Filled 2015-01-30 (×12): qty 100

## 2015-01-30 MED ORDER — DEXTROSE 5 % IV SOLN
1.0000 g | INTRAVENOUS | Status: DC
  Administered 2015-01-30 – 2015-02-07 (×9): 1 g via INTRAVENOUS
  Filled 2015-01-30 (×10): qty 1

## 2015-01-30 MED ORDER — SODIUM CHLORIDE 0.9 % IV BOLUS (SEPSIS): 2000.0000 mL | Freq: Once | INTRAVENOUS | Status: DC

## 2015-01-30 MED ORDER — POTASSIUM CHLORIDE CRYS ER 20 MEQ PO TBCR
40.0000 meq | EXTENDED_RELEASE_TABLET | Freq: Two times a day (BID) | ORAL | Status: DC
  Filled 2015-01-30: qty 2

## 2015-01-30 MED ORDER — POTASSIUM CHLORIDE 10 MEQ/100ML IV SOLN
10.0000 meq | INTRAVENOUS | Status: AC
  Administered 2015-01-30 (×4): 10 meq via INTRAVENOUS
  Filled 2015-01-30 (×4): qty 100

## 2015-01-30 MED ORDER — LOPERAMIDE HCL 1 MG/5ML PO LIQD
2.0000 mg | ORAL | Status: DC | PRN
  Administered 2015-01-30 (×2): 2 mg via ORAL
  Filled 2015-01-30 (×4): qty 10

## 2015-01-30 MED ORDER — CETYLPYRIDINIUM CHLORIDE 0.05 % MT LIQD
7.0000 mL | Freq: Two times a day (BID) | OROMUCOSAL | Status: DC
  Administered 2015-01-30 – 2015-02-09 (×19): 7 mL via OROMUCOSAL

## 2015-01-30 MED ORDER — SODIUM CHLORIDE 0.9 % IV SOLN
2.0000 g | Freq: Once | INTRAVENOUS | Status: AC
  Administered 2015-01-30: 2 g via INTRAVENOUS
  Filled 2015-01-30: qty 20

## 2015-01-30 MED ORDER — SODIUM CHLORIDE 0.9 % IV SOLN: 250.0000 mL | INTRAVENOUS | Status: DC | PRN

## 2015-01-30 MED ORDER — LACTATED RINGERS IV SOLN
INTRAVENOUS | Status: DC
  Administered 2015-01-30: 22:00:00 via INTRAVENOUS
  Administered 2015-01-30: 150 mL/h via INTRAVENOUS
  Administered 2015-01-31: 06:00:00 via INTRAVENOUS

## 2015-01-30 MED ORDER — BARIUM SULFATE 2.1 % PO SUSP
ORAL | Status: AC
  Filled 2015-01-30: qty 2

## 2015-01-30 MED ORDER — LORAZEPAM 2 MG/ML IJ SOLN
1.0000 mg | Freq: Three times a day (TID) | INTRAMUSCULAR | Status: DC | PRN
  Administered 2015-01-30: 1 mg via INTRAVENOUS
  Filled 2015-01-30: qty 1

## 2015-01-30 MED ORDER — SODIUM CHLORIDE 0.9 % IV BOLUS (SEPSIS)
1000.0000 mL | Freq: Once | INTRAVENOUS | Status: AC
  Administered 2015-01-30: 1000 mL via INTRAVENOUS

## 2015-01-30 MED ORDER — MAGNESIUM SULFATE 4 GM/100ML IV SOLN
4.0000 g | Freq: Once | INTRAVENOUS | Status: AC
  Administered 2015-01-30: 4 g via INTRAVENOUS
  Filled 2015-01-30: qty 100

## 2015-01-30 MED ORDER — STERILE WATER FOR INJECTION IV SOLN
INTRAVENOUS | Status: DC
  Administered 2015-01-30: 01:00:00 via INTRAVENOUS
  Filled 2015-01-30 (×3): qty 850

## 2015-01-30 MED ORDER — POTASSIUM CHLORIDE 10 MEQ/100ML IV SOLN: 10.0000 meq | INTRAVENOUS | Status: DC

## 2015-01-30 NOTE — Progress Notes (Addendum)
CRITICAL VALUE ALERT  Critical value received:positive  blood cultures, all 4 BC gram neg rods  Date of notification: 01/30/15  Time of notification:  0732  Critical value read back: yes  Nurse who received alert:  LBass  MD notified (1st page):  Dr Ancil Linsey, CCM  Time of first page:  0735  MD notified (2nd page):  Time of second page:  Responding MD: Dr Ancil Linsey  Time MD responded:  [phone]

## 2015-01-30 NOTE — Progress Notes (Signed)
ANTIBIOTIC CONSULT NOTE - FOLLOW UP  Pharmacy Consult for Ceftazidime Indication: r/o intra-abdominal infection and GNR bacteremia   Allergies  Allergen Reactions  . Iodine     Iodine IV  . Sulfa Antibiotics Rash   Patient Measurements: Height: 4\' 11"  (149.9 cm) Weight: 139 lb 15.9 oz (63.5 kg) IBW/kg (Calculated) : 43.2 Vital Signs: Temp: 98 F (36.7 C) (11/28 0718) Temp Source: Oral (11/28 0718) BP: 97/49 mmHg (11/28 0700) Pulse Rate: 79 (11/28 0700) Intake/Output from previous day: 11/27 0701 - 11/28 0700 In: 8865 [P.O.:120; I.V.:495; IV Piggyback:8250] Out: 775 [Urine:100; Stool:675] Intake/Output from this shift:   Labs:  Recent Labs  01/29/15 1445 01/29/15 2019 01/29/15 2303  WBC 6.1  --  6.8  HGB 13.0  --  13.1  PLT 89*  --  69*  CREATININE 4.46* 4.17* 3.76*   Estimated Creatinine Clearance: 9.5 mL/min (by C-G formula based on Cr of 3.76). No results for input(s): VANCOTROUGH, VANCOPEAK, VANCORANDOM, GENTTROUGH, GENTPEAK, GENTRANDOM, TOBRATROUGH, TOBRAPEAK, TOBRARND, AMIKACINPEAK, AMIKACINTROU, AMIKACIN in the last 72 hours.   Microbiology: Recent Results (from the past 720 hour(s))  Blood Culture (routine x 2)     Status: None (Preliminary result)   Collection Time: 01/29/15  2:45 PM  Result Value Ref Range Status   Specimen Description BLOOD LEFT ARM  Final   Special Requests   Final    BOTTLES DRAWN AEROBIC AND ANAEROBIC 5CC BOTH BOTTLES   Culture  Setup Time   Final    GRAM NEGATIVE RODS IN BOTH AEROBIC AND ANAEROBIC BOTTLES CRITICAL RESULT CALLED TO, READ BACK BY AND VERIFIED WITH: L. FLOOD,RN AT 0730 ON [id number] BY Rhea Bleacher Performed at [facility]    Culture PENDING  Incomplete   Report Status PENDING  Incomplete  Blood Culture (routine x 2)     Status: None (Preliminary result)   Collection Time: 01/29/15  2:45 PM  Result Value Ref Range Status   Specimen Description BLOOD RIGHT ARM  Final   Special Requests   Final    BOTTLES  DRAWN AEROBIC AND ANAEROBIC 5CC BOTH BOTTLES   Culture  Setup Time   Final    GRAM NEGATIVE RODS IN BOTH AEROBIC AND ANAEROBIC BOTTLES CRITICAL RESULT CALLED TO, READ BACK BY AND VERIFIED WITH: L. FLOOD,RN AT 0730 ON [id number] BY Rhea Bleacher Performed at [facility]    Culture PENDING  Incomplete   Report Status PENDING  Incomplete  MRSA PCR Screening     Status: None   Collection Time: 01/29/15  7:10 PM  Result Value Ref Range Status   MRSA by PCR NEGATIVE NEGATIVE Final    Comment:        The GeneXpert MRSA Assay (FDA approved for NASAL specimens only), is one component of a comprehensive MRSA colonization surveillance program. It is not intended to diagnose MRSA infection nor to guide or monitor treatment for MRSA infections.   C difficile quick scan w PCR reflex     Status: None   Collection Time: 01/29/15 10:45 PM  Result Value Ref Range Status   C Diff antigen NEGATIVE NEGATIVE Final   C Diff toxin NEGATIVE NEGATIVE Final   C Diff interpretation Negative for toxigenic C. difficile  Final    Anti-infectives    Start     Dose/Rate Route Frequency Ordered Stop   01/30/15 1600  ciprofloxacin (CIPRO) IVPB 400 mg  Status:  Discontinued     400 mg 200 mL/hr over 60 Minutes Intravenous Every 24  hours 01/29/15 2019 01/30/15 0737   01/30/15 0745  metroNIDAZOLE (FLAGYL) IVPB 500 mg     500 mg 100 mL/hr over 60 Minutes Intravenous 3 times per day 01/30/15 0736     01/30/15 0200  metroNIDAZOLE (FLAGYL) IVPB 500 mg  Status:  Discontinued     500 mg 100 mL/hr over 60 Minutes Intravenous Every 8 hours 01/29/15 2014 01/30/15 0736   01/29/15 1615  metroNIDAZOLE (FLAGYL) IVPB 500 mg    Comments:  Diarrhea   500 mg 100 mL/hr over 60 Minutes Intravenous  Once 01/29/15 1613 01/29/15 1847   01/29/15 1615  ciprofloxacin (CIPRO) IVPB 400 mg    Comments:  Diarrhea   400 mg 200 mL/hr over 60 Minutes Intravenous  Once 01/29/15 1613 01/29/15 1751      Assessment: 81 year old  female known to pharmacy for cipro + flagyl dosing in r/o intra-abdominal infection to change cipro to ceftazidime this AM with new GNR in blood cultures.   LA down 3.27>>2.7. PCT 37. Tm 99.1.  ARF + non-gap metabolic acidosis- NS + Bicarb @ 100. SCr down 3.76. Marginal UOP overnight.   Cipro 11/27 >> 11/28 Fortaz 11/28 >> Flagyl 11/27 >>  11/27 Urine>> 11/27 Blood >> GNR 11/27 Cdiff ag + toxin - negative  Goal of Therapy:  clinical resolution of infection  Plan:  D/C Cipro. Ceftazidime 1g IV every 24 hours.  Monitor renal function, clinical status, and culture results.   Sloan Leiter, PharmD, BCPS Clinical Pharmacist [phone] 01/30/2015,8:07 AM

## 2015-01-30 NOTE — Progress Notes (Signed)
PULMONARY / CRITICAL CARE MEDICINE HISTORY AND PHYSICAL EXAMINATION   Name: Jenna [name] MRN: [id number] DOB: 09/28/1931    ADMISSION DATE:  01/29/2015  PRIMARY SERVICE: PCCM  CHIEF COMPLAINT:  Diarrhea  BRIEF PATIENT DESCRIPTION: 81 y/o woman with 5 day history of diarrhea presents with malaise, weakness, renal failure, metabolic acidosis, hypotension and thrombocytopenia.  STUDIES:  EKG: NSR CXR: Significant scoliosis, hypoinflated  SIGNIFICANT EVENTS: 11/27: transferred to [facility] from [facility] due to ARF, metabolic acidosis, hypotension  LINES / TUBES: PIVs  CULTURES:  Blood culture 11/27: GNR  Stool studies pending 11/27 Urine Cx pending 11/27 C diff Ag and toxin 11/27: neg  ANTIBIOTICS: Cipro 11/27-11/28  Flagy 11/17 >> Ceftaz 11/28 >>  SUBJECTIVE: Patient continues to have large amounts of watery stool.   REVIEW OF SYSTEMS:  All negative except decreased appetite, weakness, nausea.  VITAL SIGNS: Temp:  [97.1 F (36.2 C)-99.1 F (37.3 C)] 98 F (36.7 C) (11/28 0718) Pulse Rate:  [77-207] 79 (11/28 0700) Resp:  [15-29] 24 (11/28 0700) BP: (70-117)/(38-77) 97/49 mmHg (11/28 0700) SpO2:  [67 %-100 %] 99 % (11/28 0700) Weight:  [119 lb 14.9 oz (54.4 kg)-139 lb 15.9 oz (63.5 kg)] 139 lb 15.9 oz (63.5 kg) (11/28 0210) HEMODYNAMICS:   VENTILATOR SETTINGS:   INTAKE / OUTPUT: Intake/Output      11/27 0701 - 11/28 0700 11/28 0701 - 11/29 0700   P.O. 120    I.V. (mL/kg) 595 (9.4) 100 (1.6)   IV Piggyback 8250    Total Intake(mL/kg) 8965 (141.2) 100 (1.6)   Urine (mL/kg/hr) 100    Stool 675 1 (0)   Total Output 775 1   Net +8190 +99          PHYSICAL EXAMINATION: General:  Elderly woman in NAD  Neuro:  Awake, alert, slow to answer questions. No focal deficits. HEENT:  Dry MM.  Cardiovascular:  RRR, no m/r/g noted. Lungs:  CTAB  Abdomen:  Hyperactive bowel sounds. Mildly distended/soft. No TTP.  Musculoskeletal:  No swollen joints Skin:  No  Rashes  LABS:  CBC  Recent Labs Lab 01/29/15 1445 01/29/15 2303  WBC 6.1 6.8  HGB 13.0 13.1  HCT 37.2 37.6  PLT 89* 69*   Coag's  Recent Labs Lab 01/30/15 0300  INR 1.33   BMET  Recent Labs Lab 01/29/15 1445 01/29/15 2019 01/29/15 2303  NA 129* 131* 133*  K 1.6* <2.0* 2.0*  CL 92* 100* 107  CO2 21* 18* 14*  BUN 40* 35* 34*  CREATININE 4.46* 4.17* 3.76*  GLUCOSE 175* 124* 114*   Electrolytes  Recent Labs Lab 01/29/15 1445 01/29/15 2019 01/29/15 2303  CALCIUM 7.0* 6.8* 6.2*  MG 1.3* 1.6*  --    Sepsis Markers  Recent Labs Lab 01/29/15 1456 01/29/15 2019 01/29/15 2303  LATICACIDVEN 3.27* 3.8* 2.7*  PROCALCITON  --  36.76  --    ABG  Recent Labs Lab 01/30/15 0010  PHART 7.238*  PCO2ART 27.1*  PO2ART 82.9   Liver Enzymes  Recent Labs Lab 01/29/15 1445  AST 98*  ALT 98*  ALKPHOS 170*  BILITOT 2.1*  ALBUMIN 2.7*   Cardiac Enzymes  Recent Labs Lab 01/29/15 1445 01/29/15 2019 01/30/15 0237  TROPONINI 0.15* 0.13* 0.09*   Glucose  Recent Labs Lab 01/30/15 0103 01/30/15 0216  GLUCAP 117* 92    Imaging Dg Chest Port 1 View  01/29/2015  CLINICAL DATA:  Hypotension hypothermia. History of breast cancer on the right. EXAM: PORTABLE CHEST 1  VIEW COMPARISON:  PA and lateral chest 11/09/2014 and 12/02/2014. FINDINGS: Streaky airspace disease is present in the right base appears and mildly increased compared to the prior study. The left lung is clear. Heart size is normal. No pneumothorax or pleural effusion. Marked thoracolumbar scoliosis is again seen. IMPRESSION: Some increase in streaky right basilar airspace opacities since the most recent examination could be due to atelectasis or infection. Electronically Signed   By: Inge Rise M.D.   On: 01/29/2015 15:45    ASSESSMENT / PLAN:  Principal Problem:   Sepsis ([place]) Active Problems:   ARF (acute renal failure) (HCC)   Thrombocytopenia (HCC)   Hypokalemia   Elevated  LFTs   PULMONARY A: No active issues P:   Continue to monitor   CARDIOVASCULAR A: Hypovolemic hypotension- stable  P:   Hold home antihypertensives Continue to monitor, stable without pressors  RENAL A: Acute Kidney Injury Hypokalemia Hyponatremia hypomagnesia  Metabolic Acidosis P:   Aggressive hydration w/ NS + Bicarb gtt for NAGMA tx. Likely due to volume / bicarb / K losses from diarrhea. BMET pending for this AM, will most likely require runs of KCl IV  S/p4mg  mag sulfate    GASTROINTESTINAL A: Diarrhea P:   Supportive care. Toxic w/u in process. C diff negative. Enteric precautions Clear liquid diet  HEMATOLOGIC A: Thrombocytopenia - DIC vs splenic sequestration vs marrow suppression Schistocytes seen on smear - concerns for low grade DIC P:   Most likely from acute infectious etiology  No d-dimer done on DIC panel  INFECTIOUS A: Gram negative bacteremia- possible urinary source Possible infectious diarrhea P:   W/u in process Given GNR in blood culture, d/c cipro and added ceftaz Could consider abdominal imaging.   ENDOCRINE A: No active issues P:    NEUROLOGIC A: Metabolic encephalopathy  P:  Supportive care  BEST PRACTICE / DISPOSITION Level of Care:  ICU Primary Service:  PCCM Consultants:  None Code Status:  Full Diet:  Liquid DVT Px:  None (thrombocytopenia) GI Px:  None Skin Integrity:  Intact Social / Family:  Updated   Archie Patten, MD [facility] Resident  01/30/2015, 8:35 AM

## 2015-01-30 NOTE — Progress Notes (Signed)
Utilization review completed. Clarabel Marion, RN, BSN. 

## 2015-01-30 NOTE — Progress Notes (Signed)
eLink Physician-Brief Progress Note Patient Name: Jenna [name] DOB: 1931/03/29 MRN: [id number]   Date of Service  01/30/2015  HPI/Events of Note  Multiple issues: 1. Oliguria, 2. K+ =3.1 and Creatinine = 3.8 and 3. Ca++ = 6.3 and albumin = 1.9. Ca++ corrects to 7.98. Clinical setting of ongoing diarrhea.   eICU Interventions  Will order: 1. Bolus with 0.9 NaCl 1 liter IV over 1 hour now.  2. Replete Ca++ and K+.     Intervention Category Intermediate Interventions: Oliguria - evaluation and management;Electrolyte abnormality - evaluation and management  Sommer,Steven Eugene 01/30/2015, 4:35 PM

## 2015-01-30 NOTE — Progress Notes (Signed)
Pt and family request Pt receive sleeping pill. Discussed with Georgann Housekeeper NP---order received. Will continue to monitor

## 2015-01-30 NOTE — Progress Notes (Signed)
PCCM Interval Progress Note  Events:  Pt requesting home dose Ativan to help with sleep as she has been unable to rest at all.  Interventions.  Order home dose Ativan PRN.   Montey Hora, [place] [facility] [place] [facility] Pager: [phone]  or [phone] 01/30/2015, 8:18 PM

## 2015-01-30 NOTE — Progress Notes (Signed)
Dr. Lorenso Courier was notified of Lactic Acid = 2.1.  No new orders.

## 2015-01-30 NOTE — H&P (Signed)
PULMONARY / CRITICAL CARE MEDICINE HISTORY AND PHYSICAL EXAMINATION   Name: Jenna [name] MRN: [id number] DOB: 1931/07/01    ADMISSION DATE:  01/29/2015  PRIMARY SERVICE: PCCM  CHIEF COMPLAINT:  Diarrhea  BRIEF PATIENT DESCRIPTION: 81 y/o woman with 5 day history of diarrhea presents with malaise, weakness, renal failure, metabolic acidosis, hypotension and thrombocytopenia.  SIGNIFICANT EVENTS / STUDIES:   LINES / TUBES: PIVs  CULTURES: Stool studies pending 11/27 Urine Cx pending 11/27  ANTIBIOTICS: Cipro/Flagyl  HISTORY OF PRESENT ILLNESS:   Jenna [name] is an 81 y/o woman w/ past hx of breast CA who presented to [facility] w/ hx of diarrhea since Tuesday. She has had poor PO intake and has felt weak, and eventually presented to the hospital when she started having some confusion. On admission, she was found to have acute renal failure as well as severe hypokalemia, and hypotension. She was transferred to [facility], and after fluid resuscitation, remained hypotensive with metabolic disarray and lactic acidosis. She was transferred to [facility] service.  PAST MEDICAL HISTORY :  Past Medical History  Diagnosis Date  . Hypercholesteremia   . Anxiety   . Osteoporosis   . Breast cancer, IDC, Right, Stage I 11/07/2010  . Constipation    Past Surgical History  Procedure Laterality Date  . Breast lumpectomy w/ needle localization  11/26/2010    Right - Dr Margot Chimes  . Tumor removal  1960, 1970    benign tumor from bilateral breast  . Hemorrhoid surgery    . Tonsillectomy     Prior to Admission medications   Medication Sig Start Date End Date Taking? Authorizing Provider  atenolol (TENORMIN) 100 MG tablet Take 50 mg by mouth daily.  09/14/10  Yes Historical Provider, MD  atorvastatin (LIPITOR) 40 MG tablet Take 40 mg by mouth daily.  10/03/10  Yes Historical Provider, MD  cholecalciferol (VITAMIN D) 1000 UNITS tablet Take 1,000 Units by mouth daily.     Yes Historical  Provider, MD  LORazepam (ATIVAN) 1 MG tablet Take 1 mg by mouth every 8 (eight) hours as needed.    Yes Historical Provider, MD  losartan-hydrochlorothiazide (HYZAAR) 50-12.5 MG per tablet Take 1 tablet by mouth daily.  10/05/10  Yes Historical Provider, MD  Misc Natural Products (LUTEIN 20 PO) Take 1 tablet by mouth daily.   Yes Historical Provider, MD  sertraline (ZOLOFT) 100 MG tablet 50 mg 1 day or 1 dose.  08/21/10  Yes Historical Provider, MD   Allergies  Allergen Reactions  . Iodine     Iodine IV  . Sulfa Antibiotics Rash    FAMILY HISTORY:  Family History  Problem Relation Age of Onset  . Cancer Mother     lung  . Heart disease Mother   . Heart disease Father   . Heart disease Brother   . Heart disease Paternal Uncle   . Heart disease Maternal Grandmother   . Stroke Maternal Grandfather   . Heart disease Paternal Grandfather    SOCIAL HISTORY:  reports that she has never smoked. She has never used smokeless tobacco. She reports that she does not drink alcohol or use illicit drugs.  REVIEW OF SYSTEMS:  Per HPI.  SUBJECTIVE:   VITAL SIGNS: Temp:  [97.1 F (36.2 C)-99.1 F (37.3 C)] 99.1 F (37.3 C) (11/27 2000) Pulse Rate:  [82-207] 83 (11/27 2330) Resp:  [17-26] 22 (11/27 2330) BP: (70-117)/(38-77) 93/45 mmHg (11/27 2330) SpO2:  [67 %-100 %] 100 % (11/27 2330) Weight:  [  119 lb 14.9 oz (54.4 kg)-120 lb (54.432 kg)] 119 lb 14.9 oz (54.4 kg) (11/27 1900) HEMODYNAMICS:   VENTILATOR SETTINGS:   INTAKE / OUTPUT: Intake/Output      11/27 0701 - 11/28 0700   P.O. 120   IV Piggyback 5650   Total Intake(mL/kg) 5770 (106.1)   Net +5770         PHYSICAL EXAMINATION: General:  Elderly woman in NAD Neuro:  Awake, alert, mild confusion. No focal deficits. HEENT:  Dry MM Neck: NO masses Cardiovascular:  RRR Lungs:  CTA Abdomen:  Hyperactive bowel sounds Musculoskeletal:  No swollen joints Skin:  No Rashes  LABS:  CBC  Recent Labs Lab 01/29/15 1445  01/29/15 2303  WBC 6.1 6.8  HGB 13.0 13.1  HCT 37.2 37.6  PLT 89* 69*   Coag's No results for input(s): APTT, INR in the last 168 hours. BMET  Recent Labs Lab 01/29/15 1445 01/29/15 2019 01/29/15 2303  NA 129* 131* 133*  K 1.6* <2.0* 2.0*  CL 92* 100* 107  CO2 21* 18* 14*  BUN 40* 35* 34*  CREATININE 4.46* 4.17* 3.76*  GLUCOSE 175* 124* 114*   Electrolytes  Recent Labs Lab 01/29/15 1445 01/29/15 2019 01/29/15 2303  CALCIUM 7.0* 6.8* 6.2*  MG 1.3* 1.6*  --    Sepsis Markers  Recent Labs Lab 01/29/15 1456 01/29/15 2019 01/29/15 2303  LATICACIDVEN 3.27* 3.8* 2.7*  PROCALCITON  --  36.76  --    ABG  Recent Labs Lab 01/30/15 0010  PHART 7.238*  PCO2ART 27.1*  PO2ART 82.9   Liver Enzymes  Recent Labs Lab 01/29/15 1445  AST 98*  ALT 98*  ALKPHOS 170*  BILITOT 2.1*  ALBUMIN 2.7*   Cardiac Enzymes  Recent Labs Lab 01/29/15 1445 01/29/15 2019  TROPONINI 0.15* 0.13*   Glucose No results for input(s): GLUCAP in the last 168 hours.  Imaging Dg Chest Port 1 View  01/29/2015  CLINICAL DATA:  Hypotension hypothermia. History of breast cancer on the right. EXAM: PORTABLE CHEST 1 VIEW COMPARISON:  PA and lateral chest 11/09/2014 and 12/02/2014. FINDINGS: Streaky airspace disease is present in the right base appears and mildly increased compared to the prior study. The left lung is clear. Heart size is normal. No pneumothorax or pleural effusion. Marked thoracolumbar scoliosis is again seen. IMPRESSION: Some increase in streaky right basilar airspace opacities since the most recent examination could be due to atelectasis or infection. Electronically Signed   By: Inge Rise M.D.   On: 01/29/2015 15:45    EKG: NSR CXR: Significant scoliosis, hypoinflated  ASSESSMENT / PLAN:  Principal Problem:   Sepsis ([place]) Active Problems:   ARF (acute renal failure) (HCC)   Thrombocytopenia (HCC)   Hypokalemia   Elevated LFTs   PULMONARY A: No  active issues P:    CARDIOVASCULAR A: Hypovolemic hypotension P:   Hold home antihypertensives  RENAL A: Acute Kidney Injury Hypokalemia Hyponatremia Metabolic Acidosis P:   Aggressive hydration w/ NS + Bicarb gtt for NAGMA tx. Likely due to volume / bicarb / K losses from diarrhea.  GASTROINTESTINAL A: Diarrhea P:   Supportive care. Toxic w/u in process. Emperic tx w/ Cipro/Fagyl for now.  HEMATOLOGIC A: Thrombocytopenia Schistocytes seen on smear P:   DIC w/u in process. Hb stable, so not likely hemolytic process, but ldh/haptoglobin in process.  INFECTIOUS A: Possible infectious diarrhea P:   W/u in process, empiric tx w/ cipro/flagyl.  ENDOCRINE A: No active issues P:  NEUROLOGIC A: Metabolic encephalopathy  P:  Supportive care  BEST PRACTICE / DISPOSITION Level of Care:  ICU Primary Service:  PCCM Consultants:  None Code Status:  Full Diet:  Liquid DVT Px:  None (thrombocytopenia) GI Px:  None Skin Integrity:  Intact Social / Family:  Updated  TODAY'S SUMMARY: 81 y/o woman with diarrhea p/w hypotension, hypokalemia, NAGMA, and thrombocytopenia  I have personally obtained a history, examined the patient, evaluated laboratory and imaging results, formulated the assessment and plan and placed orders.  CRITICAL CARE: The patient is critically ill with multiple organ systems failure and requires high complexity decision making for assessment and support, frequent evaluation and titration of therapies, application of advanced monitoring technologies and extensive interpretation of multiple databases. Critical Care Time devoted to patient care services described in this note is 100 minutes.   Luz Brazen, MD Pulmonary and [place] Pager: [phone]   01/30/2015, 1:14 AM

## 2015-01-30 NOTE — Progress Notes (Signed)
CRITICAL VALUE ALERT  Critical value received:  Lactic acid 2.7  Date of notification:  01/30/15  Time of notification:  2350  Critical value read back: Yes  Nurse who received alert:  Remo Lipps, RN  MD notified (1st page):  Hal Hope   Time of first page:  0002  Responding MD:  Hal Hope   Time MD responded:  [phone]

## 2015-01-30 NOTE — Progress Notes (Signed)
CRITICAL VALUE ALERT  Critical value received: CA 6.2  Date of notification:  01/30/15  Time of notification: 0925  Critical value read back: yes  Nurse who received alert: LBass  MD notified (1st page): Dr Lorenso Courier  Time of first page:  0925  MD notified (2nd page):  Time of second page:  Responding MD:  Dr Lorenso Courier  Time MD responded:  [phone]

## 2015-01-31 ENCOUNTER — Inpatient Hospital Stay (HOSPITAL_COMMUNITY): Payer: PPO

## 2015-01-31 DIAGNOSIS — A419 Sepsis, unspecified organism: Secondary | ICD-10-CM

## 2015-01-31 LAB — CBC WITH DIFFERENTIAL/PLATELET
BASOS ABS: 0.1 10*3/uL (ref 0.0–0.1)
BASOS PCT: 1 %
EOS ABS: 0.1 10*3/uL (ref 0.0–0.7)
Eosinophils Relative: 1 %
HCT: 37.5 % (ref 36.0–46.0)
Hemoglobin: 12.8 g/dL (ref 12.0–15.0)
LYMPHS PCT: 10 %
Lymphs Abs: 1.4 10*3/uL (ref 0.7–4.0)
MCH: 26.8 pg (ref 26.0–34.0)
MCHC: 34.1 g/dL (ref 30.0–36.0)
MCV: 78.6 fL (ref 78.0–100.0)
MONO ABS: 2 10*3/uL — AB (ref 0.1–1.0)
Monocytes Relative: 15 %
NEUTROS ABS: 9.9 10*3/uL — AB (ref 1.7–7.7)
NEUTROS PCT: 73 %
PLATELETS: 76 10*3/uL — AB (ref 150–400)
RBC: 4.77 MIL/uL (ref 3.87–5.11)
RDW: 16 % — AB (ref 11.5–15.5)
WBC: 13.5 10*3/uL — ABNORMAL HIGH (ref 4.0–10.5)

## 2015-01-31 LAB — URINE CULTURE
Culture: NO GROWTH
Culture: NO GROWTH

## 2015-01-31 LAB — HAPTOGLOBIN: Haptoglobin: 258 mg/dL — ABNORMAL HIGH (ref 34–200)

## 2015-01-31 LAB — RENAL FUNCTION PANEL
Albumin: 1.9 g/dL — ABNORMAL LOW (ref 3.5–5.0)
Anion gap: 14 (ref 5–15)
BUN: 46 mg/dL — AB (ref 6–20)
CHLORIDE: 109 mmol/L (ref 101–111)
CO2: 11 mmol/L — ABNORMAL LOW (ref 22–32)
CREATININE: 3.57 mg/dL — AB (ref 0.44–1.00)
Calcium: 6.9 mg/dL — ABNORMAL LOW (ref 8.9–10.3)
GFR calc Af Amer: 13 mL/min — ABNORMAL LOW (ref >60)
GFR calc non Af Amer: 11 mL/min — ABNORMAL LOW (ref >60)
GLUCOSE: 105 mg/dL — AB (ref 65–99)
Phosphorus: 2.3 mg/dL — ABNORMAL LOW (ref 2.5–4.6)
Potassium: 4.1 mmol/L (ref 3.5–5.1)
Sodium: 134 mmol/L — ABNORMAL LOW (ref 135–145)

## 2015-01-31 LAB — ADAMTS13 ACTIVITY: Adamts 13 Activity: 48 % — ABNORMAL LOW (ref >66)

## 2015-01-31 LAB — C3 COMPLEMENT: C3 COMPLEMENT: 118 mg/dL (ref 82–167)

## 2015-01-31 LAB — C4 COMPLEMENT: Complement C4, Body Fluid: 36 mg/dL (ref 14–44)

## 2015-01-31 LAB — MAGNESIUM: MAGNESIUM: 2.9 mg/dL — AB (ref 1.7–2.4)

## 2015-01-31 LAB — LACTIC ACID, PLASMA: LACTIC ACID, VENOUS: 2 mmol/L (ref 0.5–2.0)

## 2015-01-31 LAB — PROCALCITONIN: PROCALCITONIN: 22.63 ng/mL

## 2015-01-31 LAB — ADAMTS13 ACTIVITY REFLEX

## 2015-01-31 MED ORDER — RACEPINEPHRINE HCL 2.25 % IN NEBU
0.5000 mL | INHALATION_SOLUTION | Freq: Once | RESPIRATORY_TRACT | Status: AC
  Administered 2015-01-31: 0.5 mL via RESPIRATORY_TRACT
  Filled 2015-01-31: qty 0.5

## 2015-01-31 MED ORDER — FUROSEMIDE 10 MG/ML IJ SOLN
20.0000 mg | Freq: Two times a day (BID) | INTRAMUSCULAR | Status: DC
  Administered 2015-01-31: 20 mg via INTRAVENOUS
  Filled 2015-01-31: qty 2

## 2015-01-31 MED ORDER — SERTRALINE HCL 100 MG PO TABS
50.0000 mg | ORAL_TABLET | Freq: Every morning | ORAL | Status: DC
  Administered 2015-01-31 – 2015-02-04 (×5): 50 mg via ORAL
  Filled 2015-01-31 (×5): qty 1

## 2015-01-31 NOTE — Progress Notes (Signed)
  Echocardiogram 2D Echocardiogram has been performed.  Jennette Dubin 01/31/2015, 10:07 AM

## 2015-01-31 NOTE — Progress Notes (Signed)
Patient noted to have stridor on exam which is new. Noted some difficulty breathing last night.  Still satting 97-99% on 2L [place] Audible stridor on exam that can be heard from the door; sound does not appear to be originating from the nasal passages.   Portable CXR  Trial of racemic epi Continue to monitor   Archie Patten, MD [facility] Resident  01/31/2015, 5:57 AM

## 2015-01-31 NOTE — Progress Notes (Signed)
Went to evaluate patient after racemic epi treatment. Feels it may have been somewhat helpful.  Still satting 96-99% on 2L and still having some increased WOB. No tongue swelling. Unable to visualize the posterior oropharynx.  Oddly enough, inspiratory stridor is now an expiratory stridor with intermittent wheeze.   No evidence of anaphylaxis currently.  Patient is up 10L this hospitalization with poor UOP. Will f/u CXR, if evidence of volume overload will consider trial of Lasix Continue to monitor closely.  Archie Patten, MD [facility] Resident  01/31/2015, 6:35 AM

## 2015-01-31 NOTE — Progress Notes (Signed)
PULMONARY / CRITICAL CARE MEDICINE HISTORY AND PHYSICAL EXAMINATION   Name: Jenna [name] MRN: [id number] DOB: 04/14/1931    ADMISSION DATE:  01/29/2015  PRIMARY SERVICE: PCCM  CHIEF COMPLAINT:  Diarrhea  BRIEF PATIENT DESCRIPTION: 81 y/o woman with 5 day history of diarrhea presents with malaise, weakness, renal failure, metabolic acidosis, hypotension and thrombocytopenia.  STUDIES:  EKG: NSR CXR: Significant scoliosis, hypoinflated CT abd/pelvis: Bilateral pleural effusions with lower lobe consolidation and atelectasis, moderate to severe gas and fluid distension of the stomach. No dilated small bowel. Fluid throughout the colon compatible with diarrhea.  Nonspecific body wall stranding, predominantly nondependent so consider cellulitis in addition to anasarca  SIGNIFICANT EVENTS: 11/27: transferred to [facility] from [facility] due to ARF, metabolic acidosis, hypotension 11/28: stridor on exam  LINES / TUBES: PIVs  CULTURES:  Blood culture 11/27: GNR  Stool studies pending 11/27 Urine Cx 11/27: NGTD  C diff Ag and toxin 11/27: neg  ANTIBIOTICS: Cipro 11/27-11/28  Flagy 11/27 >> Ceftaz 11/28 >>  SUBJECTIVE: Patient continues to have large amounts of watery stool.   REVIEW OF SYSTEMS:  All negative except decreased appetite, weakness, nausea.  VITAL SIGNS: Temp:  [97.3 F (36.3 C)-98.5 F (36.9 C)] 97.3 F (36.3 C) (11/29 0330) Pulse Rate:  [66-83] 78 (11/29 0600) Resp:  [16-34] 16 (11/29 0500) BP: (87-126)/(45-63) 117/59 mmHg (11/29 0600) SpO2:  [95 %-100 %] 100 % (11/29 0607) Weight:  [152 lb 12.5 oz (69.3 kg)] 152 lb 12.5 oz (69.3 kg) (11/29 0500) HEMODYNAMICS:   VENTILATOR SETTINGS:   INTAKE / OUTPUT: Intake/Output      11/28 0701 - 11/29 0700   P.O. 100   I.V. (mL/kg) 3200 (46.2)   IV Piggyback 1270   Total Intake(mL/kg) 4570 (65.9)   Urine (mL/kg/hr) 441 (0.3)   Stool 1170 (0.7)   Total Output 1611   Net +2959         PHYSICAL  EXAMINATION: General:  Elderly woman lying in bed sleeping initially with audible stridor noted at the door, noted to be  in mild distress once awake and trying to speak.  Neuro:  Alert, slow to answer questions. No focal deficits. HEENT:  Dry MM. Tongue not swollen, unable to visualize posterior oropharynx. Cardiovascular:  RRR, no m/r/g noted. Trace pitting edema noted.  Lungs:  Expiratory stridor/wheeze which seems like upper airway. No crackles noted. Mild increased work of breathing on 2L [place].  Abdomen:  Hyperactive bowel sounds. Mildly distended/soft. No TTP.  Musculoskeletal:  No swollen joints Skin:  No Rashes  LABS:  CBC  Recent Labs Lab 01/29/15 2303 01/30/15 0805 01/31/15 0306  WBC 6.8 7.2 13.5*  HGB 13.1 11.6* 12.8  HCT 37.6 33.0* 37.5  PLT 69* 78* 76*   Coag's  Recent Labs Lab 01/30/15 0300 01/30/15 0805  APTT  --  36  INR 1.33 1.39   BMET  Recent Labs Lab 01/30/15 0805 01/30/15 1525 01/31/15 0306  NA 135 132* 134*  K 3.0* 3.1* 4.1  CL 107 105 109  CO2 15* 14* 11*  BUN 39* 43* 46*  CREATININE 3.80* 3.73* 3.57*  GLUCOSE 110* 94 105*   Electrolytes  Recent Labs Lab 01/29/15 2019  01/30/15 0805 01/30/15 1525 01/31/15 0306  CALCIUM 6.8*  < > 6.2* 6.3* 6.9*  MG 1.6*  --   --  3.1* 2.9*  PHOS  --   --   --   --  2.3*  < > = values in this interval not displayed.  Sepsis Markers  Recent Labs Lab 01/29/15 2019 01/29/15 2303 01/30/15 0805 01/30/15 1525 01/31/15 0306  LATICACIDVEN 3.8* 2.7*  --  2.1* 2.0  PROCALCITON 36.76  --  33.73  --  22.63   ABG  Recent Labs Lab 01/30/15 0010  PHART 7.238*  PCO2ART 27.1*  PO2ART 82.9   Liver Enzymes  Recent Labs Lab 01/29/15 1445 01/30/15 0805 01/31/15 0306  AST 98* 76*  --   ALT 98* 79*  --   ALKPHOS 170* 120  --   BILITOT 2.1* 1.9*  --   ALBUMIN 2.7* 1.9* 1.9*   Cardiac Enzymes  Recent Labs Lab 01/29/15 2019 01/30/15 0237 01/30/15 0805  TROPONINI 0.13* 0.09* 0.11*    Glucose  Recent Labs Lab 01/30/15 0103 01/30/15 0216  GLUCAP 117* 92    Imaging Ct Abdomen Pelvis Wo Contrast  01/30/2015  CLINICAL DATA:  81 year old female with nausea vomiting and diarrhea for 6 days. Sepsis. Personal history of breast cancer. EXAM: CT ABDOMEN AND PELVIS WITHOUT CONTRAST TECHNIQUE: Multidetector CT imaging of the abdomen and pelvis was performed following the standard protocol without IV contrast. COMPARISON:  CT chest abdomen and pelvis 11/21/2010. FINDINGS: Moderate scoliosis. There are some chronic appearing right costovertebral angle rib fractures which are new since 2012, but no acute No acute osseous abnormality identified. Small to moderate layering bilateral pleural effusions associated with bilateral lower lobe consolidation and/or atelectasis. No pericardial effusion. Postoperative changes to the right breast with multiple surgical clips noted. Foley catheter in place an the urinary bladder is decompressed. No pelvic free fluid. Negative noncontrast uterus and adnexa. Fluid in the distal colon. Diverticulosis with fluid in the left colon. Fluid and gas in the transverse and right colon. All of these segments are only mildly distended. The distal small bowel is decompressed. No dilated small bowel. There is moderate to severe distension of the stomach with gas and fluid. No enteric tube identified. The duodenum appears to be decompressed. No free air in the abdomen. No abdominal free fluid identified. Noncontrast liver, gallbladder, spleen, pancreas and adrenal glands appear stable and within normal limits. Aortoiliac calcified atherosclerosis noted. No acute renal findings are evident. No lymphadenopathy identified. There is some bilateral flank and abdominal wall stranding which could be related to anasarca, but is predominantly non dependent. IMPRESSION: 1. Bilateral pleural effusions with lower lobe consolidation and atelectasis. 2. Moderate to severe gas and fluid  distension of the stomach consider NG tube decompression. No dilated small bowel. Fluid throughout the colon compatible with diarrhea. 3. Nonspecific body wall stranding, predominantly nondependent so consider cellulitis in addition to anasarca. Electronically Signed   By: Genevie Ann M.D.   On: 01/30/2015 15:09   Dg Chest Port 1 View  01/29/2015  CLINICAL DATA:  Hypotension hypothermia. History of breast cancer on the right. EXAM: PORTABLE CHEST 1 VIEW COMPARISON:  PA and lateral chest 11/09/2014 and 12/02/2014. FINDINGS: Streaky airspace disease is present in the right base appears and mildly increased compared to the prior study. The left lung is clear. Heart size is normal. No pneumothorax or pleural effusion. Marked thoracolumbar scoliosis is again seen. IMPRESSION: Some increase in streaky right basilar airspace opacities since the most recent examination could be due to atelectasis or infection. Electronically Signed   By: Inge Rise M.D.   On: 01/29/2015 15:45    ASSESSMENT / PLAN:  Principal Problem:   Sepsis ([place]) Active Problems:   ARF (acute renal failure) (HCC)   Thrombocytopenia (HCC)   Hypokalemia  Elevated LFTs   Sepsis with multiple organ dysfunction (MOD) (HCC)   Bacteremia   PULMONARY A: Increased work of breathing without hypoxia/tachypnia  P:   S/p 1 treatment of racemic epi CXR portable pending  Patient is up 10L this hospitalization, curious if this could be secondary to hypervolemia  If continues to have increased WOB, will start BiPAP.  Continue to monitor   CARDIOVASCULAR A: Hypovolemic hypotension- stable  P:   Hold home antihypertensives Continue to monitor, stable without pressors  RENAL A: Acute Kidney Injury- concerns for ATN given poor UOP Hypokalemia- resolved  Hyponatremia- improving  Hypomagnesia- resolved  Non-anion gap metabolic Acidosis- Likely due to volume / bicarb / K losses from diarrhea. P:   Hydration with lactated ringers at  150cc/hr F/u AM BMET Consider diuresing pending CXR findings this AM.    GASTROINTESTINAL A: Diarrhea Gas and fluid distension of stomach P:   Supportive care for diarrhea.  GI pathogen panel pending. C diff negative. Enteric precautions Clear liquid diet Consider placing NGT for stomach decompression  HEMATOLOGIC A: Thrombocytopenia - DIC vs splenic sequestration vs marrow suppression Schistocytes seen on smear - concerns for low grade DIC P:   Most likely from acute infectious etiology  No evidence of bleeding Stable, continue to monitor   INFECTIOUS A: Gram negative bacteremia- possible urinary source vs GI transposition  Possible infectious diarrhea Concerns for abdominal wall cellulitis noted on CT abdomen? Procalcitonin trending down 33.73>22.63 P:   W/u in process Ceftaz day 2, Flagyl day 2   ENDOCRINE A: No active issues P:    NEUROLOGIC A: Metabolic encephalopathy- possibly secondary to sepsis P:  Supportive care  BEST PRACTICE / DISPOSITION Level of Care:  ICU Primary Service:  PCCM Consultants:  None Code Status:  Full Diet:  Liquid DVT Px:  None (thrombocytopenia) GI Px:  None Skin Integrity:  Intact Social / Family:  Updated   Archie Patten, MD [facility] Resident  01/31/2015, 6:48 AM

## 2015-02-01 DIAGNOSIS — J9601 Acute respiratory failure with hypoxia: Secondary | ICD-10-CM

## 2015-02-01 DIAGNOSIS — A09 Infectious gastroenteritis and colitis, unspecified: Secondary | ICD-10-CM

## 2015-02-01 DIAGNOSIS — Z853 Personal history of malignant neoplasm of breast: Secondary | ICD-10-CM

## 2015-02-01 DIAGNOSIS — A4189 Other specified sepsis: Secondary | ICD-10-CM

## 2015-02-01 DIAGNOSIS — D72829 Elevated white blood cell count, unspecified: Secondary | ICD-10-CM

## 2015-02-01 LAB — CBC WITH DIFFERENTIAL/PLATELET
BASOS ABS: 0.2 10*3/uL — AB (ref 0.0–0.1)
BASOS PCT: 1 %
EOS ABS: 0.3 10*3/uL (ref 0.0–0.7)
Eosinophils Relative: 2 %
HCT: 33.7 % — ABNORMAL LOW (ref 36.0–46.0)
Hemoglobin: 12.1 g/dL (ref 12.0–15.0)
LYMPHS PCT: 4 %
Lymphs Abs: 0.6 10*3/uL — ABNORMAL LOW (ref 0.7–4.0)
MCH: 27.3 pg (ref 26.0–34.0)
MCHC: 35.9 g/dL (ref 30.0–36.0)
MCV: 75.9 fL — ABNORMAL LOW (ref 78.0–100.0)
Monocytes Absolute: 0.9 10*3/uL (ref 0.1–1.0)
Monocytes Relative: 6 %
NEUTROS PCT: 87 %
Neutro Abs: 13.1 10*3/uL — ABNORMAL HIGH (ref 1.7–7.7)
PLATELETS: 83 10*3/uL — AB (ref 150–400)
RBC: 4.44 MIL/uL (ref 3.87–5.11)
RDW: 16.6 % — ABNORMAL HIGH (ref 11.5–15.5)
WBC: 15.1 10*3/uL — ABNORMAL HIGH (ref 4.0–10.5)

## 2015-02-01 LAB — MAGNESIUM: MAGNESIUM: 2.6 mg/dL — AB (ref 1.7–2.4)

## 2015-02-01 LAB — RENAL FUNCTION PANEL
ALBUMIN: 1.9 g/dL — AB (ref 3.5–5.0)
Anion gap: 10 (ref 5–15)
BUN: 49 mg/dL — AB (ref 6–20)
CO2: 15 mmol/L — ABNORMAL LOW (ref 22–32)
CREATININE: 3.13 mg/dL — AB (ref 0.44–1.00)
Calcium: 7.5 mg/dL — ABNORMAL LOW (ref 8.9–10.3)
Chloride: 110 mmol/L (ref 101–111)
GFR calc Af Amer: 15 mL/min — ABNORMAL LOW (ref >60)
GFR, EST NON AFRICAN AMERICAN: 13 mL/min — AB (ref >60)
Glucose, Bld: 107 mg/dL — ABNORMAL HIGH (ref 65–99)
PHOSPHORUS: 2.5 mg/dL (ref 2.5–4.6)
Potassium: 3.3 mmol/L — ABNORMAL LOW (ref 3.5–5.1)
SODIUM: 135 mmol/L (ref 135–145)

## 2015-02-01 LAB — PROCALCITONIN: PROCALCITONIN: 8.97 ng/mL

## 2015-02-01 MED ORDER — POTASSIUM CHLORIDE CRYS ER 20 MEQ PO TBCR
40.0000 meq | EXTENDED_RELEASE_TABLET | Freq: Once | ORAL | Status: AC
  Administered 2015-02-01: 40 meq via ORAL
  Filled 2015-02-01: qty 2

## 2015-02-01 MED ORDER — WHITE PETROLATUM GEL
Status: AC
  Administered 2015-02-01: 1
  Filled 2015-02-01: qty 1

## 2015-02-01 NOTE — Progress Notes (Signed)
PULMONARY / CRITICAL CARE MEDICINE HISTORY AND PHYSICAL EXAMINATION   Name: Jenna [name] MRN: [id number] DOB: [birth year]    ADMISSION DATE:  01/29/2015  PRIMARY SERVICE: PCCM  CHIEF COMPLAINT:  Diarrhea  BRIEF PATIENT DESCRIPTION: 81 y/o woman with 5 day history of diarrhea presents with malaise, weakness, renal failure, metabolic acidosis, hypotension and thrombocytopenia.  STUDIES:  EKG: NSR CXR: Significant scoliosis, hypoinflated CT abd/pelvis: Bilateral pleural effusions with lower lobe consolidation and atelectasis, moderate to severe gas and fluid distension of the stomach. No dilated small bowel. Fluid throughout the colon compatible with diarrhea.  Nonspecific body wall stranding, predominantly nondependent so consider cellulitis in addition to anasarca TTE: EF 65-70%, grade 1 DD, trace TR CXR: pulmonary edema.   SIGNIFICANT EVENTS: 11/27: transferred to [facility] from [facility] due to ARF, metabolic acidosis, hypotension 11/28: stridor, then wheezing on exam after racemic epi 11/29: CXR with pulmonary edema, 1 dose of Lasix.   LINES / TUBES: PIVs  CULTURES:  Blood culture 11/27: GNR  Stool studies pending 11/27 Urine Cx 11/27: Negative C diff Ag and toxin 11/27: neg  ANTIBIOTICS: Cipro 11/27-11/28  Flagy 11/27 >> Ceftaz 11/28 >>  SUBJECTIVE: Patient doing well. More alert, breathing more comfortably. Doesn't think she's having watery BMs any more.   REVIEW OF SYSTEMS:  All negative except fatigue.  VITAL SIGNS: Temp:  [97.4 F (36.3 C)-99.6 F (37.6 C)] 98.1 F (36.7 C) (11/30 0335) Pulse Rate:  [55-106] 87 (11/30 0500) Resp:  [16-25] 18 (11/30 0600) BP: (94-142)/(46-90) 135/62 mmHg (11/30 0600) SpO2:  [89 %-100 %] 95 % (11/30 0600) Weight:  [153 lb 7 oz (69.6 kg)] 153 lb 7 oz (69.6 kg) (11/30 0335) HEMODYNAMICS:   VENTILATOR SETTINGS:   INTAKE / OUTPUT: Intake/Output      11/29 0701 - 11/30 0700   I.V. (mL/kg) 875 (12.6)   IV Piggyback 350    Total Intake(mL/kg) 1225 (17.6)   Urine (mL/kg/hr) 1483 (0.9)   Total Output 1483   Net -258         PHYSICAL EXAMINATION: General:  Elderly woman lying in bed in NAD. Neuro:  Alert, slow to answer questions but appropriate. No focal deficits. Oriented to person, place, month, and year. HEENT:   MMM.  Cardiovascular:  RRR, no m/r/g noted. Trace pitting edema noted.  Lungs:  Breathing comfortably on 2L [place] without accessory muscle use. CTAB. On repeat exam after patient up talking with her daughter, noted to have some accessory muscle use.  Abdomen:  Hyperactive bowel sounds. Mildly distended/soft. No TTP.  Musculoskeletal:  No swollen joints Skin:  No Rashes  LABS:  CBC  Recent Labs Lab 01/29/15 2303 01/30/15 0805 01/31/15 0306  WBC 6.8 7.2 13.5*  HGB 13.1 11.6* 12.8  HCT 37.6 33.0* 37.5  PLT 69* 78* 76*   Coag's  Recent Labs Lab 01/30/15 0300 01/30/15 0805  APTT  --  36  INR 1.33 1.39   BMET  Recent Labs Lab 01/30/15 0805 01/30/15 1525 01/31/15 0306  NA 135 132* 134*  K 3.0* 3.1* 4.1  CL 107 105 109  CO2 15* 14* 11*  BUN 39* 43* 46*  CREATININE 3.80* 3.73* 3.57*  GLUCOSE 110* 94 105*   Electrolytes  Recent Labs Lab 01/29/15 2019  01/30/15 0805 01/30/15 1525 01/31/15 0306  CALCIUM 6.8*  < > 6.2* 6.3* 6.9*  MG 1.6*  --   --  3.1* 2.9*  PHOS  --   --   --   --  2.3*  < > = values in this interval not displayed. Sepsis Markers  Recent Labs Lab 01/29/15 2019 01/29/15 2303 01/30/15 0805 01/30/15 1525 01/31/15 0306  LATICACIDVEN 3.8* 2.7*  --  2.1* 2.0  PROCALCITON 36.76  --  33.73  --  22.63   ABG  Recent Labs Lab 01/30/15 0010  PHART 7.238*  PCO2ART 27.1*  PO2ART 82.9   Liver Enzymes  Recent Labs Lab 01/29/15 1445 01/30/15 0805 01/31/15 0306  AST 98* 76*  --   ALT 98* 79*  --   ALKPHOS 170* 120  --   BILITOT 2.1* 1.9*  --   ALBUMIN 2.7* 1.9* 1.9*   Cardiac Enzymes  Recent Labs Lab 01/29/15 2019 01/30/15 0237  01/30/15 0805  TROPONINI 0.13* 0.09* 0.11*   Glucose  Recent Labs Lab 01/30/15 0103 01/30/15 0216  GLUCAP 117* 92    Imaging Ct Abdomen Pelvis Wo Contrast  01/30/2015  CLINICAL DATA:  81 year old female with nausea vomiting and diarrhea for 6 days. Sepsis. Personal history of breast cancer. EXAM: CT ABDOMEN AND PELVIS WITHOUT CONTRAST TECHNIQUE: Multidetector CT imaging of the abdomen and pelvis was performed following the standard protocol without IV contrast. COMPARISON:  CT chest abdomen and pelvis 11/21/2010. FINDINGS: Moderate scoliosis. There are some chronic appearing right costovertebral angle rib fractures which are new since 2012, but no acute No acute osseous abnormality identified. Small to moderate layering bilateral pleural effusions associated with bilateral lower lobe consolidation and/or atelectasis. No pericardial effusion. Postoperative changes to the right breast with multiple surgical clips noted. Foley catheter in place an the urinary bladder is decompressed. No pelvic free fluid. Negative noncontrast uterus and adnexa. Fluid in the distal colon. Diverticulosis with fluid in the left colon. Fluid and gas in the transverse and right colon. All of these segments are only mildly distended. The distal small bowel is decompressed. No dilated small bowel. There is moderate to severe distension of the stomach with gas and fluid. No enteric tube identified. The duodenum appears to be decompressed. No free air in the abdomen. No abdominal free fluid identified. Noncontrast liver, gallbladder, spleen, pancreas and adrenal glands appear stable and within normal limits. Aortoiliac calcified atherosclerosis noted. No acute renal findings are evident. No lymphadenopathy identified. There is some bilateral flank and abdominal wall stranding which could be related to anasarca, but is predominantly non dependent. IMPRESSION: 1. Bilateral pleural effusions with lower lobe consolidation and  atelectasis. 2. Moderate to severe gas and fluid distension of the stomach consider NG tube decompression. No dilated small bowel. Fluid throughout the colon compatible with diarrhea. 3. Nonspecific body wall stranding, predominantly nondependent so consider cellulitis in addition to anasarca. Electronically Signed   By: Genevie Ann M.D.   On: 01/30/2015 15:09   Dg Chest Port 1 View  01/31/2015  CLINICAL DATA:  [place] EXAM: PORTABLE CHEST 1 VIEW COMPARISON:  01/29/2015 FINDINGS: Hypoventilation.  Decreased lung volume with bibasilar atelectasis. Increase in pulmonary vascular congestion and bilateral effusions. Findings consistent with heart failure. IMPRESSION: Worsening heart failure with edema and bilateral effusions Hypoventilation with bibasilar atelectasis. Electronically Signed   By: Franchot Gallo M.D.   On: 01/31/2015 07:28    ASSESSMENT / PLAN:  Principal Problem:   Sepsis ([place]) Active Problems:   ARF (acute renal failure) (HCC)   Thrombocytopenia (HCC)   Hypokalemia   Elevated LFTs   Sepsis with multiple organ dysfunction (MOD) (HCC)   Bacteremia   PULMONARY A: Acute hypoxic respiratory failure- resolved, breathing comfortably this AM.  Was most likely secondary to hypervolemia and pulmonary edema.  P:   Continue supplemental O2   Continue to monitor  May consider diuresis if patient remains stable   CARDIOVASCULAR A: Hypovolemic hypotension- resolved  Hypervolemia P:   Hold home antihypertensives Continue to monitor, stable off pressors If patient remains stable and continues to improve, may consider diuresis in the future  RENAL A: Acute Kidney Injury- concerns for ATN given poor UOP, however good UOP with Lasix. Hypokalemia- resolved  Hyponatremia- improving  Hypomagnesia- resolved  Non-anion gap metabolic Acidosis- Likely due to volume / bicarb / K losses from diarrhea. P:   Fluids KVO F/u AM BMET- still pending  GASTROINTESTINAL A: Diarrhea Gas and fluid  distension of stomach P:   Supportive care for diarrhea.  GI pathogen panel pending. C diff negative. Enteric precautions Clear liquid diet, consider ADAT  HEMATOLOGIC A: Thrombocytopenia - DIC (appears mild)  vs splenic sequestration Schistocytes seen on smear - concerns for low grade DIC P:   Most likely from acute infectious etiology  No evidence of bleeding Stable, continue to monitor   INFECTIOUS A: Gram negative bacteremia- most likely GI transposition as urine cx negative Possible infectious diarrhea Procalcitonin trending down 33.73>22.63 P:   F/u blood cultures Ceftaz day 3, Flagyl day 4  Procalcitonin algorithm   ENDOCRINE A: No active issues P:    NEUROLOGIC A: Metabolic encephalopathy- possibly secondary to sepsis, improved P:  Supportive care  BEST PRACTICE / DISPOSITION Level of Care:  ICU Primary Service:  PCCM Consultants:  None Code Status:  Full Diet:  Liquid DVT Px:  None (thrombocytopenia) GI Px:  None Skin Integrity:  Intact Social / Family:  Updated   Archie Patten, MD [facility] Resident  02/01/2015, 6:22 AM

## 2015-02-01 NOTE — Consult Note (Signed)
[facility]  Total days of antibiotics 4        Day 3 ceftaz        Day 4 metronidazole               Reason for Consult: aeromonas bacteremia    Referring Physician: nestor  Principal Problem:   Sepsis (HCC) Active Problems:   ARF (acute renal failure) (HCC)   Thrombocytopenia (HCC)   Hypokalemia   Elevated LFTs   Sepsis with multiple organ dysfunction (MOD) (HCC)   Bacteremia    HPI: Jenna [name] is a 81 y.o. female who presented to OSH with 5 day hx of diarrhea, poor PO intake, malaise and weakness.  she was found to have acute renal failure as well as severe hypokalemia, and hypotension. seh reports symptoms coming on quickly, no recent travel, no sick contacts. She was transferred to [facility], and after fluid resuscitation, remained hypotensive with metabolic disarray and lactic acidosis. She has not had fever, day 4 of ceftaz plus metronidazole. Her blood cx grew out GNR, later identified as areomonas. abd CT did not show any evidence of intra-abd abscess. Course complicated by some demand ischemia, ongoing AKI. cxr showing bilateral effusion and pulmonary vascular congestion. Lab work in the last 2 days show increasing leukocytosis from 7.2 to 15K with left shift.  Past Medical History  Diagnosis Date  . Hypercholesteremia   . Anxiety   . Osteoporosis   . Breast cancer, IDC, Right, Stage I 11/07/2010  . Constipation     Allergies:  Allergies  Allergen Reactions  . Iodine Itching and Rash    Iodine IV  . Sulfa Antibiotics Itching and Rash    MEDICATIONS: . antiseptic oral rinse  7 mL Mouth Rinse BID  . cefTAZidime (FORTAZ)  IV  1 g Intravenous Q24H  . metronidazole  500 mg Intravenous 3 times per day  . potassium chloride  40 mEq Oral Once  . sertraline  50 mg Oral q morning - 10a  . sodium chloride  2,000 mL Intravenous Once    Social History  Substance Use Topics  . Smoking status: Never Smoker   . Smokeless tobacco: Never Used    . Alcohol Use: No    Family History  Problem Relation Age of Onset  . Cancer Mother     lung  . Heart disease Mother   . Heart disease Father   . Heart disease Brother   . Heart disease Paternal Uncle   . Heart disease Maternal Grandmother   . Stroke Maternal Grandfather   . Heart disease Paternal Grandfather     Review of Systems  Constitutional: + chills, fatigue, loss of appetite. Negative for fever,  diaphoresis, and unexpected weight change.  HENT: Negative for congestion, sore throat, rhinorrhea, sneezing, trouble swallowing and sinus pressure.  Eyes: Negative for photophobia and visual disturbance.  Respiratory: Negative for cough, chest tightness, shortness of breath, wheezing and stridor.  Cardiovascular: Negative for chest pain, palpitations and leg swelling.  Gastrointestinal: + nausea, vomiting, abdominal pain, diarrhea, abdominal distention but no blood in stool or anal bleeding.  Genitourinary: Negative for dysuria, hematuria, flank pain and difficulty urinating.  Musculoskeletal: Negative for myalgias, back pain, joint swelling, arthralgias and gait problem.  Skin: Negative for color change, pallor, rash and wound.  Neurological: Negative for dizziness, tremors, weakness and light-headedness.  Hematological: Negative for adenopathy. Does not bruise/bleed easily.  Psychiatric/Behavioral: Negative for behavioral problems, confusion, sleep disturbance, dysphoric  mood, decreased concentration and agitation.     OBJECTIVE: Temp:  [98 F (36.7 C)-99.6 F (37.6 C)] 98.9 F (37.2 C) (11/30 1116) Pulse Rate:  [55-106] 86 (11/30 1100) Resp:  [16-25] 24 (11/30 1100) BP: (105-142)/(54-90) 140/79 mmHg (11/30 1000) SpO2:  [89 %-100 %] 99 % (11/30 1100) Weight:  [153 lb 7 oz (69.6 kg)] 153 lb 7 oz (69.6 kg) (11/30 0335) Physical Exam  Constitutional:  oriented to person, place, and time. appears well-developed and well-nourished. No distress.  HENT: [place]/AT, PERRLA, no  scleral icterus, wearing oxygen Mouth/Throat: Oropharynx is clear and moist. No oropharyngeal exudate.  Cardiovascular: Normal rate, regular rhythm and normal heart sounds. Exam reveals no gallop and no friction rub.  No murmur heard.  Pulmonary/Chest: Effort normal and breath sounds normal. No respiratory distress.  has no wheezes.  Neck = supple, no nuchal rigidity Abdominal: Soft. Bowel sounds are normal.  exhibits no distension. There is no tenderness.  Lymphadenopathy: no cervical adenopathy. No axillary adenopathy GU = foley catheter in place Neurological: alert and oriented to person, place, and time.  Skin: Skin is warm and dry. + right AC echymosis Ext: + 1 edema, anasarca Psychiatric: a normal mood and affect.  behavior is normal.    LABS: Results for orders placed or performed during the hospital encounter of 01/29/15 (from the past 48 hour(s))  Basic metabolic panel     Status: Abnormal   Collection Time: 01/30/15  3:25 PM  Result Value Ref Range   Sodium 132 (L) 135 - 145 mmol/L   Potassium 3.1 (L) 3.5 - 5.1 mmol/L   Chloride 105 101 - 111 mmol/L   CO2 14 (L) 22 - 32 mmol/L   Glucose, Bld 94 65 - 99 mg/dL   BUN 43 (H) 6 - 20 mg/dL   Creatinine, Ser 3.73 (H) 0.44 - 1.00 mg/dL   Calcium 6.3 (LL) 8.9 - 10.3 mg/dL    Comment: CRITICAL RESULT CALLED TO, READ BACK BY AND VERIFIED WITH: RN SHARPE,K AT [id number] [phone] MARTINB    GFR calc non Af Amer 10 (L) >60 mL/min   GFR calc Af Amer 12 (L) >60 mL/min    Comment: (NOTE) The eGFR has been calculated using the CKD EPI equation. This calculation has not been validated in all clinical situations. eGFR's persistently <60 mL/min signify possible Chronic Kidney Disease.    Anion gap 13 5 - 15  Magnesium     Status: Abnormal   Collection Time: 01/30/15  3:25 PM  Result Value Ref Range   Magnesium 3.1 (H) 1.7 - 2.4 mg/dL  Lactic acid, plasma     Status: Abnormal   Collection Time: 01/30/15  3:25 PM  Result Value Ref Range    Lactic Acid, Venous 2.1 (HH) 0.5 - 2.0 mmol/L    Comment: CRITICAL RESULT CALLED TO, READ BACK BY AND VERIFIED WITH: RN [facility] AT [phone] MARTINB   Lactic acid, plasma     Status: None   Collection Time: 01/31/15  3:06 AM  Result Value Ref Range   Lactic Acid, Venous 2.0 0.5 - 2.0 mmol/L  Magnesium     Status: Abnormal   Collection Time: 01/31/15  3:06 AM  Result Value Ref Range   Magnesium 2.9 (H) 1.7 - 2.4 mg/dL  CBC with Differential/Platelet     Status: Abnormal   Collection Time: 01/31/15  3:06 AM  Result Value Ref Range   WBC 13.5 (H) 4.0 - 10.5 K/uL   RBC 4.77 3.87 -  5.11 MIL/uL   Hemoglobin 12.8 12.0 - 15.0 g/dL   HCT 37.5 36.0 - 46.0 %   MCV 78.6 78.0 - 100.0 fL   MCH 26.8 26.0 - 34.0 pg   MCHC 34.1 30.0 - 36.0 g/dL   RDW 16.0 (H) 11.5 - 15.5 %   Platelets 76 (L) 150 - 400 K/uL    Comment: CONSISTENT WITH PREVIOUS RESULT   Neutrophils Relative % 73 %   Lymphocytes Relative 10 %   Monocytes Relative 15 %   Eosinophils Relative 1 %   Basophils Relative 1 %   Neutro Abs 9.9 (H) 1.7 - 7.7 K/uL   Lymphs Abs 1.4 0.7 - 4.0 K/uL   Monocytes Absolute 2.0 (H) 0.1 - 1.0 K/uL   Eosinophils Absolute 0.1 0.0 - 0.7 K/uL   Basophils Absolute 0.1 0.0 - 0.1 K/uL   RBC Morphology BURR CELLS    WBC Morphology TOXIC GRANULATION     Comment: INCREASED BANDS (>20% BANDS)  Procalcitonin     Status: None   Collection Time: 01/31/15  3:06 AM  Result Value Ref Range   Procalcitonin 22.63 ng/mL    Comment:        Interpretation: PCT >= 10 ng/mL: Important systemic inflammatory response, almost exclusively due to severe bacterial sepsis or septic shock. (NOTE)         ICU PCT Algorithm               Non ICU PCT Algorithm    ----------------------------     ------------------------------         PCT < 0.25 ng/mL                 PCT < 0.1 ng/mL     Stopping of antibiotics            Stopping of antibiotics       strongly encouraged.               strongly encouraged.     ----------------------------     ------------------------------       PCT level decrease by               PCT < 0.25 ng/mL       >= 80% from peak PCT       OR PCT 0.25 - 0.5 ng/mL          Stopping of antibiotics                                             encouraged.     Stopping of antibiotics           encouraged.    ----------------------------     ------------------------------       PCT level decrease by              PCT >= 0.25 ng/mL       < 80% from peak PCT        AND PCT >= 0.5 ng/mL             Continuing antibiotics                                              encouraged.  Continuing antibiotics            encouraged.    ----------------------------     ------------------------------     PCT level increase compared          PCT > 0.5 ng/mL         with peak PCT AND          PCT >= 0.5 ng/mL             Escalation of antibiotics                                          strongly encouraged.      Escalation of antibiotics        strongly encouraged.   Renal function panel     Status: Abnormal   Collection Time: 01/31/15  3:06 AM  Result Value Ref Range   Sodium 134 (L) 135 - 145 mmol/L   Potassium 4.1 3.5 - 5.1 mmol/L    Comment: DELTA CHECK NOTED   Chloride 109 101 - 111 mmol/L   CO2 11 (L) 22 - 32 mmol/L   Glucose, Bld 105 (H) 65 - 99 mg/dL   BUN 46 (H) 6 - 20 mg/dL   Creatinine, Ser 3.57 (H) 0.44 - 1.00 mg/dL   Calcium 6.9 (L) 8.9 - 10.3 mg/dL   Phosphorus 2.3 (L) 2.5 - 4.6 mg/dL   Albumin 1.9 (L) 3.5 - 5.0 g/dL   GFR calc non Af Amer 11 (L) >60 mL/min   GFR calc Af Amer 13 (L) >60 mL/min    Comment: (NOTE) The eGFR has been calculated using the CKD EPI equation. This calculation has not been validated in all clinical situations. eGFR's persistently <60 mL/min signify possible Chronic Kidney Disease.    Anion gap 14 5 - 15  Magnesium     Status: Abnormal   Collection Time: 02/01/15  7:42 AM  Result Value Ref Range   Magnesium 2.6 (H) 1.7 - 2.4 mg/dL    CBC with Differential/Platelet     Status: Abnormal   Collection Time: 02/01/15  7:42 AM  Result Value Ref Range   WBC 15.1 (H) 4.0 - 10.5 K/uL    Comment: WHITE COUNT CONFIRMED ON SMEAR   RBC 4.44 3.87 - 5.11 MIL/uL   Hemoglobin 12.1 12.0 - 15.0 g/dL   HCT 33.7 (L) 36.0 - 46.0 %   MCV 75.9 (L) 78.0 - 100.0 fL   MCH 27.3 26.0 - 34.0 pg   MCHC 35.9 30.0 - 36.0 g/dL   RDW 16.6 (H) 11.5 - 15.5 %   Platelets 83 (L) 150 - 400 K/uL    Comment: PLATELET COUNT CONFIRMED BY SMEAR   Neutrophils Relative % 87 %   Lymphocytes Relative 4 %   Monocytes Relative 6 %   Eosinophils Relative 2 %   Basophils Relative 1 %   Neutro Abs 13.1 (H) 1.7 - 7.7 K/uL   Lymphs Abs 0.6 (L) 0.7 - 4.0 K/uL   Monocytes Absolute 0.9 0.1 - 1.0 K/uL   Eosinophils Absolute 0.3 0.0 - 0.7 K/uL   Basophils Absolute 0.2 (H) 0.0 - 0.1 K/uL   RBC Morphology BURR CELLS    WBC Morphology TOXIC GRANULATION     Comment: VACUOLATED NEUTROPHILS MILD LEFT SHIFT (1-5% METAS, OCC MYELO, OCC BANDS)   Procalcitonin     Status: None   Collection Time:  02/01/15  7:42 AM  Result Value Ref Range   Procalcitonin 8.97 ng/mL    Comment:        Interpretation: PCT > 2 ng/mL: Systemic infection (sepsis) is likely, unless other causes are known. (NOTE)         ICU PCT Algorithm               Non ICU PCT Algorithm    ----------------------------     ------------------------------         PCT < 0.25 ng/mL                 PCT < 0.1 ng/mL     Stopping of antibiotics            Stopping of antibiotics       strongly encouraged.               strongly encouraged.    ----------------------------     ------------------------------       PCT level decrease by               PCT < 0.25 ng/mL       >= 80% from peak PCT       OR PCT 0.25 - 0.5 ng/mL          Stopping of antibiotics                                             encouraged.     Stopping of antibiotics           encouraged.    ----------------------------      ------------------------------       PCT level decrease by              PCT >= 0.25 ng/mL       < 80% from peak PCT        AND PCT >= 0.5 ng/mL            Continuing antibiotics                                               encouraged.       Continuing antibiotics            encouraged.    ----------------------------     ------------------------------     PCT level increase compared          PCT > 0.5 ng/mL         with peak PCT AND          PCT >= 0.5 ng/mL             Escalation of antibiotics                                          strongly encouraged.      Escalation of antibiotics        strongly encouraged.   Renal function panel     Status: Abnormal   Collection Time: 02/01/15  7:42 AM  Result Value Ref Range   Sodium 135 135 - 145 mmol/L   Potassium 3.3 (L) 3.5 -  5.1 mmol/L   Chloride 110 101 - 111 mmol/L   CO2 15 (L) 22 - 32 mmol/L   Glucose, Bld 107 (H) 65 - 99 mg/dL   BUN 49 (H) 6 - 20 mg/dL   Creatinine, Ser 3.13 (H) 0.44 - 1.00 mg/dL   Calcium 7.5 (L) 8.9 - 10.3 mg/dL   Phosphorus 2.5 2.5 - 4.6 mg/dL   Albumin 1.9 (L) 3.5 - 5.0 g/dL   GFR calc non Af Amer 13 (L) >60 mL/min   GFR calc Af Amer 15 (L) >60 mL/min    Comment: (NOTE) The eGFR has been calculated using the CKD EPI equation. This calculation has not been validated in all clinical situations. eGFR's persistently <60 mL/min signify possible Chronic Kidney Disease.    Anion gap 10 5 - 15    MICRO: 11/27 blood cx aeromonas sobria (cefazolin S, cipro S, imi I, bactrim S)  cdiff is negative  IMAGING: Ct Abdomen Pelvis Wo Contrast  01/30/2015  CLINICAL DATA:  81 year old female with nausea vomiting and diarrhea for 6 days. Sepsis. Personal history of breast cancer. EXAM: CT ABDOMEN AND PELVIS WITHOUT CONTRAST TECHNIQUE: Multidetector CT imaging of the abdomen and pelvis was performed following the standard protocol without IV contrast. COMPARISON:  CT chest abdomen and pelvis 11/21/2010. FINDINGS:  Moderate scoliosis. There are some chronic appearing right costovertebral angle rib fractures which are new since 2012, but no acute No acute osseous abnormality identified. Small to moderate layering bilateral pleural effusions associated with bilateral lower lobe consolidation and/or atelectasis. No pericardial effusion. Postoperative changes to the right breast with multiple surgical clips noted. Foley catheter in place an the urinary bladder is decompressed. No pelvic free fluid. Negative noncontrast uterus and adnexa. Fluid in the distal colon. Diverticulosis with fluid in the left colon. Fluid and gas in the transverse and right colon. All of these segments are only mildly distended. The distal small bowel is decompressed. No dilated small bowel. There is moderate to severe distension of the stomach with gas and fluid. No enteric tube identified. The duodenum appears to be decompressed. No free air in the abdomen. No abdominal free fluid identified. Noncontrast liver, gallbladder, spleen, pancreas and adrenal glands appear stable and within normal limits. Aortoiliac calcified atherosclerosis noted. No acute renal findings are evident. No lymphadenopathy identified. There is some bilateral flank and abdominal wall stranding which could be related to anasarca, but is predominantly non dependent. IMPRESSION: 1. Bilateral pleural effusions with lower lobe consolidation and atelectasis. 2. Moderate to severe gas and fluid distension of the stomach consider NG tube decompression. No dilated small bowel. Fluid throughout the colon compatible with diarrhea. 3. Nonspecific body wall stranding, predominantly nondependent so consider cellulitis in addition to anasarca. Electronically Signed   By: Genevie Ann M.D.   On: 01/30/2015 15:09   Dg Chest Port 1 View  01/31/2015  CLINICAL DATA:  [facility] EXAM: PORTABLE CHEST 1 VIEW COMPARISON:  01/29/2015 FINDINGS: Hypoventilation.  Decreased lung volume with bibasilar  atelectasis. Increase in pulmonary vascular congestion and bilateral effusions. Findings consistent with heart failure. IMPRESSION: Worsening heart failure with edema and bilateral effusions Hypoventilation with bibasilar atelectasis. Electronically Signed   By: Franchot Gallo M.D.   On: 01/31/2015 07:28    Assessment/Plan:  81yo F with hx of breast cancer presented with sepsis due to GI illness found to have aeromonas sobria bacteremia. Aeromonas sepsis presenting with diarrheal infection is a common presentation. Patient appears to having good response to antibiotics and IVF resuscitation.  Sepsis due to aeromonas- continue with ceftaz. Recommend treatment for 10-14days. Can consider finishing course with oral regimen once she is ready for discharge. For now can d/c metronidazole  Leukocytosis = likely stress induced from sepsis anticipate that it should start trending down.  aki = appears improving with IVF. Continue with renal dosing of abtx  Dr Johnnye Sima to provide further recs tomorrow

## 2015-02-02 DIAGNOSIS — A4159 Other Gram-negative sepsis: Secondary | ICD-10-CM | POA: Diagnosis present

## 2015-02-02 DIAGNOSIS — R7881 Bacteremia: Secondary | ICD-10-CM | POA: Diagnosis present

## 2015-02-02 DIAGNOSIS — C50919 Malignant neoplasm of unspecified site of unspecified female breast: Secondary | ICD-10-CM

## 2015-02-02 DIAGNOSIS — E785 Hyperlipidemia, unspecified: Secondary | ICD-10-CM | POA: Diagnosis present

## 2015-02-02 DIAGNOSIS — I10 Essential (primary) hypertension: Secondary | ICD-10-CM | POA: Diagnosis present

## 2015-02-02 DIAGNOSIS — R197 Diarrhea, unspecified: Secondary | ICD-10-CM

## 2015-02-02 DIAGNOSIS — A414 Sepsis due to anaerobes: Secondary | ICD-10-CM | POA: Diagnosis present

## 2015-02-02 DIAGNOSIS — A419 Sepsis, unspecified organism: Secondary | ICD-10-CM

## 2015-02-02 DIAGNOSIS — B9689 Other specified bacterial agents as the cause of diseases classified elsewhere: Secondary | ICD-10-CM

## 2015-02-02 HISTORY — DX: Sepsis, unspecified organism: A41.9

## 2015-02-02 LAB — RENAL FUNCTION PANEL
ANION GAP: 8 (ref 5–15)
Albumin: 1.7 g/dL — ABNORMAL LOW (ref 3.5–5.0)
BUN: 46 mg/dL — ABNORMAL HIGH (ref 6–20)
CALCIUM: 8 mg/dL — AB (ref 8.9–10.3)
CHLORIDE: 108 mmol/L (ref 101–111)
CO2: 17 mmol/L — ABNORMAL LOW (ref 22–32)
CREATININE: 2.54 mg/dL — AB (ref 0.44–1.00)
GFR, EST AFRICAN AMERICAN: 19 mL/min — AB (ref >60)
GFR, EST NON AFRICAN AMERICAN: 17 mL/min — AB (ref >60)
Glucose, Bld: 111 mg/dL — ABNORMAL HIGH (ref 65–99)
Phosphorus: 2.9 mg/dL (ref 2.5–4.6)
Potassium: 2.9 mmol/L — ABNORMAL LOW (ref 3.5–5.1)
Sodium: 133 mmol/L — ABNORMAL LOW (ref 135–145)

## 2015-02-02 LAB — CBC WITH DIFFERENTIAL/PLATELET
BASOS ABS: 0.2 10*3/uL — AB (ref 0.0–0.1)
Basophils Relative: 1 %
Eosinophils Absolute: 0.3 10*3/uL (ref 0.0–0.7)
Eosinophils Relative: 2 %
HCT: 31.4 % — ABNORMAL LOW (ref 36.0–46.0)
Hemoglobin: 11.2 g/dL — ABNORMAL LOW (ref 12.0–15.0)
LYMPHS ABS: 1 10*3/uL (ref 0.7–4.0)
Lymphocytes Relative: 6 %
MCH: 27.3 pg (ref 26.0–34.0)
MCHC: 35.7 g/dL (ref 30.0–36.0)
MCV: 76.4 fL — ABNORMAL LOW (ref 78.0–100.0)
MONO ABS: 0.6 10*3/uL (ref 0.1–1.0)
MONOS PCT: 4 %
Neutro Abs: 13.8 10*3/uL — ABNORMAL HIGH (ref 1.7–7.7)
Neutrophils Relative %: 87 %
Platelets: 93 10*3/uL — ABNORMAL LOW (ref 150–400)
RBC: 4.11 MIL/uL (ref 3.87–5.11)
RDW: 16.9 % — AB (ref 11.5–15.5)
WBC: 15.9 10*3/uL — AB (ref 4.0–10.5)

## 2015-02-02 LAB — POTASSIUM: POTASSIUM: 3 mmol/L — AB (ref 3.5–5.1)

## 2015-02-02 LAB — MAGNESIUM
MAGNESIUM: 2.4 mg/dL (ref 1.7–2.4)
MAGNESIUM: 2.4 mg/dL (ref 1.7–2.4)

## 2015-02-02 MED ORDER — SODIUM CHLORIDE 0.9 % IV SOLN
INTRAVENOUS | Status: DC
  Administered 2015-02-04: 09:00:00 via INTRAVENOUS

## 2015-02-02 MED ORDER — POTASSIUM CHLORIDE CRYS ER 20 MEQ PO TBCR
40.0000 meq | EXTENDED_RELEASE_TABLET | Freq: Once | ORAL | Status: AC
  Administered 2015-02-02: 40 meq via ORAL
  Filled 2015-02-02: qty 2

## 2015-02-02 MED ORDER — METOPROLOL TARTRATE 12.5 MG HALF TABLET
12.5000 mg | ORAL_TABLET | Freq: Two times a day (BID) | ORAL | Status: DC
  Administered 2015-02-02 – 2015-02-03 (×2): 12.5 mg via ORAL
  Filled 2015-02-02 (×6): qty 1

## 2015-02-02 MED ORDER — HYDRALAZINE HCL 20 MG/ML IJ SOLN: 5.0000 mg | INTRAMUSCULAR | Status: DC | PRN

## 2015-02-02 MED ORDER — POTASSIUM CHLORIDE CRYS ER 20 MEQ PO TBCR
40.0000 meq | EXTENDED_RELEASE_TABLET | ORAL | Status: AC
  Administered 2015-02-02 (×2): 40 meq via ORAL
  Filled 2015-02-02 (×2): qty 2

## 2015-02-02 NOTE — Progress Notes (Signed)
[place] TEAM 1 - Stepdown/ICU TEAM Progress Note  Jenna [name] [id number] DOB: 05/27/1931 DOA: 01/29/2015 PCP: Vena Austria, MD  Admit HPI / Brief Narrative: 81 y.o.WF PMHx Anxiety HTN, HLD, Rt IDC Breast Ca in remission   Brought to the ER at [facility] because of patient having persistent diarrhea over the last 4 days. Patient states patient has been having multiple episodes of diarrhea to the point that patient became very weak and was unable to walk. Has been having some nausea denies any vomiting. Patient was unable to eat because of the nausea. Has been having cramping abdominal pain. Patient has not had any recent travel or sick contacts. Has had a TV dinner 6 days ago. In the ER patient was found to be hypotensive and was given 3 L fluid bolus. Lactic acid was elevated with creatinine markedly elevated from baseline with metabolic acidosis severe hypokalemia and hypomagnesemia. Patient has been admitted for sepsis from intra-abdominal source most likely gastroenteritis/colitis. On my exam patient's abdomen appears benign. Patient's blood pressure is still in the [id number] systolic. Patient is alert awake and following commands. Patient states her diarrhea was dark in color.   HPI/Subjective: 12/1 A/O 4, positive fatigue, positive SOB. States it home is extremely active in her community. States no need for any DME equipment. Drives a car. Appears at least 10-15 years younger than stated age  Assessment/Plan: Sepsis from intra-abdominal source most likely gastroenteritis/colitis  -Was started on Cipro and Flagyl empirically. -GI pathogen panel pending -fecal lactoferrin pending  Bacteremia positive AEROMONAS SOBRIA/ KLEBSIELLA PNEUMONIAE -Cipro and Flagyl stopped per ID -Patient started on Fortaz per ID  Acute renal failure with metabolic acidosis  - probably from hypotension dehydration, ARB use -. Resolving with hydration. Continue normal saline  52ml/hr  -Thrombocytopenia probably from infectious causes.  -Slowly trending up -Closely monitor for DIC.  Hypokalemia -Potassium 40 mEq -Repeat potassium at 1400 still low-K Dur 40 mEq 1  Hyperlipidemia  -  Hypertension; -Restart  Metoprolol 12.5 mg  - hydralazine IV 5 mg BID-SBP> 165 or DBP> 100   Code Status: FULL Family Communication: Daughter present at time of exam Disposition Plan: Home    Consultants: Dr.Jeffrey C Hatcher ID   Procedure/Significant Events:    Culture 11/27 blood left/right arm positive AEROMONAS SOBRIA/ KLEBSIELLA PNEUMONIAE 11/27 urine negative final 11/27 urine negative final 11/27 C diff Ag and toxin negative 12/1 blood pending   Antibiotics: Cipro 11/27-11/28  Flagy 11/27 >> 11/28 Ceftaz 11/28 >>  DVT prophylaxis: SCD   Devices    LINES / TUBES:      Continuous Infusions: . sodium chloride    . lactated ringers 10 mL/hr at 02/01/15 1345    Objective: VITAL SIGNS: Temp: 98.4 F (36.9 C) (12/01 2012) Temp Source: Oral (12/01 2012) SPO2; FIO2:   Intake/Output Summary (Last 24 hours) at 02/02/15 2022 Last data filed at 02/02/15 1300  Gross per 24 hour  Intake    440 ml  Output   1065 ml  Net   -625 ml     Exam: General: A/O 4, positive acute respiratory distress (requiring 2 L O2) Eyes: Negative headache, negative scleral hemorrhage ENT: Negative Runny nose, negative ear pain, negative gingival bleeding, Neck:  Negative scars, masses, torticollis, lymphadenopathy, JVD Lungs: Clear to auscultation bilaterally without wheezes or crackles Cardiovascular: Regular rate and rhythm without murmur gallop or rub normal S1 and S2 Abdomen:negative abdominal pain, nondistended, positive soft, bowel sounds, no rebound, no ascites, no  appreciable mass Extremities: No significant cyanosis, clubbing, or edema bilateral lower extremities Psychiatric:  Negative depression, negative anxiety, negative fatigue, negative  mania  Neurologic:  Cranial nerves II through XII intact, tongue/uvula midline, all extremities muscle strength 5/5, sensation intact throughout, negative dysarthria, negative expressive aphasia, negative receptive aphasia.   Data Reviewed: Basic Metabolic Panel:  Recent Labs Lab 01/30/15 0805 01/30/15 1525 01/31/15 0306 02/01/15 0742 02/02/15 0330 02/02/15 1414  NA 135 132* 134* 135 133*  --   K 3.0* 3.1* 4.1 3.3* 2.9* 3.0*  CL 107 105 109 110 108  --   CO2 15* 14* 11* 15* 17*  --   GLUCOSE 110* 94 105* 107* 111*  --   BUN 39* 43* 46* 49* 46*  --   CREATININE 3.80* 3.73* 3.57* 3.13* 2.54*  --   CALCIUM 6.2* 6.3* 6.9* 7.5* 8.0*  --   MG  --  3.1* 2.9* 2.6* 2.4 2.4  PHOS  --   --  2.3* 2.5 2.9  --    Liver Function Tests:  Recent Labs Lab 01/29/15 1445 01/30/15 0805 01/31/15 0306 02/01/15 0742 02/02/15 0330  AST 98* 76*  --   --   --   ALT 98* 79*  --   --   --   ALKPHOS 170* 120  --   --   --   BILITOT 2.1* 1.9*  --   --   --   PROT 6.2* 4.7*  --   --   --   ALBUMIN 2.7* 1.9* 1.9* 1.9* 1.7*   No results for input(s): LIPASE, AMYLASE in the last 168 hours. No results for input(s): AMMONIA in the last 168 hours. CBC:  Recent Labs Lab 01/29/15 1445 01/29/15 2303 01/30/15 0805 01/31/15 0306 02/01/15 0742 02/02/15 0330  WBC 6.1 6.8 7.2 13.5* 15.1* 15.9*  NEUTROABS 5.4  --  5.8 9.9* 13.1* 13.8*  HGB 13.0 13.1 11.6* 12.8 12.1 11.2*  HCT 37.2 37.6 33.0* 37.5 33.7* 31.4*  MCV 77.2* 78.7 78.2 78.6 75.9* 76.4*  PLT 89* 69* 78* 76* 83* 93*   Cardiac Enzymes:  Recent Labs Lab 01/29/15 1445 01/29/15 2019 01/30/15 0237 01/30/15 0805  TROPONINI 0.15* 0.13* 0.09* 0.11*   BNP (last 3 results) No results for input(s): BNP in the last 8760 hours.  ProBNP (last 3 results) No results for input(s): PROBNP in the last 8760 hours.  CBG:  Recent Labs Lab 01/30/15 0103 01/30/15 0216  GLUCAP 117* 92    Recent Results (from the past 240 hour(s))  Blood  Culture (routine x 2)     Status: None (Preliminary result)   Collection Time: 01/29/15  2:45 PM  Result Value Ref Range Status   Specimen Description BLOOD LEFT ARM  Final   Special Requests   Final    BOTTLES DRAWN AEROBIC AND ANAEROBIC 5CC BOTH BOTTLES   Culture  Setup Time   Final    GRAM NEGATIVE RODS IN BOTH AEROBIC AND ANAEROBIC BOTTLES CRITICAL RESULT CALLED TO, READ BACK BY AND VERIFIED WITH: L. FLOOD,RN AT 0730 ON [id number] BY Rhea Bleacher    Culture   Final    AEROMONAS SOBRIA GRAM NEGATIVE RODS Performed at [facility]    Report Status PENDING  Incomplete   Organism ID, Bacteria AEROMONAS SOBRIA  Final      Susceptibility   Aeromonas sobria - MIC*    CEFAZOLIN 8 SENSITIVE Sensitive     GENTAMICIN <=1 SENSITIVE Sensitive     CIPROFLOXACIN <=0.25 SENSITIVE  Sensitive     IMIPENEM 8 INTERMEDIATE Intermediate     TRIMETH/SULFA <=20 SENSITIVE Sensitive     * AEROMONAS SOBRIA  Blood Culture (routine x 2)     Status: None (Preliminary result)   Collection Time: 01/29/15  2:45 PM  Result Value Ref Range Status   Specimen Description BLOOD RIGHT ARM  Final   Special Requests   Final    BOTTLES DRAWN AEROBIC AND ANAEROBIC 5CC BOTH BOTTLES   Culture  Setup Time   Final    GRAM NEGATIVE RODS IN BOTH AEROBIC AND ANAEROBIC BOTTLES CRITICAL RESULT CALLED TO, READ BACK BY AND VERIFIED WITH: L. FLOOD,RN AT 0730 ON [id number] BY Rhea Bleacher    Culture   Final    AEROMONAS SOBRIA KLEBSIELLA PNEUMONIAE Performed at [facility]    Report Status PENDING  Incomplete  Urine culture     Status: None   Collection Time: 01/29/15  2:48 PM  Result Value Ref Range Status   Specimen Description URINE, CLEAN CATCH  Final   Special Requests NONE  Final   Culture   Final    NO GROWTH 1 DAY Performed at [facility]    Report Status 01/31/2015 FINAL  Final  MRSA PCR Screening     Status: None   Collection Time: 01/29/15  7:10 PM  Result Value Ref Range Status   MRSA  by PCR NEGATIVE NEGATIVE Final    Comment:        The GeneXpert MRSA Assay (FDA approved for NASAL specimens only), is one component of a comprehensive MRSA colonization surveillance program. It is not intended to diagnose MRSA infection nor to guide or monitor treatment for MRSA infections.   C difficile quick scan w PCR reflex     Status: None   Collection Time: 01/29/15 10:45 PM  Result Value Ref Range Status   C Diff antigen NEGATIVE NEGATIVE Final   C Diff toxin NEGATIVE NEGATIVE Final   C Diff interpretation Negative for toxigenic C. difficile  Final  Culture, Urine     Status: None   Collection Time: 01/30/15  8:52 AM  Result Value Ref Range Status   Specimen Description URINE, CATHETERIZED  Final   Special Requests NONE  Final   Culture NO GROWTH 1 DAY  Final   Report Status 01/31/2015 FINAL  Final     Studies:  Recent x-ray studies have been reviewed in detail by the Attending Physician  Scheduled Meds:  Scheduled Meds: . antiseptic oral rinse  7 mL Mouth Rinse BID  . cefTAZidime (FORTAZ)  IV  1 g Intravenous Q24H  . metoprolol tartrate  12.5 mg Oral BID  . potassium chloride  40 mEq Oral Once  . sertraline  50 mg Oral q morning - 10a  . sodium chloride  2,000 mL Intravenous Once    Time spent on care of this patient: 40 mins   Tonya Wantz, Geraldo Docker , MD  [facility] Office  [phone] Pager - [phone]  On-Call/Text Page:      [email or web address]      password [email or web address]  If 7PM-7AM, please contact night-coverage [email or web address] Password [email or web address] 02/02/2015, 8:22 PM   LOS: 4 days   Care during the described time interval was provided by me .  I have reviewed this patient's available data, including medical history, events of note, physical examination, and all test results as part of my evaluation. I have personally reviewed and interpreted all radiology studies.   Dia Crawford, MD  [phone] Pager

## 2015-02-02 NOTE — Progress Notes (Signed)
eLink Physician-Brief Progress Note Patient Name: Jenna [name] DOB: 07-04-1931 MRN: [id number]   Date of Service  02/02/2015  HPI/Events of Note  Hypokalemia  eICU Interventions  Potassium replaced     Intervention Category Intermediate Interventions: Electrolyte abnormality - evaluation and management  Nathanael Krist 02/02/2015, 5:01 AM

## 2015-02-02 NOTE — Care Management Note (Signed)
Case Management Note  Patient Details  Name: Jenna [name] MRN: [id number] Date of Birth: 09-30-1931  Subjective/Objective:        Lives at home alone.  Independent prior.  Daughter lives in [place] and both her and her husband work -days and would be gone 4 out of 3 days per week.  Plan is for Patient to go home with daughter. PT ordered.  Patient states very weak getting to chair yesterday, definitely states needs assistance.  Not opposed to ST rehab if needed.              Action/Plan:   Expected Discharge Date:                  Expected Discharge Plan:  [place]  In-House Referral:     Discharge planning Services  CM Consult  Post Acute Care Choice:    Choice offered to:     DME Arranged:    DME Agency:     HH Arranged:    [place] Agency:     Status of Service:  In process, will continue to follow  Medicare Important Message Given:    Date Medicare IM Given:    Medicare IM give by:    Date Additional Medicare IM Given:    Additional Medicare Important Message give by:     If discussed at [place] of Stay Meetings, dates discussed:    Additional Comments:  Vergie Living, RN 02/02/2015, 10:56 AM

## 2015-02-02 NOTE — Progress Notes (Signed)
Potassium 3.0 MD Sherral Hammers notified)

## 2015-02-02 NOTE — Progress Notes (Signed)
INFECTIOUS DISEASE PROGRESS NOTE  ID: Jenna [name] is a 81 y.o. female with  Principal Problem:   Sepsis ([place]) Active Problems:   ARF (acute renal failure) (HCC)   Thrombocytopenia (HCC)   Hypokalemia   Elevated LFTs   Sepsis with multiple organ dysfunction (MOD) (HCC)   Bacteremia   Acute respiratory failure with hypoxia (HCC)  Subjective: Up in bed eating  Denies CP or headache. Her diarrhea is better  Abtx:  Anti-infectives    Start     Dose/Rate Route Frequency Ordered Stop   01/30/15 1600  ciprofloxacin (CIPRO) IVPB 400 mg  Status:  Discontinued     400 mg 200 mL/hr over 60 Minutes Intravenous Every 24 hours 01/29/15 2019 01/30/15 0737   01/30/15 0815  cefTAZidime (FORTAZ) 1 g in dextrose 5 % 50 mL IVPB     1 g 100 mL/hr over 30 Minutes Intravenous Every 24 hours 01/30/15 0813     01/30/15 0745  metroNIDAZOLE (FLAGYL) IVPB 500 mg     500 mg 100 mL/hr over 60 Minutes Intravenous 3 times per day 01/30/15 0736     01/30/15 0200  metroNIDAZOLE (FLAGYL) IVPB 500 mg  Status:  Discontinued     500 mg 100 mL/hr over 60 Minutes Intravenous Every 8 hours 01/29/15 2014 01/30/15 0736   01/29/15 1615  metroNIDAZOLE (FLAGYL) IVPB 500 mg    Comments:  Diarrhea   500 mg 100 mL/hr over 60 Minutes Intravenous  Once 01/29/15 1613 01/29/15 1847   01/29/15 1615  ciprofloxacin (CIPRO) IVPB 400 mg    Comments:  Diarrhea   400 mg 200 mL/hr over 60 Minutes Intravenous  Once 01/29/15 1613 01/29/15 1751      Medications:  Scheduled: . antiseptic oral rinse  7 mL Mouth Rinse BID  . cefTAZidime (FORTAZ)  IV  1 g Intravenous Q24H  . metronidazole  500 mg Intravenous 3 times per day  . sertraline  50 mg Oral q morning - 10a  . sodium chloride  2,000 mL Intravenous Once    Objective: Vital signs in last 24 hours: Temp:  [98.1 F (36.7 C)-98.9 F (37.2 C)] 98.1 F (36.7 C) (12/01 0816) Pulse Rate:  [64-102] 83 (12/01 0600) Resp:  [17-24] 17 (12/01 0600) BP:  (101-172)/(58-106) 129/71 mmHg (12/01 0600) SpO2:  [95 %-99 %] 97 % (12/01 0600) Weight:  [70.6 kg (155 lb 10.3 oz)] 70.6 kg (155 lb 10.3 oz) (12/01 0200)   General appearance: alert, cooperative, fatigued and no distress Resp: clear to auscultation bilaterally Cardio: regular rate and rhythm GI: normal findings: bowel sounds normal and soft, non-tender Extremities: edema none  Lab Results  Recent Labs  02/01/15 0742 02/02/15 0330  WBC 15.1* 15.9*  HGB 12.1 11.2*  HCT 33.7* 31.4*  NA 135 133*  K 3.3* 2.9*  CL 110 108  CO2 15* 17*  BUN 49* 46*  CREATININE 3.13* 2.54*   Liver Panel  Recent Labs  02/01/15 0742 02/02/15 0330  ALBUMIN 1.9* 1.7*   Sedimentation Rate No results for input(s): ESRSEDRATE in the last 72 hours. C-Reactive Protein No results for input(s): CRP in the last 72 hours.  Microbiology: Recent Results (from the past 240 hour(s))  Blood Culture (routine x 2)     Status: None (Preliminary result)   Collection Time: 01/29/15  2:45 PM  Result Value Ref Range Status   Specimen Description BLOOD LEFT ARM  Final   Special Requests   Final    BOTTLES DRAWN AEROBIC  AND ANAEROBIC 5CC BOTH BOTTLES   Culture  Setup Time   Final    GRAM NEGATIVE RODS IN BOTH AEROBIC AND ANAEROBIC BOTTLES CRITICAL RESULT CALLED TO, READ BACK BY AND VERIFIED WITH: L. FLOOD,RN AT 0730 ON [id number] BY Jenna [name]    Culture   Final    AEROMONAS SOBRIA GRAM NEGATIVE RODS Performed at [facility]    Report Status PENDING  Incomplete   Organism ID, Bacteria AEROMONAS SOBRIA  Final      Susceptibility   Aeromonas sobria - MIC*    CEFAZOLIN 8 SENSITIVE Sensitive     GENTAMICIN <=1 SENSITIVE Sensitive     CIPROFLOXACIN <=0.25 SENSITIVE Sensitive     IMIPENEM 8 INTERMEDIATE Intermediate     TRIMETH/SULFA <=20 SENSITIVE Sensitive     * AEROMONAS SOBRIA  Blood Culture (routine x 2)     Status: None (Preliminary result)   Collection Time: 01/29/15  2:45 PM  Result Value  Ref Range Status   Specimen Description BLOOD RIGHT ARM  Final   Special Requests   Final    BOTTLES DRAWN AEROBIC AND ANAEROBIC 5CC BOTH BOTTLES   Culture  Setup Time   Final    GRAM NEGATIVE RODS IN BOTH AEROBIC AND ANAEROBIC BOTTLES CRITICAL RESULT CALLED TO, READ BACK BY AND VERIFIED WITH: L. FLOOD,RN AT 0730 ON [id number] BY Jenna [name]    Culture   Final    AEROMONAS SOBRIA Performed at [facility]    Report Status PENDING  Incomplete  Urine culture     Status: None   Collection Time: 01/29/15  2:48 PM  Result Value Ref Range Status   Specimen Description URINE, CLEAN CATCH  Final   Special Requests NONE  Final   Culture   Final    NO GROWTH 1 DAY Performed at [facility]    Report Status 01/31/2015 FINAL  Final  MRSA PCR Screening     Status: None   Collection Time: 01/29/15  7:10 PM  Result Value Ref Range Status   MRSA by PCR NEGATIVE NEGATIVE Final    Comment:        The GeneXpert MRSA Assay (FDA approved for NASAL specimens only), is one component of a comprehensive MRSA colonization surveillance program. It is not intended to diagnose MRSA infection nor to guide or monitor treatment for MRSA infections.   C difficile quick scan w PCR reflex     Status: None   Collection Time: 01/29/15 10:45 PM  Result Value Ref Range Status   C Diff antigen NEGATIVE NEGATIVE Final   C Diff toxin NEGATIVE NEGATIVE Final   C Diff interpretation Negative for toxigenic C. difficile  Final  Culture, Urine     Status: None   Collection Time: 01/30/15  8:52 AM  Result Value Ref Range Status   Specimen Description URINE, CATHETERIZED  Final   Special Requests NONE  Final   Culture NO GROWTH 1 DAY  Final   Report Status 01/31/2015 FINAL  Final    Studies/Results: No results found.   Assessment/Plan: Bacteremia (aeromonas 2/2, kleb 1/2) Diarrhea Sepsis Her WBC is slightly up, Cr trending down Will repeat her BCx Await ID/sensi of kleb Her CT does not  reveal a source Will stop flagyl  ARF She is now markedly hypertensive, will defer to primary to manage.   Breast Ca  Total days of antibiotics: 5/10 ceftaz            Jenna [name] Infectious  Diseases (pager) [phone] [email or web address] 02/02/2015, 9:14 AM  LOS: 4 days

## 2015-02-03 ENCOUNTER — Inpatient Hospital Stay (HOSPITAL_COMMUNITY): Payer: PPO

## 2015-02-03 DIAGNOSIS — R079 Chest pain, unspecified: Secondary | ICD-10-CM

## 2015-02-03 DIAGNOSIS — J810 Acute pulmonary edema: Secondary | ICD-10-CM | POA: Diagnosis present

## 2015-02-03 DIAGNOSIS — R7989 Other specified abnormal findings of blood chemistry: Secondary | ICD-10-CM

## 2015-02-03 DIAGNOSIS — J90 Pleural effusion, not elsewhere classified: Secondary | ICD-10-CM | POA: Diagnosis present

## 2015-02-03 DIAGNOSIS — E8770 Fluid overload, unspecified: Secondary | ICD-10-CM

## 2015-02-03 DIAGNOSIS — R778 Other specified abnormalities of plasma proteins: Secondary | ICD-10-CM | POA: Diagnosis present

## 2015-02-03 DIAGNOSIS — J948 Other specified pleural conditions: Secondary | ICD-10-CM

## 2015-02-03 LAB — BASIC METABOLIC PANEL
ANION GAP: 10 (ref 5–15)
Anion gap: 12 (ref 5–15)
BUN: 40 mg/dL — ABNORMAL HIGH (ref 6–20)
BUN: 40 mg/dL — ABNORMAL HIGH (ref 6–20)
CALCIUM: 7.9 mg/dL — AB (ref 8.9–10.3)
CO2: 17 mmol/L — ABNORMAL LOW (ref 22–32)
CO2: 15 mmol/L — ABNORMAL LOW (ref 22–32)
CREATININE: 1.85 mg/dL — AB (ref 0.44–1.00)
Calcium: 7.8 mg/dL — ABNORMAL LOW (ref 8.9–10.3)
Chloride: 105 mmol/L (ref 101–111)
Chloride: 103 mmol/L (ref 101–111)
Creatinine, Ser: 2.05 mg/dL — ABNORMAL HIGH (ref 0.44–1.00)
GFR calc non Af Amer: 24 mL/min — ABNORMAL LOW (ref >60)
GFR, EST AFRICAN AMERICAN: 25 mL/min — AB (ref >60)
GFR, EST AFRICAN AMERICAN: 28 mL/min — AB (ref >60)
GFR, EST NON AFRICAN AMERICAN: 22 mL/min — AB (ref >60)
Glucose, Bld: 117 mg/dL — ABNORMAL HIGH (ref 65–99)
Glucose, Bld: 162 mg/dL — ABNORMAL HIGH (ref 65–99)
POTASSIUM: 3 mmol/L — AB (ref 3.5–5.1)
Potassium: 3.7 mmol/L (ref 3.5–5.1)
SODIUM: 132 mmol/L — AB (ref 135–145)
SODIUM: 130 mmol/L — AB (ref 135–145)

## 2015-02-03 LAB — CBC WITH DIFFERENTIAL/PLATELET
BASOS ABS: 0 10*3/uL (ref 0.0–0.1)
Basophils Relative: 0 %
EOS ABS: 0.2 10*3/uL (ref 0.0–0.7)
Eosinophils Relative: 1 %
HCT: 31.5 % — ABNORMAL LOW (ref 36.0–46.0)
Hemoglobin: 11 g/dL — ABNORMAL LOW (ref 12.0–15.0)
LYMPHS ABS: 0.8 10*3/uL (ref 0.7–4.0)
Lymphocytes Relative: 5 %
MCH: 26.6 pg (ref 26.0–34.0)
MCHC: 34.9 g/dL (ref 30.0–36.0)
MCV: 76.3 fL — AB (ref 78.0–100.0)
MONO ABS: 0.5 10*3/uL (ref 0.1–1.0)
Monocytes Relative: 3 %
NEUTROS PCT: 91 %
Neutro Abs: 15.4 10*3/uL — ABNORMAL HIGH (ref 1.7–7.7)
PLATELETS: 93 10*3/uL — AB (ref 150–400)
RBC: 4.13 MIL/uL (ref 3.87–5.11)
RDW: 16.9 % — ABNORMAL HIGH (ref 11.5–15.5)
WBC: 16.9 10*3/uL — AB (ref 4.0–10.5)

## 2015-02-03 LAB — BLOOD GAS, ARTERIAL
Acid-base deficit: 10.6 mmol/L — ABNORMAL HIGH (ref 0.0–2.0)
BICARBONATE: 13.6 meq/L — AB (ref 20.0–24.0)
DRAWN BY: 295031
O2 CONTENT: 2 L/min
O2 SAT: 97.2 %
PH ART: 7.377 (ref 7.350–7.450)
Patient temperature: 98.6
TCO2: 14.3 mmol/L (ref 0–100)
pCO2 arterial: 23.7 mmHg — ABNORMAL LOW (ref 35.0–45.0)
pO2, Arterial: 97.7 mmHg (ref 80.0–100.0)

## 2015-02-03 LAB — CULTURE, BLOOD (ROUTINE X 2)

## 2015-02-03 LAB — GI PATHOGEN PANEL BY PCR, STOOL
C DIFFICILE TOXIN A/B: NOT DETECTED
Campylobacter by PCR: NOT DETECTED
Cryptosporidium by PCR: NOT DETECTED
E COLI (ETEC) LT/ST: NOT DETECTED
E coli (STEC): NOT DETECTED
E coli 0157 by PCR: NOT DETECTED
G lamblia by PCR: NOT DETECTED
Norovirus GI/GII: NOT DETECTED
Rotavirus A by PCR: NOT DETECTED
SHIGELLA BY PCR: NOT DETECTED
Salmonella by PCR: NOT DETECTED

## 2015-02-03 LAB — MAGNESIUM
MAGNESIUM: 2 mg/dL (ref 1.7–2.4)
MAGNESIUM: 1.9 mg/dL (ref 1.7–2.4)

## 2015-02-03 LAB — TROPONIN I
TROPONIN I: 2.28 ng/mL — AB (ref <0.031)
TROPONIN I: 1.85 ng/mL — AB (ref <0.031)

## 2015-02-03 MED ORDER — HYDRALAZINE HCL 20 MG/ML IJ SOLN: 10.0000 mg | INTRAMUSCULAR | Status: DC | PRN

## 2015-02-03 MED ORDER — NITROGLYCERIN 0.4 MG SL SUBL
SUBLINGUAL_TABLET | SUBLINGUAL | Status: AC
Start: 2015-02-03 — End: 2015-02-03
  Administered 2015-02-03: 16:00:00
  Filled 2015-02-03: qty 1

## 2015-02-03 MED ORDER — METOPROLOL TARTRATE 1 MG/ML IV SOLN
7.5000 mg | Freq: Once | INTRAVENOUS | Status: AC
  Administered 2015-02-03: 7.5 mg via INTRAVENOUS

## 2015-02-03 MED ORDER — MORPHINE SULFATE (PF) 4 MG/ML IV SOLN
INTRAVENOUS | Status: AC
  Administered 2015-02-03: 2 mg
  Filled 2015-02-03: qty 1

## 2015-02-03 MED ORDER — METOPROLOL TARTRATE 1 MG/ML IV SOLN
INTRAVENOUS | Status: AC
  Administered 2015-02-03: 7.5 mg
  Filled 2015-02-03: qty 10

## 2015-02-03 MED ORDER — METOPROLOL TARTRATE 25 MG PO TABS
25.0000 mg | ORAL_TABLET | Freq: Two times a day (BID) | ORAL | Status: DC
  Administered 2015-02-03 – 2015-02-05 (×4): 25 mg via ORAL
  Filled 2015-02-03 (×4): qty 1

## 2015-02-03 MED ORDER — FUROSEMIDE 10 MG/ML IJ SOLN
40.0000 mg | Freq: Once | INTRAMUSCULAR | Status: AC
  Administered 2015-02-03: 40 mg via INTRAVENOUS
  Filled 2015-02-03 (×2): qty 4

## 2015-02-03 MED ORDER — LORAZEPAM 1 MG PO TABS
1.0000 mg | ORAL_TABLET | Freq: Two times a day (BID) | ORAL | Status: DC | PRN
  Administered 2015-02-03 – 2015-02-09 (×10): 1 mg via ORAL
  Filled 2015-02-03 (×10): qty 1

## 2015-02-03 MED ORDER — POTASSIUM CHLORIDE CRYS ER 20 MEQ PO TBCR
60.0000 meq | EXTENDED_RELEASE_TABLET | Freq: Once | ORAL | Status: AC
  Administered 2015-02-03: 60 meq via ORAL
  Filled 2015-02-03: qty 3

## 2015-02-03 MED ORDER — LORAZEPAM 2 MG/ML IJ SOLN: 1.0000 mg | Freq: Every day | INTRAMUSCULAR | Status: DC

## 2015-02-03 MED ORDER — MORPHINE SULFATE (PF) 2 MG/ML IV SOLN: 2.0000 mg | Freq: Once | INTRAVENOUS | Status: AC

## 2015-02-03 NOTE — Progress Notes (Signed)
Patient complained of abdomen pain and left shoulder pain 8/10. ekg obtained. MD on floor, labs obtained. Nitroglycerin given x 2 and morphine given. Pain down to 5/10. Patient still complaining of shortness of breath. Oxygen 97% on 2 liters oxygen. ABG ordered. Patient being transferred to stepdown. Report given to receiving nurse.

## 2015-02-03 NOTE — Progress Notes (Signed)
Dr. Sherral Hammers in unit and immediately notified of critical value of 2.28 for Troponin.  Order received to administer 7.5 mg of metoprolol IV.  Order completed at 05:15.

## 2015-02-03 NOTE — Progress Notes (Signed)
IV team consult entered for difficult IV start.

## 2015-02-03 NOTE — Progress Notes (Signed)
ANTIBIOTIC CONSULT NOTE - FOLLOW UP  Pharmacy Consult for Ceftazidime Indication: r/o intra-abdominal infection and GNR bacteremia   Allergies  Allergen Reactions  . Iodine Itching and Rash    Iodine IV  . Sulfa Antibiotics Itching and Rash   Patient Measurements: Height: 4\' 11"  (149.9 cm) Weight: 155 lb 10.3 oz (70.6 kg) IBW/kg (Calculated) : 43.2 Vital Signs: Temp: 98 F (36.7 C) (12/02 1119) Temp Source: Oral (12/02 1119) BP: 160/92 mmHg (12/02 1100) Pulse Rate: 99 (12/02 1100) Intake/Output from previous day: 12/01 0701 - 12/02 0700 In: 1157.5 [P.O.:390; I.V.:767.5] Out: 550 [Urine:550] Intake/Output from this shift: Total I/O In: 230 [I.V.:180; IV Piggyback:50] Out: 600 [Urine:600] Labs:  Recent Labs  02/01/15 0742 02/02/15 0330 02/03/15 0232  WBC 15.1* 15.9* 16.9*  HGB 12.1 11.2* 11.0*  PLT 83* 93* 93*  CREATININE 3.13* 2.54* 2.05*   Estimated Creatinine Clearance: 18.4 mL/min (by C-G formula based on Cr of 2.05). No results for input(s): VANCOTROUGH, VANCOPEAK, VANCORANDOM, GENTTROUGH, GENTPEAK, GENTRANDOM, TOBRATROUGH, TOBRAPEAK, TOBRARND, AMIKACINPEAK, AMIKACINTROU, AMIKACIN in the last 72 hours.   Assessment: 81 year old female known to pharmacy for cipro + flagyl dosing in r/o intra-abdominal infection to change cipro to ceftazidime this AM with new GNR in blood cultures.   Cipro 11/27 >> 11/28 Fortaz 11/28 >> Flagyl 11/27 >> 12/01  11/27 Urine>> NGTD 11/27 Blood >> 4/4 aeromonas sobra (pan sensitive except intermediate to imipenem); klebsiella also growing, sensitivities pending 11/27 Cdiff ag + toxin - negative 11/27 GI pathogen >> in process  Goal of Therapy:  clinical resolution of infection  Plan:  Continue Ceftazidime 1g IV every 24 hours (for 10 days total) Monitor renal function, clinical status, and culture results.   Levester Fresh, PharmD, BCPS, [facility] Clinical Pharmacist Pager [phone] 02/03/2015 1:11 PM

## 2015-02-03 NOTE — Evaluation (Addendum)
Physical Therapy Evaluation Patient Details Name: Jenna [name] MRN: [id number] DOB: 15-Mar-1931 Today's Date: 02/03/2015   History of Present Illness  Patient is a 81 y/o female presents with diarrhea, malaise, weakness, renal failure, metabolic acidosis, hypotension and thrombocytopenia. Her blood culture grew out GNR, later identified as areomonas. cxr showing bilateral effusion and pulmonary vascular congestion. PMH of breast ca, osteoporosis, anxiety.   Clinical Impression  Patient presents with dyspnea on exertion, impaired endurance, weakness and balance deficits s/p above impacting mobility. Tolerated ambulation with Mod A for balance. Will try RW next time for support. Encouraged there ex and OOB as much as tolerated. Pt from home alone and independent and active PTA. Pt highly motivated to return to PLOF and would benefit from [facility] to maximize independence and mobility prior to return home. Will follow acutely.     Follow Up Recommendations SNF;Supervision/Assistance - 24 hour (Pt has a preference on where)    Equipment Recommendations  Other (comment) (TBD.)    Recommendations for Other Services OT consult     Precautions / Restrictions Precautions Precautions: Fall Restrictions Weight Bearing Restrictions: No      Mobility  Bed Mobility               General bed mobility comments: Sitting in chair upon PT arrival.   Transfers Overall transfer level: Needs assistance Equipment used: None Transfers: Sit to/from Stand Sit to Stand: Min assist         General transfer comment: Min A to boost from low recliner, handheld assist for steadying balance. + dizziness.  Ambulation/Gait Ambulation/Gait assistance: Mod assist Ambulation Distance (Feet): 32 Feet Assistive device: 1 person hand held assist Gait Pattern/deviations: Step-through pattern;Decreased stride length;Narrow base of support;Shuffle   Gait velocity interpretation: <1.8 ft/sec, indicative  of risk for recurrent falls General Gait Details: Slow, unsteady gait with multiple short standing rest breaks due to SOB. Sp02 remained >92% on 1L/min 02. DOE.   Stairs            Wheelchair Mobility    Modified Rankin (Stroke Patients Only)       Balance Overall balance assessment: Needs assistance Sitting-balance support: Feet supported;No upper extremity supported Sitting balance-Leahy Scale: Fair     Standing balance support: During functional activity Standing balance-Leahy Scale: Poor Standing balance comment: Requires UE support for balance- Min-Mod A during dynamic standing.                             Pertinent Vitals/Pain Pain Assessment: No/denies pain    Home Living Family/patient expects to be discharged to:: Private residence Living Arrangements: Alone Available Help at Discharge: Family;Available PRN/intermittently Type of Home: House Home Access: Level entry     Home Layout: One level Home Equipment: None      Prior Function Level of Independence: Independent         Comments: Very independent. Does cooking, cleaning, driving.     Hand Dominance        Extremity/Trunk Assessment   Upper Extremity Assessment: Defer to OT evaluation           Lower Extremity Assessment: Generalized weakness         Communication   Communication: No difficulties  Cognition Arousal/Alertness: Awake/alert Behavior During Therapy: WFL for tasks assessed/performed Overall Cognitive Status: Within Functional Limits for tasks assessed  General Comments General comments (skin integrity, edema, etc.): DIscussed session with family, disposition, expectations for mobility - exercise, using BSC etc.     Exercises General Exercises - Lower Extremity Ankle Circles/Pumps: Both;10 reps;Supine      Assessment/Plan    PT Assessment Patient needs continued PT services  PT Diagnosis Difficulty walking   PT  Problem List Decreased strength;Cardiopulmonary status limiting activity;Decreased activity tolerance;Decreased balance;Decreased mobility  PT Treatment Interventions Balance training;Gait training;Functional mobility training;Therapeutic activities;Therapeutic exercise;Patient/family education;Stair training   PT Goals (Current goals can be found in the Care Plan section) Acute Rehab PT Goals Patient Stated Goal: to return to independence PT Goal Formulation: With patient Time For Goal Achievement: 02/17/15 Potential to Achieve Goals: Good    Frequency Min 3X/week   Barriers to discharge Decreased caregiver support Pt lives alone    Co-evaluation               End of Session Equipment Utilized During Treatment: Gait belt Activity Tolerance: Patient tolerated treatment well;Patient limited by fatigue Patient left: in chair;with call bell/phone within reach;with family/visitor present Nurse Communication: Mobility status         Time: 1151-1221 PT Time Calculation (min) (ACUTE ONLY): 30 min   Charges:   PT Evaluation $Initial PT Evaluation Tier I: 1 Procedure PT Treatments $Gait Training: 8-22 mins   PT G Codes:        Diamon Reddinger A Eivan Gallina 02/03/2015, 1:36 PM Wray Kearns, [place], DPT [phone]

## 2015-02-03 NOTE — Progress Notes (Signed)
INFECTIOUS DISEASE PROGRESS NOTE  ID: Jenna [name] is a 81 y.o. female with  Principal Problem:   Sepsis ([place]) Active Problems:   ARF (acute renal failure) (HCC)   Thrombocytopenia (HCC)   Hypokalemia   Elevated LFTs   Sepsis with multiple organ dysfunction (MOD) (HCC)   Bacteremia   Acute respiratory failure with hypoxia (HCC)   Sepsis due to Klebsiella ([place])   Bacteremia due to Klebsiella pneumoniae   HLD (hyperlipidemia)   Essential hypertension  Subjective: Did not sleep, sob last pm C/o UE edema Having 2-3 loose BM/day  Abtx:  Anti-infectives    Start     Dose/Rate Route Frequency Ordered Stop   01/30/15 1600  ciprofloxacin (CIPRO) IVPB 400 mg  Status:  Discontinued     400 mg 200 mL/hr over 60 Minutes Intravenous Every 24 hours 01/29/15 2019 01/30/15 0737   01/30/15 0815  cefTAZidime (FORTAZ) 1 g in dextrose 5 % 50 mL IVPB     1 g 100 mL/hr over 30 Minutes Intravenous Every 24 hours 01/30/15 0813     01/30/15 0745  metroNIDAZOLE (FLAGYL) IVPB 500 mg  Status:  Discontinued     500 mg 100 mL/hr over 60 Minutes Intravenous 3 times per day 01/30/15 0736 02/02/15 0930   01/30/15 0200  metroNIDAZOLE (FLAGYL) IVPB 500 mg  Status:  Discontinued     500 mg 100 mL/hr over 60 Minutes Intravenous Every 8 hours 01/29/15 2014 01/30/15 0736   01/29/15 1615  metroNIDAZOLE (FLAGYL) IVPB 500 mg    Comments:  Diarrhea   500 mg 100 mL/hr over 60 Minutes Intravenous  Once 01/29/15 1613 01/29/15 1847   01/29/15 1615  ciprofloxacin (CIPRO) IVPB 400 mg    Comments:  Diarrhea   400 mg 200 mL/hr over 60 Minutes Intravenous  Once 01/29/15 1613 01/29/15 1751      Medications:  Scheduled: . antiseptic oral rinse  7 mL Mouth Rinse BID  . cefTAZidime (FORTAZ)  IV  1 g Intravenous Q24H  . metoprolol tartrate  12.5 mg Oral BID  . potassium chloride  60 mEq Oral Once  . sertraline  50 mg Oral q morning - 10a  . sodium chloride  2,000 mL Intravenous Once    Objective: Vital  signs in last 24 hours: Temp:  [98 F (36.7 C)-98.6 F (37 C)] 98 F (36.7 C) (12/02 0700) Pulse Rate:  [78-101] 92 (12/02 0800) Resp:  [17-23] 20 (12/02 0800) BP: (101-178)/(56-89) 158/84 mmHg (12/02 0800) SpO2:  [96 %-99 %] 97 % (12/02 0800)   General appearance: alert, cooperative and pale Resp: diminished breath sounds bibasilar and rales bilaterally Cardio: tachycardia GI: normal findings: bowel sounds normal and soft, non-tender  Lab Results  Recent Labs  02/02/15 0330 02/02/15 1414 02/03/15 0232  WBC 15.9*  --  16.9*  HGB 11.2*  --  11.0*  HCT 31.4*  --  31.5*  NA 133*  --  132*  K 2.9* 3.0* 3.0*  CL 108  --  105  CO2 17*  --  17*  BUN 46*  --  40*  CREATININE 2.54*  --  2.05*   Liver Panel  Recent Labs  02/01/15 0742 02/02/15 0330  ALBUMIN 1.9* 1.7*   Sedimentation Rate No results for input(s): ESRSEDRATE in the last 72 hours. C-Reactive Protein No results for input(s): CRP in the last 72 hours.  Microbiology: Recent Results (from the past 240 hour(s))  Blood Culture (routine x 2)     Status:  None (Preliminary result)   Collection Time: 01/29/15  2:45 PM  Result Value Ref Range Status   Specimen Description BLOOD LEFT ARM  Final   Special Requests   Final    BOTTLES DRAWN AEROBIC AND ANAEROBIC 5CC BOTH BOTTLES   Culture  Setup Time   Final    GRAM NEGATIVE RODS IN BOTH AEROBIC AND ANAEROBIC BOTTLES CRITICAL RESULT CALLED TO, READ BACK BY AND VERIFIED WITH: L. FLOOD,RN AT 0730 ON [id number] BY Rhea Bleacher    Culture   Final    AEROMONAS SOBRIA GRAM NEGATIVE RODS Performed at [facility]    Report Status PENDING  Incomplete   Organism ID, Bacteria AEROMONAS SOBRIA  Final      Susceptibility   Aeromonas sobria - MIC*    CEFAZOLIN 8 SENSITIVE Sensitive     GENTAMICIN <=1 SENSITIVE Sensitive     CIPROFLOXACIN <=0.25 SENSITIVE Sensitive     IMIPENEM 8 INTERMEDIATE Intermediate     TRIMETH/SULFA <=20 SENSITIVE Sensitive     * AEROMONAS  SOBRIA  Blood Culture (routine x 2)     Status: None (Preliminary result)   Collection Time: 01/29/15  2:45 PM  Result Value Ref Range Status   Specimen Description BLOOD RIGHT ARM  Final   Special Requests   Final    BOTTLES DRAWN AEROBIC AND ANAEROBIC 5CC BOTH BOTTLES   Culture  Setup Time   Final    GRAM NEGATIVE RODS IN BOTH AEROBIC AND ANAEROBIC BOTTLES CRITICAL RESULT CALLED TO, READ BACK BY AND VERIFIED WITH: L. FLOOD,RN AT 0730 ON [id number] BY Rhea Bleacher    Culture   Final    AEROMONAS SOBRIA KLEBSIELLA PNEUMONIAE Performed at [facility]    Report Status PENDING  Incomplete  Urine culture     Status: None   Collection Time: 01/29/15  2:48 PM  Result Value Ref Range Status   Specimen Description URINE, CLEAN CATCH  Final   Special Requests NONE  Final   Culture   Final    NO GROWTH 1 DAY Performed at [facility]    Report Status 01/31/2015 FINAL  Final  MRSA PCR Screening     Status: None   Collection Time: 01/29/15  7:10 PM  Result Value Ref Range Status   MRSA by PCR NEGATIVE NEGATIVE Final    Comment:        The GeneXpert MRSA Assay (FDA approved for NASAL specimens only), is one component of a comprehensive MRSA colonization surveillance program. It is not intended to diagnose MRSA infection nor to guide or monitor treatment for MRSA infections.   C difficile quick scan w PCR reflex     Status: None   Collection Time: 01/29/15 10:45 PM  Result Value Ref Range Status   C Diff antigen NEGATIVE NEGATIVE Final   C Diff toxin NEGATIVE NEGATIVE Final   C Diff interpretation Negative for toxigenic C. difficile  Final  Culture, Urine     Status: None   Collection Time: 01/30/15  8:52 AM  Result Value Ref Range Status   Specimen Description URINE, CATHETERIZED  Final   Special Requests NONE  Final   Culture NO GROWTH 1 DAY  Final   Report Status 01/31/2015 FINAL  Final    Studies/Results: No results found.   Assessment/Plan: Bacteremia  (aeromonas 2/2, kleb 1/2) Diarrhea Sepsis Her WBC is slightly up, Cr trending down Please f/u her repeat her BCx kleb is resistant to Amp only.  Her CT  does not reveal a source  Fluid overload CXR, consider lasix Decrease IVF She is +10L  ARF Improving  Hypertension She is now markedly hypertensive, will defer to primary to manage.   Breast Ca She does not have a port, never received CTX.   discussed with pt, family Available as needed  Total days of antibiotics: 6/10 [place] [facility] (pager) [phone] [email or web address] 02/03/2015, 9:45 AM  LOS: 5 days

## 2015-02-03 NOTE — Progress Notes (Signed)
[facility] TEAM 1 - Stepdown/ICU TEAM Progress Note  Jenna [name] [id number] DOB: [birth year] DOA: 01/29/2015 PCP: Vena Austria, MD  Admit HPI / Brief Narrative: 81 y.o.WF PMHx Anxiety HTN, HLD, Rt IDC Breast Ca in remission   Brought to [facility] ER [facility] because of patient having persistent diarrhea over Jenna last 4 days. Patient states patient has been having multiple episodes of diarrhea to Jenna point that patient became very weak and was unable to walk. Has been having some nausea denies any vomiting. Patient was unable to eat because of Jenna nausea. Has been having cramping abdominal pain. Patient has not had any recent travel or sick contacts. Has had a TV dinner 6 days ago. In [facility] ER patient was found to be hypotensive and was given 3 L fluid bolus. Lactic acid was elevated with creatinine markedly elevated from baseline with metabolic acidosis severe hypokalemia and hypomagnesemia. Patient has been admitted for sepsis from intra-abdominal source most likely gastroenteritis/colitis. On my exam patient's abdomen appears benign. Patient's blood pressure is still in Jenna [id number] systolic. Patient is alert awake and following commands. Patient states her diarrhea was dark in color.   HPI/Subjective: 12/2 A/O 4, positive pain starting on Lt side of Lt Breast w/ radiation to Lt neck/shoulder, positive Increased SOB, describes as sharp, rated 10/10, positive  SOB. States just started ~ 5 min ago.     Assessment/Plan: Sepsis from intra-abdominal source most likely gastroenteritis/colitis  -Was started on Cipro and Flagyl empirically. -GI pathogen panel pending -fecal lactoferrin pending  Bacteremia positive AEROMONAS SOBRIA/ KLEBSIELLA PNEUMONIAE -Cipro and Flagyl stopped per ID -Patient started on Fortaz per ID  Acute renal failure with metabolic acidosis  -probably from hypotension dehydration, ARB use -Resolving with hydration. Continue normal saline KVO  Acute  Pulmonary edema/right pleural effusion -Lasix 40 mg 1 -Strict I&O -Daily weight -Normal saline to KVO; DC'd lactated Ringer's  Chest Pain/Elevated troponin -12/2 EKG compared to 11/27 EKG; RBBB resolved, V4-V6 9 essentially normal T-wave -Trend troponin; initial troponin 2.28; most likely demand ischemia -Left neck/arm pain resolved, some residual lateral walls/LUQ abdominal pain.  HTN -Hydralazine 10 mg q 4hr PRN SBP> 165 or DBP> 100 -Metoprolol 25 mg BID -Hold ACEI or ARB secondary to renal failure  Abdominal Pain -KUB  - Thrombocytopenia probably from infectious causes.  -Slowly trending up -Closely monitor for DIC.  Hypokalemia -Potassium 40 mEq -K Dur 60 meq  Hyperlipidemia  -     Code Status: FULL Family Communication: Daughter and Son present at time of exam Disposition Plan: Home    Consultants: Dr.Jeffrey C Hatcher ID   Procedure/Significant Events: 11/29 echocardiogram;- LVEF= 65% to 70%. -(grade 1 diastolic dysfunction).   Culture 11/27 blood left/right arm positive AEROMONAS SOBRIA/ KLEBSIELLA PNEUMONIAE 11/27 urine negative final 11/27 urine negative final 11/27 C diff Ag and toxin negative 12/1 blood pending   Antibiotics: Cipro 11/27-11/28  Flagy 11/27 >> 11/28 Ceftaz 11/28 >>  DVT prophylaxis: SCD   Devices    LINES / TUBES:      Continuous Infusions: . sodium chloride 75 mL/hr at 02/02/15 2102    Objective: VITAL SIGNS: Temp: 98.1 F (36.7 C) (12/02 1929) Temp Source: Axillary (12/02 1929) BP: 158/103 mmHg (12/02 1929) Pulse Rate: 112 (12/02 1929) SPO2; FIO2:   Intake/Output Summary (Last 24 hours) at 02/03/15 2057 Last data filed at 02/03/15 1940  Gross per 24 hour  Intake 1497.5 ml  Output   3075 ml  Net -1577.5 ml  Exam: General: A/O 4, positive acute respiratory distress (requiring 2 L O2), positive left shoulder/arm pain Eyes: Negative headache, negative scleral hemorrhage ENT: Negative Runny  nose, negative ear pain, negative gingival bleeding, Neck:  Negative scars, masses, torticollis, lymphadenopathy, JVD Lungs: Clear to auscultation bilaterally without wheezes or crackles Cardiovascular: Tachycardic, Regular rhythm without murmur gallop or rub normal S1 and S2 Abdomen: Positive abdominal pain LUQ to palpation, nondistended, positive soft, bowel sounds, no rebound, no ascites, no appreciable mass Extremities: No significant cyanosis, clubbing, or edema bilateral lower extremities Psychiatric:  Negative depression, negative anxiety, negative fatigue, negative mania  Neurologic:  Cranial nerves II through XII intact, tongue/uvula midline, all extremities muscle strength 5/5, sensation intact throughout, negative dysarthria, negative expressive aphasia, negative receptive aphasia.   Data Reviewed: Basic Metabolic Panel:  Recent Labs Lab 01/31/15 0306 02/01/15 0742 02/02/15 0330 02/02/15 1414 02/03/15 0232 02/03/15 1608  NA 134* 135 133*  --  132* 130*  K 4.1 3.3* 2.9* 3.0* 3.0* 3.7  CL 109 110 108  --  105 103  CO2 11* 15* 17*  --  17* 15*  GLUCOSE 105* 107* 111*  --  117* 162*  BUN 46* 49* 46*  --  40* 40*  CREATININE 3.57* 3.13* 2.54*  --  2.05* 1.85*  CALCIUM 6.9* 7.5* 8.0*  --  7.8* 7.9*  MG 2.9* 2.6* 2.4 2.4 2.0 1.9  PHOS 2.3* 2.5 2.9  --   --   --    Liver Function Tests:  Recent Labs Lab 01/29/15 1445 01/30/15 0805 01/31/15 0306 02/01/15 0742 02/02/15 0330  AST 98* 76*  --   --   --   ALT 98* 79*  --   --   --   ALKPHOS 170* 120  --   --   --   BILITOT 2.1* 1.9*  --   --   --   PROT 6.2* 4.7*  --   --   --   ALBUMIN 2.7* 1.9* 1.9* 1.9* 1.7*   No results for input(s): LIPASE, AMYLASE in Jenna last 168 hours. No results for input(s): AMMONIA in Jenna last 168 hours. CBC:  Recent Labs Lab 01/30/15 0805 01/31/15 0306 02/01/15 0742 02/02/15 0330 02/03/15 0232  WBC 7.2 13.5* 15.1* 15.9* 16.9*  NEUTROABS 5.8 9.9* 13.1* 13.8* 15.4*  HGB 11.6* 12.8  12.1 11.2* 11.0*  HCT 33.0* 37.5 33.7* 31.4* 31.5*  MCV 78.2 78.6 75.9* 76.4* 76.3*  PLT 78* 76* 83* 93* 93*   Cardiac Enzymes:  Recent Labs Lab 01/29/15 1445 01/29/15 2019 01/30/15 0237 01/30/15 0805 02/03/15 1608  TROPONINI 0.15* 0.13* 0.09* 0.11* 2.28*   BNP (last 3 results) No results for input(s): BNP in Jenna last 8760 hours.  ProBNP (last 3 results) No results for input(s): PROBNP in Jenna last 8760 hours.  CBG:  Recent Labs Lab 01/30/15 0103 01/30/15 0216  GLUCAP 117* 92    Recent Results (from Jenna past 240 hour(s))  Blood Culture (routine x 2)     Status: None   Collection Time: 01/29/15  2:45 PM  Result Value Ref Range Status   Specimen Description BLOOD LEFT ARM  Final   Special Requests   Final    BOTTLES DRAWN AEROBIC AND ANAEROBIC 5CC BOTH BOTTLES   Culture  Setup Time   Final    GRAM NEGATIVE RODS IN BOTH AEROBIC AND ANAEROBIC BOTTLES CRITICAL RESULT CALLED TO, READ BACK BY AND VERIFIED WITH: L. FLOOD,RN AT 0730 ON [id number] BY Rhea Bleacher  Culture   Final    AEROMONAS SOBRIA KLEBSIELLA PNEUMONIAE Performed at [facility]    Report Status 02/03/2015 FINAL  Final   Organism ID, Bacteria AEROMONAS SOBRIA  Final   Organism ID, Bacteria KLEBSIELLA PNEUMONIAE  Final      Susceptibility   Aeromonas sobria - MIC*    CEFAZOLIN 8 SENSITIVE Sensitive     GENTAMICIN <=1 SENSITIVE Sensitive     CIPROFLOXACIN <=0.25 SENSITIVE Sensitive     IMIPENEM 8 INTERMEDIATE Intermediate     TRIMETH/SULFA <=20 SENSITIVE Sensitive     * AEROMONAS SOBRIA   Klebsiella pneumoniae - MIC*    AMPICILLIN 16 RESISTANT Resistant     CEFAZOLIN <=4 SENSITIVE Sensitive     CEFEPIME <=1 SENSITIVE Sensitive     CEFTAZIDIME <=1 SENSITIVE Sensitive     CEFTRIAXONE <=1 SENSITIVE Sensitive     CIPROFLOXACIN <=0.25 SENSITIVE Sensitive     GENTAMICIN <=1 SENSITIVE Sensitive     IMIPENEM <=0.25 SENSITIVE Sensitive     TRIMETH/SULFA <=20 SENSITIVE Sensitive      AMPICILLIN/SULBACTAM <=2 SENSITIVE Sensitive     PIP/TAZO <=4 SENSITIVE Sensitive     * KLEBSIELLA PNEUMONIAE  Blood Culture (routine x 2)     Status: None   Collection Time: 01/29/15  2:45 PM  Result Value Ref Range Status   Specimen Description BLOOD RIGHT ARM  Final   Special Requests   Final    BOTTLES DRAWN AEROBIC AND ANAEROBIC 5CC BOTH BOTTLES   Culture  Setup Time   Final    GRAM NEGATIVE RODS IN BOTH AEROBIC AND ANAEROBIC BOTTLES CRITICAL RESULT CALLED TO, READ BACK BY AND VERIFIED WITH: L. FLOOD,RN AT 0730 ON [id number] BY S. YARBROUGH    Culture   Final    AEROMONAS SOBRIA KLEBSIELLA PNEUMONIAE SUSCEPTIBILITIES PERFORMED ON PREVIOUS CULTURE WITHIN Jenna LAST 5 DAYS. Performed at [facility]    Report Status 02/03/2015 FINAL  Final  Urine culture     Status: None   Collection Time: 01/29/15  2:48 PM  Result Value Ref Range Status   Specimen Description URINE, CLEAN CATCH  Final   Special Requests NONE  Final   Culture   Final    NO GROWTH 1 DAY Performed at [facility]    Report Status 01/31/2015 FINAL  Final  MRSA PCR Screening     Status: None   Collection Time: 01/29/15  7:10 PM  Result Value Ref Range Status   MRSA by PCR NEGATIVE NEGATIVE Final    Comment:        [facility] GeneXpert MRSA Assay (FDA approved for NASAL specimens only), is one component of a comprehensive MRSA colonization surveillance program. It is not intended to diagnose MRSA infection nor to guide or monitor treatment for MRSA infections.   C difficile quick scan w PCR reflex     Status: None   Collection Time: 01/29/15 10:45 PM  Result Value Ref Range Status   C Diff antigen NEGATIVE NEGATIVE Final   C Diff toxin NEGATIVE NEGATIVE Final   C Diff interpretation Negative for toxigenic C. difficile  Final  Culture, Urine     Status: None   Collection Time: 01/30/15  8:52 AM  Result Value Ref Range Status   Specimen Description URINE, CATHETERIZED  Final   Special Requests  NONE  Final   Culture NO GROWTH 1 DAY  Final   Report Status 01/31/2015 FINAL  Final  Culture, blood (routine x 2)  Status: None (Preliminary result)   Collection Time: 02/02/15 10:45 AM  Result Value Ref Range Status   Specimen Description BLOOD LEFT HAND  Final   Special Requests BOTTLES DRAWN AEROBIC ONLY 4CCS  Final   Culture NO GROWTH 1 DAY  Final   Report Status PENDING  Incomplete     Studies:  Recent x-ray studies have been reviewed in detail by Jenna Attending Physician  Scheduled Meds:  Scheduled Meds: . antiseptic oral rinse  7 mL Mouth Rinse BID  . cefTAZidime (FORTAZ)  IV  1 g Intravenous Q24H  . LORazepam  1 mg Intravenous QHS  . metoprolol tartrate  25 mg Oral BID  . sertraline  50 mg Oral q morning - 10a    Time spent on care of this patient: 40 mins   Tahani Potier, Geraldo Docker , MD  [facility] Office  [phone] Pager [phone]  On-Call/Text Page:      [email or web address]      password [email or web address]  If 7PM-7AM, please contact night-coverage [email or web address] Password [email or web address] 02/03/2015, 8:57 PM   LOS: 5 days   Care during Jenna described time interval was provided by me .  I have reviewed this patient's available data, including medical history, events of note, physical examination, and all test results as part of my evaluation. I have personally reviewed and interpreted all radiology studies.   Dia Crawford, MD [phone] Pager

## 2015-02-03 NOTE — Care Management Note (Signed)
Case Management Note  Patient Details  Name: Jenna [name] MRN: [id number] Date of Birth: 1931-11-30  Subjective/Objective:        Lives at home alone.  Independent prior.  Daughter lives in [place] and both her and her husband work -days and would be gone 4 out of 3 days per week.  Plan is for Patient to go home with daughter. PT ordered.  Patient states very weak getting to chair yesterday, definitely states needs assistance.  Not opposed to ST rehab if needed.              Action/Plan:   Expected Discharge Date:                  Expected Discharge Plan:  [place]  In-House Referral:     Discharge planning Services  CM Consult  Post Acute Care Choice:    Choice offered to:     DME Arranged:    DME Agency:     HH Arranged:    [place] Agency:     Status of Service:  In process, will continue to follow  Medicare Important Message Given:    Date Medicare IM Given:    Medicare IM give by:    Date Additional Medicare IM Given:    Additional Medicare Important Message give by:     If discussed at [place] of Stay Meetings, dates discussed:    Additional Comments: PT recommending SNF - SW consult placed.  Vergie Living, RN 02/03/2015, 3:05 PM

## 2015-02-04 ENCOUNTER — Inpatient Hospital Stay (HOSPITAL_COMMUNITY): Payer: PPO

## 2015-02-04 LAB — CBC WITH DIFFERENTIAL/PLATELET
BASOS PCT: 1 %
Basophils Absolute: 0.3 10*3/uL — ABNORMAL HIGH (ref 0.0–0.1)
EOS PCT: 0 %
Eosinophils Absolute: 0 10*3/uL (ref 0.0–0.7)
HCT: 40.3 % (ref 36.0–46.0)
Hemoglobin: 14.1 g/dL (ref 12.0–15.0)
LYMPHS ABS: 0.8 10*3/uL (ref 0.7–4.0)
Lymphocytes Relative: 3 %
MCH: 27.4 pg (ref 26.0–34.0)
MCHC: 36 g/dL (ref 30.0–36.0)
MCV: 76 fL — AB (ref 78.0–100.0)
MONO ABS: 1.3 10*3/uL — AB (ref 0.1–1.0)
Monocytes Relative: 5 %
Neutro Abs: 24.4 10*3/uL — ABNORMAL HIGH (ref 1.7–7.7)
Neutrophils Relative %: 91 %
PLATELETS: 123 10*3/uL — AB (ref 150–400)
RBC: 5.3 MIL/uL — ABNORMAL HIGH (ref 3.87–5.11)
RDW: 16.9 % — AB (ref 11.5–15.5)
WBC: 26.8 10*3/uL — AB (ref 4.0–10.5)

## 2015-02-04 LAB — COMPREHENSIVE METABOLIC PANEL
ALK PHOS: UNDETERMINED U/L (ref 38–126)
ALK PHOS: 259 U/L — AB (ref 38–126)
ALT: UNDETERMINED U/L (ref 14–54)
ALT: 28 U/L (ref 14–54)
ANION GAP: 11 (ref 5–15)
AST: UNDETERMINED U/L (ref 15–41)
AST: 27 U/L (ref 15–41)
Albumin: 1.8 g/dL — ABNORMAL LOW (ref 3.5–5.0)
Albumin: 1.8 g/dL — ABNORMAL LOW (ref 3.5–5.0)
Anion gap: 13 (ref 5–15)
BILIRUBIN TOTAL: UNDETERMINED mg/dL (ref 0.3–1.2)
BILIRUBIN TOTAL: 1.8 mg/dL — AB (ref 0.3–1.2)
BUN: 46 mg/dL — ABNORMAL HIGH (ref 6–20)
BUN: 42 mg/dL — ABNORMAL HIGH (ref 6–20)
CALCIUM: 7.8 mg/dL — AB (ref 8.9–10.3)
CALCIUM: 7.8 mg/dL — AB (ref 8.9–10.3)
CHLORIDE: 105 mmol/L (ref 101–111)
CO2: 14 mmol/L — ABNORMAL LOW (ref 22–32)
CO2: 17 mmol/L — ABNORMAL LOW (ref 22–32)
CREATININE: 1.52 mg/dL — AB (ref 0.44–1.00)
Chloride: 105 mmol/L (ref 101–111)
Creatinine, Ser: 1.48 mg/dL — ABNORMAL HIGH (ref 0.44–1.00)
GFR, EST AFRICAN AMERICAN: 36 mL/min — AB (ref >60)
GFR, EST AFRICAN AMERICAN: 37 mL/min — AB (ref >60)
GFR, EST NON AFRICAN AMERICAN: 31 mL/min — AB (ref >60)
GFR, EST NON AFRICAN AMERICAN: 32 mL/min — AB (ref >60)
Glucose, Bld: 159 mg/dL — ABNORMAL HIGH (ref 65–99)
Glucose, Bld: 174 mg/dL — ABNORMAL HIGH (ref 65–99)
POTASSIUM: 3.9 mmol/L (ref 3.5–5.1)
Potassium: 6 mmol/L — ABNORMAL HIGH (ref 3.5–5.1)
Sodium: 132 mmol/L — ABNORMAL LOW (ref 135–145)
Sodium: 133 mmol/L — ABNORMAL LOW (ref 135–145)
TOTAL PROTEIN: 4.9 g/dL — AB (ref 6.5–8.1)
TOTAL PROTEIN: 5 g/dL — AB (ref 6.5–8.1)

## 2015-02-04 LAB — TROPONIN I
TROPONIN I: 0.98 ng/mL — AB (ref <0.031)
Troponin I: 1.34 ng/mL (ref <0.031)
Troponin I: 0.5 ng/mL (ref <0.031)

## 2015-02-04 LAB — BASIC METABOLIC PANEL
Anion gap: 13 (ref 5–15)
BUN: 41 mg/dL — AB (ref 6–20)
CO2: 14 mmol/L — AB (ref 22–32)
Calcium: 7.9 mg/dL — ABNORMAL LOW (ref 8.9–10.3)
Chloride: 105 mmol/L (ref 101–111)
Creatinine, Ser: 1.69 mg/dL — ABNORMAL HIGH (ref 0.44–1.00)
GFR calc Af Amer: 32 mL/min — ABNORMAL LOW (ref >60)
GFR, EST NON AFRICAN AMERICAN: 27 mL/min — AB (ref >60)
GLUCOSE: 178 mg/dL — AB (ref 65–99)
POTASSIUM: 3.9 mmol/L (ref 3.5–5.1)
Sodium: 132 mmol/L — ABNORMAL LOW (ref 135–145)

## 2015-02-04 LAB — MAGNESIUM: Magnesium: 1.7 mg/dL (ref 1.7–2.4)

## 2015-02-04 LAB — LACTIC ACID, PLASMA: LACTIC ACID, VENOUS: 1.7 mmol/L (ref 0.5–2.0)

## 2015-02-04 MED ORDER — SERTRALINE HCL 50 MG PO TABS
75.0000 mg | ORAL_TABLET | Freq: Every morning | ORAL | Status: DC
  Administered 2015-02-05 – 2015-02-09 (×5): 75 mg via ORAL
  Filled 2015-02-04: qty 2
  Filled 2015-02-04: qty 1
  Filled 2015-02-04 (×2): qty 2
  Filled 2015-02-04 (×2): qty 1

## 2015-02-04 MED ORDER — FUROSEMIDE 10 MG/ML IJ SOLN
80.0000 mg | Freq: Once | INTRAMUSCULAR | Status: AC
  Administered 2015-02-04: 80 mg via INTRAVENOUS
  Filled 2015-02-04: qty 8

## 2015-02-04 MED ORDER — SERTRALINE HCL 25 MG PO TABS
25.0000 mg | ORAL_TABLET | Freq: Once | ORAL | Status: DC
  Filled 2015-02-04: qty 1

## 2015-02-04 MED ORDER — LEVALBUTEROL HCL 1.25 MG/0.5ML IN NEBU
1.2500 mg | INHALATION_SOLUTION | Freq: Once | RESPIRATORY_TRACT | Status: DC
  Filled 2015-02-04: qty 0.5

## 2015-02-04 MED ORDER — METHYLPREDNISOLONE SODIUM SUCC 125 MG IJ SOLR
60.0000 mg | Freq: Once | INTRAMUSCULAR | Status: AC
  Administered 2015-02-04: 60 mg via INTRAVENOUS
  Filled 2015-02-04: qty 2

## 2015-02-04 MED ORDER — METOPROLOL TARTRATE 1 MG/ML IV SOLN
7.5000 mg | Freq: Once | INTRAVENOUS | Status: AC
  Administered 2015-02-04: 7.5 mg via INTRAVENOUS
  Filled 2015-02-04: qty 10

## 2015-02-04 MED ORDER — FUROSEMIDE 10 MG/ML IJ SOLN: 60.0000 mg | Freq: Once | INTRAMUSCULAR | Status: DC

## 2015-02-04 NOTE — NC FL2 (Signed)
[place] MEDICAID FL2 LEVEL OF CARE SCREENING TOOL     IDENTIFICATION  Patient Name: Jenna [name] Birthdate: 17-Jan-1932 Sex: female Admission Date (Current Location): 01/29/2015  [facility] and [place] Number: Herbalist and Address:  [facility] [place], [place] [address] [place], [place] [address]      Provider Number: [id number]  Attending Physician Name and Address:  Allie Bossier, MD  Relative Name and Phone Number:       Current Level of Care: SNF Recommended Level of Care: [place] Prior Approval Number:    Date Approved/Denied:   PASRR Number: [id number]  Discharge Plan: SNF    Current Diagnoses: Patient Active Problem List   Diagnosis Date Noted  . Acute pulmonary edema (HCC)   . Pleural effusion, right   . Elevated troponin   . Pain in the chest   . Sepsis due to Klebsiella ([place])   . Bacteremia due to Klebsiella pneumoniae   . HLD (hyperlipidemia)   . Essential hypertension   . Acute respiratory failure with hypoxia ([place])   . Sepsis with multiple organ dysfunction (MOD) ([place])   . Bacteremia   . Sepsis ([place]) 01/29/2015  . ARF (acute renal failure) ([place]) 01/29/2015  . Thrombocytopenia ([place]) 01/29/2015  . Hypokalemia 01/29/2015  . Elevated LFTs 01/29/2015  . Breast cancer of upper-outer quadrant of right female breast ([place]) 12/29/2012    Orientation ACTIVITIES/SOCIAL BLADDER RESPIRATION    Time, Self, Situation, Place  Active Continent    BEHAVIORAL SYMPTOMS/MOOD NEUROLOGICAL BOWEL NUTRITION STATUS      Continent    PHYSICIAN VISITS COMMUNICATION OF NEEDS Height & Weight Skin    Verbally   155 lbs. Normal          AMBULATORY STATUS RESPIRATION    Assist extensive        Personal Care Assistance Level of Assistance  Bathing, Dressing Bathing Assistance: Maximum assistance   Dressing Assistance: Maximum assistance      Functional Limitations Info                SPECIAL CARE FACTORS  FREQUENCY                      Additional Factors Info                  Current Medications (02/04/2015):  This is the current hospital active medication list Current Facility-Administered Medications  Medication Dose Route Frequency Provider Last Rate Last Dose  . 0.9 %  sodium chloride infusion  250 mL Intravenous PRN Luz Brazen, MD      . 0.9 %  sodium chloride infusion   Intravenous Continuous Allie Bossier, MD 10 mL/hr at 02/04/15 [phone]    . acetaminophen (TYLENOL) tablet 650 mg  650 mg Oral Q6H PRN Rise Patience, MD       Or  . acetaminophen (TYLENOL) suppository 650 mg  650 mg Rectal Q6H PRN Rise Patience, MD      . antiseptic oral rinse (CPC / CETYLPYRIDINIUM CHLORIDE 0.05%) solution 7 mL  7 mL Mouth Rinse BID Javier Glazier, MD   7 mL at 02/04/15 0853  . cefTAZidime (FORTAZ) 1 g in dextrose 5 % 50 mL IVPB  1 g Intravenous Q24H Priscella Mann, RPH   1 g at 02/04/15 0853  . hydrALAZINE (APRESOLINE) injection 10 mg  10 mg Intravenous Q4H PRN Allie Bossier, MD      .  loperamide (IMODIUM) 1 MG/5ML solution 2 mg  2 mg Oral PRN Janora Norlander, DO   2 mg at 01/30/15 2020  . LORazepam (ATIVAN) tablet 1 mg  1 mg Oral BID BM & HS PRN Dianne Dun, NP   1 mg at 02/04/15 1202  . metoprolol tartrate (LOPRESSOR) tablet 25 mg  25 mg Oral BID Allie Bossier, MD   25 mg at 02/04/15 [id number]  . ondansetron (ZOFRAN) tablet 4 mg  4 mg Oral Q6H PRN Rise Patience, MD       Or  . ondansetron [facility]) injection 4 mg  4 mg Intravenous Q6H PRN Rise Patience, MD      . sertraline (ZOLOFT) tablet 25 mg  25 mg Oral Once Allie Bossier, MD      . Derrill Memo ON 02/05/2015] sertraline (ZOLOFT) tablet 75 mg  75 mg Oral q morning - 10a Allie Bossier, MD         Discharge Medications: Please see discharge summary for a list of discharge medications.  Relevant Imaging Results:  Relevant Lab Results:  Recent Labs    Additional Information    Matilde Bash, LCSW

## 2015-02-04 NOTE — Clinical Social Work Placement (Signed)
   CLINICAL SOCIAL WORK PLACEMENT  NOTE  Date:  02/04/2015  Patient Details  Name: Jenna [name] MRN: [id number] Date of Birth: [birth year]  Clinical Social Work is seeking post-discharge placement for this patient at the [place] level of care (*CSW will initial, date and re-position this form in  chart as items are completed):  Yes   Patient/family provided with [place] Work Department's list of facilities offering this level of care within the geographic area requested by the patient (or if unable, by the patient's family).  Yes   Patient/family informed of their freedom to choose among providers that offer the needed level of care, that participate in Medicare, Medicaid or managed care program needed by the patient, have an available bed and are willing to accept the patient.  Yes   Patient/family informed of [facility]'s ownership interest in [facility] and [facility], as well as of the fact that they are under no obligation to receive care at these facilities.  PASRR submitted to EDS on 02/04/15     PASRR number received on 02/04/15     Existing PASRR number confirmed on       FL2 transmitted to all facilities in geographic area requested by pt/family on 02/04/15     FL2 transmitted to all facilities within larger geographic area on       Patient informed that his/her managed care company has contracts with or will negotiate with certain facilities, including the following:            Patient/family informed of bed offers received.  Patient chooses bed at       Physician recommends and patient chooses bed at      Patient to be transferred to   on  .  Patient to be transferred to facility by       Patient family notified on   of transfer.  Name of family member notified:        PHYSICIAN       Additional Comment:    _______________________________________________ Matilde Bash, [place] 02/04/2015, 2:02  PM

## 2015-02-04 NOTE — Clinical Social Work Note (Signed)
Clinical Social Work Assessment  Patient Details  Name: Jenna [name] MRN: [id number] Date of Birth: [birth year]  Date of referral:  02/04/15               Reason for consult:  Facility Placement                Permission sought to share information with:  Family Supports Permission granted to share information::  Yes, Verbal Permission Granted  Name::     Dorian Pod  Agency::  [facility]  Relationship::  daughter  Contact Information:  [phone]  Housing/Transportation Living arrangements for the past 2 months:  [place] of Information:  Adult Children Patient Interpreter Needed:  None Criminal Activity/Legal Involvement Pertinent to Current Situation/Hospitalization:  No - Comment as needed Significant Relationships:  Adult Children Lives with:  Self Do you feel safe going back to the place where you live?  No Need for family participation in patient care:  No (Coment)  Care giving concerns: None   Facilities manager / plan:  Met with Pt's daughter at bedside.  Daughter stated that they have talked about SNF and that they are in agreement that it's warranted.  Pt was independent prior to hospitalization but they feel that this incident has gotten her too far down to d/c home, at this point.  Daughter stated that they'd like [facility] and that, after [facility], Pt will go stay with her for a period of time.  Daughter in agreement to SNF search in [place], in the off-chance that [facility] an option.  Daughter lives in [place] and would like to look at [facility], if [facility] isn't an option.  Employment status:  Retired Nurse, adult PT Recommendations:  Not assessed at this time [place] / Referral to community resources:   [facility] list  Patient/Family's Response to care:  Pt and daughter in agreement with SNF.  Patient/Family's Understanding of and Emotional Response to  Diagnosis, Current Treatment, and Prognosis:  Daughter stated that she feels good about SNF as Pt's d/c plan.  Emotional Assessment Appearance:  Appears stated age Attitude/Demeanor/Rapport:   (calm) Affect (typically observed):  Calm Orientation:  Oriented to Self, Oriented to Place, Oriented to  Time, Oriented to Situation Alcohol / Substance use:  Never Used Psych involvement (Current and /or in the community):  No (Comment)  Discharge Needs  Concerns to be addressed:  No discharge needs identified Readmission within the last 30 days:  No Current discharge risk:  None Barriers to Discharge:  No Barriers Identified   Matilde Bash, [place] 02/04/2015, 1:54 PM

## 2015-02-04 NOTE — Progress Notes (Signed)
Jenna [name] TEAM 1 - Stepdown/ICU TEAM Progress Note  Jenna [name] [id number] DOB: 01/09/1932 DOA: 01/29/2015 PCP: Vena Austria, MD  Admit HPI / Brief Narrative: 81 y.o.WF PMHx Anxiety HTN, HLD, Rt IDC Breast Ca in remission   Brought [facility] the [facility] at [facility] [place] because of patient having persistent diarrhea over the last 4 days. Patient states patient has been having multiple episodes of diarrhea to the point that patient became very weak and was unable to walk. Has been having some nausea denies any vomiting. Patient was unable to eat because of the nausea. Has been having cramping abdominal pain. Patient has not had any recent travel or sick contacts. Has had a TV dinner 6 days ago. In the ER patient was found to be hypotensive and was given 3 L fluid bolus. Lactic acid was elevated with creatinine markedly elevated from baseline with metabolic acidosis severe hypokalemia and hypomagnesemia. Patient has been admitted for sepsis from intra-abdominal source most likely gastroenteritis/colitis. On my exam patient's abdomen appears benign. Patient's blood pressure is still in the [id number] systolic. Patient is alert awake and following commands. Patient states her diarrhea was dark in color.   HPI/Subjective: 12/3 A/O 4, positive pain starting on Lt side of Lt Breast w/ radiation to Lt neck/shoulder, positive Increased SOB, describes as sharp, rated 10/10, positive  SOB. States just started ~ 5 min ago.     Assessment/Plan: Sepsis from intra-abdominal source most likely gastroenteritis/colitis  -Was started on Cipro and Flagyl empirically. -GI pathogen panel pending -fecal lactoferrin pending  Bacteremia positive AEROMONAS SOBRIA/ KLEBSIELLA PNEUMONIAE -Cipro and Flagyl stopped per ID -Continue Fortaz per ID  Leukocytosis -Patient afebrile, negative bands or left shift most likely secondary to deep margination, secondary to her hypertensive urgency  Acute renal failure  with metabolic acidosis  -probably from hypotension dehydration, ARB use -DC all fluids secondary to fluid overload  Acute Pulmonary edema/right pleural effusion -Lasix 80 mg  -Strict I&O -Daily weight -D/Ced all IV fluids   Bronchospasm -Patient wheezing Xopenex nebulizer -Solu-Medrol 60 mg 1 - Chest Pain/Elevated troponin -12/2 EKG compared to 11/27 EKG; RBBB resolved, V4-V6 9 essentially normal T-wave -Trend troponin;; most likely demand ischemia -12/3 initial troponin yesterday 2.28-->0.98 -Left neck/arm pain resolved, some residual lateral walls/LUQ abdominal pain.  HTN -Hydralazine 10 mg q 4hr PRN SBP> 165 or DBP> 100 -Metoprolol 25 mg BID -Metoprolol IV 7.5 mg 1 -Lasix 80 mg 1 -Hold ACEI or ARB secondary to renal failure  Abdominal Pain -KUB  - Thrombocytopenia probably from infectious causes.  -Slowly trending up -Closely monitor for DIC.  Hypokalemia Awaiting repeat CMP  Hyperlipidemia  -  Anxiety -Ativan 1 mg BID PRN -Increase Zoloft to 75 mg daily  -Zoloft 25 mg 1 today  Hyperglycemia -Sensitive SSI -Hemoglobin A1c pending   Code Status: FULL Family Communication: Daughter present at time of exam Disposition Plan: Home    Consultants: Dr.Jeffrey C Hatcher ID   Procedure/Significant Events: 11/29 echocardiogram;- LVEF= 65% to 70%. -(grade 1 diastolic dysfunction). 12/2 KUB;No acute abdominal or pelvic abnormality. Negative SBO  -Stable left basilar pleural fluid and probable atelectasis. 12/30 PCXR;Cardiomegaly with increasing interstitial edema and effusions compatible with congestive heart failure. 2. Bilateral airspace disease, left greater than right    Culture 11/27 blood left/right arm positive AEROMONAS SOBRIA/ KLEBSIELLA PNEUMONIAE 11/27 urine negative final 11/27 urine negative final 11/27 C diff Ag and toxin negative 11/27 GI pathogen panel negative 12/1 blood left hand NGTD   Antibiotics: Cipro  11/27-11/28   Flagy 11/27 >> 11/28 Ceftaz 11/28 >>  DVT prophylaxis: SCD   Devices    LINES / TUBES:      Continuous Infusions: . sodium chloride 10 mL/hr at 02/04/15 0854    Objective: VITAL SIGNS: Temp: 98.6 F (37 C) (12/03 0751) Temp Source: Oral (12/03 0751) BP: 151/96 mmHg (12/03 1120) Pulse Rate: 109 (12/03 1120) SPO2; FIO2:   Intake/Output Summary (Last 24 hours) at 02/04/15 1249 Last data filed at 02/04/15 1200  Gross per 24 hour  Intake    870 ml  Output   2477 ml  Net  -1607 ml     Exam: General: A/O 4, positive acute respiratory distress (requiring 2 L O2) Eyes: Negative headache, negative scleral hemorrhage ENT: Negative Runny nose, negative ear pain, negative gingival bleeding, Neck:  Negative scars, masses, torticollis, lymphadenopathy, JVD Lungs: decreased breath sounds bilateral with mild crackles, positive wheezes apices  Cardiovascular: Tachycardic, Regular rhythm without murmur gallop or rub normal S1 and S2 Abdomen: Positive abdominal pain LUQ to palpation, nondistended, positive soft, bowel sounds, no rebound, no ascites, no appreciable mass Extremities: No significant cyanosis, clubbing, or edema bilateral lower extremities Psychiatric:  Negative depression, negative anxiety, negative fatigue, negative mania  Neurologic:  Cranial nerves II through XII intact, tongue/uvula midline, all extremities muscle strength 5/5, sensation intact throughout, negative dysarthria, negative expressive aphasia, negative receptive aphasia.   Data Reviewed: Basic Metabolic Panel:  Recent Labs Lab 01/31/15 0306 02/01/15 0742 02/02/15 0330 02/02/15 1414 02/03/15 0232 02/03/15 1608 02/04/15 0448  NA 134* 135 133*  --  132* 130* 132*  K 4.1 3.3* 2.9* 3.0* 3.0* 3.7 3.9  CL 109 110 108  --  105 103 105  CO2 11* 15* 17*  --  17* 15* 14*  GLUCOSE 105* 107* 111*  --  117* 162* 178*  BUN 46* 49* 46*  --  40* 40* 41*  CREATININE 3.57* 3.13* 2.54*  --  2.05* 1.85*  1.69*  CALCIUM 6.9* 7.5* 8.0*  --  7.8* 7.9* 7.9*  MG 2.9* 2.6* 2.4 2.4 2.0 1.9 1.7  PHOS 2.3* 2.5 2.9  --   --   --   --    Liver Function Tests:  Recent Labs Lab 01/29/15 1445 01/30/15 0805 01/31/15 0306 02/01/15 0742 02/02/15 0330  AST 98* 76*  --   --   --   ALT 98* 79*  --   --   --   ALKPHOS 170* 120  --   --   --   BILITOT 2.1* 1.9*  --   --   --   PROT 6.2* 4.7*  --   --   --   ALBUMIN 2.7* 1.9* 1.9* 1.9* 1.7*   No results for input(s): LIPASE, AMYLASE in the last 168 hours. No results for input(s): AMMONIA in the last 168 hours. CBC:  Recent Labs Lab 01/31/15 0306 02/01/15 0742 02/02/15 0330 02/03/15 0232 02/04/15 0448  WBC 13.5* 15.1* 15.9* 16.9* 26.8*  NEUTROABS 9.9* 13.1* 13.8* 15.4* 24.4*  HGB 12.8 12.1 11.2* 11.0* 14.1  HCT 37.5 33.7* 31.4* 31.5* 40.3  MCV 78.6 75.9* 76.4* 76.3* 76.0*  PLT 76* 83* 93* 93* 123*   Cardiac Enzymes:  Recent Labs Lab 01/30/15 0237 01/30/15 0805 02/03/15 1608 02/03/15 2119 02/04/15 0448  TROPONINI 0.09* 0.11* 2.28* 1.85* 1.34*   BNP (last 3 results) No results for input(s): BNP in the last 8760 hours.  ProBNP (last 3 results) No results for input(s): PROBNP in the  last 8760 hours.  CBG:  Recent Labs Lab 01/30/15 0103 01/30/15 0216  GLUCAP 117* 92    Recent Results (from the past 240 hour(s))  Blood Culture (routine x 2)     Status: None   Collection Time: 01/29/15  2:45 PM  Result Value Ref Range Status   Specimen Description BLOOD LEFT ARM  Final   Special Requests   Final    BOTTLES DRAWN AEROBIC AND ANAEROBIC 5CC BOTH BOTTLES   Culture  Setup Time   Final    GRAM NEGATIVE RODS IN BOTH AEROBIC AND ANAEROBIC BOTTLES CRITICAL RESULT CALLED TO, READ BACK BY AND VERIFIED WITH: L. FLOOD,RN AT 0730 ON [id number] BY Rhea Bleacher    Culture   Final    AEROMONAS SOBRIA KLEBSIELLA PNEUMONIAE Performed at [facility]    Report Status 02/03/2015 FINAL  Final   Organism ID, Bacteria AEROMONAS SOBRIA   Final   Organism ID, Bacteria KLEBSIELLA PNEUMONIAE  Final      Susceptibility   Aeromonas sobria - MIC*    CEFAZOLIN 8 SENSITIVE Sensitive     GENTAMICIN <=1 SENSITIVE Sensitive     CIPROFLOXACIN <=0.25 SENSITIVE Sensitive     IMIPENEM 8 INTERMEDIATE Intermediate     TRIMETH/SULFA <=20 SENSITIVE Sensitive     * AEROMONAS SOBRIA   Klebsiella pneumoniae - MIC*    AMPICILLIN 16 RESISTANT Resistant     CEFAZOLIN <=4 SENSITIVE Sensitive     CEFEPIME <=1 SENSITIVE Sensitive     CEFTAZIDIME <=1 SENSITIVE Sensitive     CEFTRIAXONE <=1 SENSITIVE Sensitive     CIPROFLOXACIN <=0.25 SENSITIVE Sensitive     GENTAMICIN <=1 SENSITIVE Sensitive     IMIPENEM <=0.25 SENSITIVE Sensitive     TRIMETH/SULFA <=20 SENSITIVE Sensitive     AMPICILLIN/SULBACTAM <=2 SENSITIVE Sensitive     PIP/TAZO <=4 SENSITIVE Sensitive     * KLEBSIELLA PNEUMONIAE  Blood Culture (routine x 2)     Status: None   Collection Time: 01/29/15  2:45 PM  Result Value Ref Range Status   Specimen Description BLOOD RIGHT ARM  Final   Special Requests   Final    BOTTLES DRAWN AEROBIC AND ANAEROBIC 5CC BOTH BOTTLES   Culture  Setup Time   Final    GRAM NEGATIVE RODS IN BOTH AEROBIC AND ANAEROBIC BOTTLES CRITICAL RESULT CALLED TO, READ BACK BY AND VERIFIED WITH: L. FLOOD,RN AT 0730 ON [id number] BY S. YARBROUGH    Culture   Final    AEROMONAS SOBRIA KLEBSIELLA PNEUMONIAE SUSCEPTIBILITIES PERFORMED ON PREVIOUS CULTURE WITHIN THE LAST 5 DAYS. Performed at [facility]    Report Status 02/03/2015 FINAL  Final  Urine culture     Status: None   Collection Time: 01/29/15  2:48 PM  Result Value Ref Range Status   Specimen Description URINE, CLEAN CATCH  Final   Special Requests NONE  Final   Culture   Final    NO GROWTH 1 DAY Performed at [facility]    Report Status 01/31/2015 FINAL  Final  MRSA PCR Screening     Status: None   Collection Time: 01/29/15  7:10 PM  Result Value Ref Range Status   MRSA by PCR  NEGATIVE NEGATIVE Final    Comment:        The GeneXpert MRSA Assay (FDA approved for NASAL specimens only), is one component of a comprehensive MRSA colonization surveillance program. It is not intended to diagnose MRSA infection nor to guide or monitor treatment  for MRSA infections.   C difficile quick scan w PCR reflex     Status: None   Collection Time: 01/29/15 10:45 PM  Result Value Ref Range Status   C Diff antigen NEGATIVE NEGATIVE Final   C Diff toxin NEGATIVE NEGATIVE Final   C Diff interpretation Negative for toxigenic C. difficile  Final  Culture, Urine     Status: None   Collection Time: 01/30/15  8:52 AM  Result Value Ref Range Status   Specimen Description URINE, CATHETERIZED  Final   Special Requests NONE  Final   Culture NO GROWTH 1 DAY  Final   Report Status 01/31/2015 FINAL  Final  Culture, blood (routine x 2)     Status: None (Preliminary result)   Collection Time: 02/02/15 10:45 AM  Result Value Ref Range Status   Specimen Description BLOOD LEFT HAND  Final   Special Requests BOTTLES DRAWN AEROBIC ONLY 4CCS  Final   Culture NO GROWTH 1 DAY  Final   Report Status PENDING  Incomplete     Studies:  Recent x-ray studies have been reviewed in detail by the Attending Physician  Scheduled Meds:  Scheduled Meds: . antiseptic oral rinse  7 mL Mouth Rinse BID  . cefTAZidime (FORTAZ)  IV  1 g Intravenous Q24H  . LORazepam  1 mg Intravenous QHS  . metoprolol tartrate  25 mg Oral BID  . sertraline  50 mg Oral q morning - 10a    Time spent on care of this patient: 40 mins   Yevonne Yokum, Geraldo Docker , MD  [facility] Office  [phone] Pager [phone]  On-Call/Text Page:      [email or web address]      password [email or web address]  If 7PM-7AM, please contact night-coverage [email or web address] Password [email or web address] 02/04/2015, 12:49 PM   LOS: 6 days   Care during the described time interval was provided by me .  I have reviewed this patient's available data, including medical  history, events of note, physical examination, and all test results as part of my evaluation. I have personally reviewed and interpreted all radiology studies.   Dia Crawford, MD [phone] Pager

## 2015-02-04 NOTE — Progress Notes (Signed)
Pt requesting a breathing treatment from respiratory. Wheezing upon ausculation. Paged Dr. Sherral Hammers. Awaiting call back.

## 2015-02-04 NOTE — Progress Notes (Signed)
Dr. Sherral Hammers returned page. New orders received. Will continue to monitor pt.

## 2015-02-05 DIAGNOSIS — J811 Chronic pulmonary edema: Secondary | ICD-10-CM | POA: Diagnosis present

## 2015-02-05 DIAGNOSIS — I519 Heart disease, unspecified: Secondary | ICD-10-CM

## 2015-02-05 DIAGNOSIS — N179 Acute kidney failure, unspecified: Secondary | ICD-10-CM | POA: Diagnosis present

## 2015-02-05 DIAGNOSIS — I5189 Other ill-defined heart diseases: Secondary | ICD-10-CM | POA: Diagnosis present

## 2015-02-05 DIAGNOSIS — F411 Generalized anxiety disorder: Secondary | ICD-10-CM | POA: Diagnosis present

## 2015-02-05 LAB — CBC WITH DIFFERENTIAL/PLATELET
BASOS ABS: 0 10*3/uL (ref 0.0–0.1)
Basophils Relative: 0 %
Eosinophils Absolute: 0 10*3/uL (ref 0.0–0.7)
Eosinophils Relative: 0 %
HCT: 35.9 % — ABNORMAL LOW (ref 36.0–46.0)
Hemoglobin: 12.9 g/dL (ref 12.0–15.0)
LYMPHS ABS: 1.4 10*3/uL (ref 0.7–4.0)
Lymphocytes Relative: 6 %
MCH: 27.1 pg (ref 26.0–34.0)
MCHC: 35.9 g/dL (ref 30.0–36.0)
MCV: 75.4 fL — ABNORMAL LOW (ref 78.0–100.0)
MONO ABS: 0.7 10*3/uL (ref 0.1–1.0)
MONOS PCT: 3 %
Neutro Abs: 21.7 10*3/uL — ABNORMAL HIGH (ref 1.7–7.7)
Neutrophils Relative %: 91 %
Platelets: 59 10*3/uL — ABNORMAL LOW (ref 150–400)
RBC: 4.76 MIL/uL (ref 3.87–5.11)
RDW: 16.2 % — AB (ref 11.5–15.5)
WBC: 23.8 10*3/uL — AB (ref 4.0–10.5)

## 2015-02-05 LAB — CBC
HEMATOCRIT: 34 % — AB (ref 36.0–46.0)
HEMOGLOBIN: 11.8 g/dL — AB (ref 12.0–15.0)
MCH: 26.6 pg (ref 26.0–34.0)
MCHC: 34.7 g/dL (ref 30.0–36.0)
MCV: 76.7 fL — ABNORMAL LOW (ref 78.0–100.0)
Platelets: 136 10*3/uL — ABNORMAL LOW (ref 150–400)
RBC: 4.43 MIL/uL (ref 3.87–5.11)
RDW: 16.4 % — ABNORMAL HIGH (ref 11.5–15.5)
WBC: 22.3 10*3/uL — ABNORMAL HIGH (ref 4.0–10.5)

## 2015-02-05 LAB — COMPREHENSIVE METABOLIC PANEL
ALT: 27 U/L (ref 14–54)
AST: 25 U/L (ref 15–41)
Albumin: 1.7 g/dL — ABNORMAL LOW (ref 3.5–5.0)
Alkaline Phosphatase: 232 U/L — ABNORMAL HIGH (ref 38–126)
Anion gap: 10 (ref 5–15)
BILIRUBIN TOTAL: 1.9 mg/dL — AB (ref 0.3–1.2)
BUN: 42 mg/dL — AB (ref 6–20)
CALCIUM: 7.4 mg/dL — AB (ref 8.9–10.3)
CO2: 18 mmol/L — ABNORMAL LOW (ref 22–32)
CREATININE: 1.39 mg/dL — AB (ref 0.44–1.00)
Chloride: 103 mmol/L (ref 101–111)
GFR, EST AFRICAN AMERICAN: 40 mL/min — AB (ref >60)
GFR, EST NON AFRICAN AMERICAN: 35 mL/min — AB (ref >60)
Glucose, Bld: 151 mg/dL — ABNORMAL HIGH (ref 65–99)
Potassium: 3.6 mmol/L (ref 3.5–5.1)
SODIUM: 131 mmol/L — AB (ref 135–145)
TOTAL PROTEIN: 4.5 g/dL — AB (ref 6.5–8.1)

## 2015-02-05 LAB — BASIC METABOLIC PANEL
ANION GAP: 13 (ref 5–15)
BUN: 39 mg/dL — ABNORMAL HIGH (ref 6–20)
CALCIUM: 7.8 mg/dL — AB (ref 8.9–10.3)
CO2: 23 mmol/L (ref 22–32)
CREATININE: 1.36 mg/dL — AB (ref 0.44–1.00)
Chloride: 98 mmol/L — ABNORMAL LOW (ref 101–111)
GFR, EST AFRICAN AMERICAN: 41 mL/min — AB (ref >60)
GFR, EST NON AFRICAN AMERICAN: 35 mL/min — AB (ref >60)
GLUCOSE: 167 mg/dL — AB (ref 65–99)
Potassium: 3.1 mmol/L — ABNORMAL LOW (ref 3.5–5.1)
Sodium: 134 mmol/L — ABNORMAL LOW (ref 135–145)

## 2015-02-05 LAB — D-DIMER, QUANTITATIVE (NOT AT ARMC): D DIMER QUANT: 3.69 ug{FEU}/mL — AB (ref 0.00–0.50)

## 2015-02-05 LAB — PROTIME-INR
INR: 1.22 (ref 0.00–1.49)
PROTHROMBIN TIME: 15.5 s — AB (ref 11.6–15.2)

## 2015-02-05 LAB — SAVE SMEAR

## 2015-02-05 LAB — FECAL LACTOFERRIN, QUANT: Fecal Lactoferrin: POSITIVE

## 2015-02-05 LAB — MAGNESIUM
MAGNESIUM: 1.6 mg/dL — AB (ref 1.7–2.4)
Magnesium: 1.9 mg/dL (ref 1.7–2.4)

## 2015-02-05 LAB — TROPONIN I: Troponin I: 0.53 ng/mL (ref <0.031)

## 2015-02-05 LAB — APTT: aPTT: 26 seconds (ref 24–37)

## 2015-02-05 LAB — FIBRINOGEN: Fibrinogen: 695 mg/dL — ABNORMAL HIGH (ref 204–475)

## 2015-02-05 MED ORDER — FUROSEMIDE 10 MG/ML IJ SOLN
80.0000 mg | Freq: Two times a day (BID) | INTRAMUSCULAR | Status: AC
  Administered 2015-02-05 – 2015-02-07 (×6): 80 mg via INTRAVENOUS
  Filled 2015-02-05 (×5): qty 8

## 2015-02-05 MED ORDER — MAGNESIUM SULFATE 2 GM/50ML IV SOLN
2.0000 g | Freq: Once | INTRAVENOUS | Status: AC
  Administered 2015-02-05: 2 g via INTRAVENOUS
  Filled 2015-02-05: qty 50

## 2015-02-05 MED ORDER — POTASSIUM CHLORIDE CRYS ER 20 MEQ PO TBCR
50.0000 meq | EXTENDED_RELEASE_TABLET | Freq: Once | ORAL | Status: AC
  Administered 2015-02-05: 50 meq via ORAL
  Filled 2015-02-05: qty 2

## 2015-02-05 MED ORDER — METOPROLOL TARTRATE 25 MG PO TABS
37.5000 mg | ORAL_TABLET | Freq: Two times a day (BID) | ORAL | Status: DC
Start: 2015-02-05 — End: 2015-02-09
  Administered 2015-02-05 – 2015-02-08 (×7): 37.5 mg via ORAL
  Filled 2015-02-05 (×8): qty 1

## 2015-02-05 MED ORDER — INSULIN ASPART 100 UNIT/ML ~~LOC~~ SOLN
0.0000 [IU] | SUBCUTANEOUS | Status: DC
  Administered 2015-02-06 (×2): 1 [IU] via SUBCUTANEOUS

## 2015-02-05 MED ORDER — MAGNESIUM SULFATE 2 GM/50ML IV SOLN: 2.0000 g | Freq: Once | INTRAVENOUS | Status: DC

## 2015-02-05 MED ORDER — FUROSEMIDE 10 MG/ML IJ SOLN: 80.0000 mg | Freq: Once | INTRAMUSCULAR | Status: DC

## 2015-02-05 NOTE — Progress Notes (Signed)
[facility] TEAM 1 - Stepdown/ICU TEAM Progress Note  Jenna [name] [id number] DOB: 07-May-1931 DOA: 01/29/2015 PCP: Vena Austria, MD  Admit HPI / Brief Narrative: 81 y.o.WF PMHx Anxiety HTN, HLD, Rt IDC Breast Ca in remission   Brought to the ER at [facility] because of patient having persistent diarrhea over the last 4 days. Patient states patient has been having multiple episodes of diarrhea to the point that patient became very weak and was unable to walk. Has been having some nausea denies any vomiting. Patient was unable to eat because of the nausea. Has been having cramping abdominal pain. Patient has not had any recent travel or sick contacts. Has had a TV dinner 6 days ago. In the ER patient was found to be hypotensive and was given 3 L fluid bolus. Lactic acid was elevated with creatinine markedly elevated from baseline with metabolic acidosis severe hypokalemia and hypomagnesemia. Patient has been admitted for sepsis from intra-abdominal source most likely gastroenteritis/colitis. On my exam patient's abdomen appears benign. Patient's blood pressure is still in the [id number] systolic. Patient is alert awake and following commands. Patient states her diarrhea was dark in color.   HPI/Subjective: 12/4 sleepy, but arousable A/O 4, negative CP/neck pain. Improved SOB,    Assessment/Plan: Sepsis from intra-abdominal source most likely gastroenteritis/colitis  -Was started on Cipro and Flagyl empirically. -Fecal lactoferrin pending -GI pathogen panel negative  Bacteremia positive AEROMONAS SOBRIA/ KLEBSIELLA PNEUMONIAE -Cipro and Flagyl stopped per ID -Continue Fortaz per ID  Leukocytosis -Patient afebrile, negative bands or left shift most likely secondary to Demargination, secondary to her hypertensive urgency -Appears WBC may have peaked at 26.8--23.8   Acute renal failure with metabolic acidosis  -probably from hypotension dehydration, ARB use -Continues  to improve, DC all fluids secondary to fluid overload  Acute Pulmonary edema/right pleural effusion -Strict I&O since admission +6.9 L -Daily weight admission weight= 54.4 kg               12/4 bed weight= 65.8 kg -D/Ced all IV fluids  -Lasix 80 mg BID x 6 doses  Bronchospasm -Resolved  Chest Pain/Elevated troponin -12/2 EKG compared to 11/27 EKG; RBBB resolved, V4-V6 9 essentially normal T-wave -Trend troponin;; most likely demand ischemia -12/3 initial troponin yesterday 2.28-->0.98-->0.53 -Pain resolved  HTN/Diastolic Dysfunction -Hydralazine 10 mg q 4hr PRN SBP> 165 or DBP> 100 -Increase Metoprolol 37.5 mg BID -Lasix 80 mg BID x 6 doses -Continue Hold ACEI or ARB secondary to acute renal failure  Abdominal Pain -KUB; nondiagnostic  - Thrombocytopenia probably from infectious causes.  -Was slowly trending up however today dropped. Lab error vs true decline. Will also R/O DIC.  -CBC, PT/PTT, Fibrinogen, peripheral blood smear, d-dimer. -ADDENDUM; d-dimer came back as positive however doubt PE. Also doubt DIC, most likely secondary to demand ischemia. In addition recheck CBC showed platelets to actually be trending up. PT only mildly elevated and PTT normal. However fibrinogen elevated, may want to call path pathology this to put official read in chart.  Hypokalemia -Potassium goal> 4 -K- Dur 50 mEq  Hypomagnesemia -Magnesium goal> 2 -Magnesium IV 2 gm  Hyponatremia -Slightly low asymptomatic, most likely secondary to diuretics  Hyperglycemia -Sensitive SSI -Hemoglobin A1c pending  Hyperlipidemia  -  Anxiety -Ativan 1 mg BID PRN -Continue Zoloft to 75 mg daily      Code Status: FULL Family Communication: Daughter present at time of exam Disposition Plan: Home    Consultants: Dr.Jeffrey Chevis [facility]   Procedure/Significant  Events: 11/29 echocardiogram;- LVEF= 65% to 70%. -(grade 1 diastolic dysfunction). 12/2 KUB;No acute abdominal or pelvic  abnormality. Negative SBO  -Stable left basilar pleural fluid and probable atelectasis. 12/30 PCXR;Cardiomegaly with increasing interstitial edema and effusions compatible with congestive heart failure. -. Bilateral airspace disease, left greater than right    Culture 11/27 blood left/right arm positive AEROMONAS SOBRIA/ KLEBSIELLA PNEUMONIAE 11/27 urine negative final 11/27 urine negative final 11/27 C diff Ag and toxin negative 11/27 GI pathogen panel negative 12/1 blood left hand NGTD 12/3 GI pathogen panel pending   Antibiotics: Cipro 11/27-11/28  Flagy 11/27 >> 11/28 Ceftaz 11/28 >>  DVT prophylaxis: SCD   Devices    LINES / TUBES:      Continuous Infusions:    Objective: VITAL SIGNS: Temp: 97.7 F (36.5 C) (12/04 1942) Temp Source: Oral (12/04 1942) BP: 126/74 mmHg (12/04 1942) Pulse Rate: 112 (12/04 1942) SPO2; FIO2:   Intake/Output Summary (Last 24 hours) at 02/05/15 2051 Last data filed at 02/05/15 1903  Gross per 24 hour  Intake    530 ml  Output   4475 ml  Net  -3945 ml     Exam: General: A/O 4, positive acute respiratory distress (still requiring 2 L O2) Eyes: Negative headache, negative scleral hemorrhage ENT: Negative Runny nose, negative ear pain, negative gingival bleeding, Neck:  Negative scars, masses, torticollis, lymphadenopathy, JVD Lungs: tachypnea clear to auscultation bilaterally except for mild  right basilar crackles,  negative wheezes   Cardiovascular: Tachycardic, Regular rhythm without murmur gallop or rub normal S1 and S2 Abdomen:  Negative abdominal pain palpation, nondistended, positive soft, bowel sounds, no rebound, no ascites, no appreciable mass Extremities: No significant cyanosis, clubbing, or edema bilateral lower extremities Psychiatric:  Negative depression, negative anxiety, negative fatigue, negative mania  Neurologic:  Cranial nerves II through XII intact, tongue/uvula midline, all extremities muscle  strength 5/5, sensation intact throughout, negative dysarthria, negative expressive aphasia, negative receptive aphasia.   Data Reviewed: Basic Metabolic Panel:  Recent Labs Lab 01/31/15 0306 02/01/15 0742 02/02/15 0330  02/03/15 0232 02/03/15 1608 02/04/15 0448 02/04/15 1343 02/04/15 1902 02/05/15 0100 02/05/15 2000  NA 134* 135 133*  --  132* 130* 132* 132* 133* 131* 134*  K 4.1 3.3* 2.9*  < > 3.0* 3.7 3.9 6.0* 3.9 3.6 3.1*  CL 109 110 108  --  105 103 105 105 105 103 98*  CO2 11* 15* 17*  --  17* 15* 14* 14* 17* 18* 23  GLUCOSE 105* 107* 111*  --  117* 162* 178* 159* 174* 151* 167*  BUN 46* 49* 46*  --  40* 40* 41* 46* 42* 42* 39*  CREATININE 3.57* 3.13* 2.54*  --  2.05* 1.85* 1.69* 1.52* 1.48* 1.39* 1.36*  CALCIUM 6.9* 7.5* 8.0*  --  7.8* 7.9* 7.9* 7.8* 7.8* 7.4* 7.8*  MG 2.9* 2.6* 2.4  < > 2.0 1.9 1.7  --   --  1.6* 1.9  PHOS 2.3* 2.5 2.9  --   --   --   --   --   --   --   --   < > = values in this interval not displayed. Liver Function Tests:  Recent Labs Lab 01/30/15 0805  02/01/15 0742 02/02/15 0330 02/04/15 1343 02/04/15 1902 02/05/15 0100  AST 76*  --   --   --  QUANTITY NOT SUFFICIENT, UNABLE TO PERFORM TEST 27 25  ALT 79*  --   --   --  QUANTITY NOT SUFFICIENT,  UNABLE TO PERFORM TEST 28 27  ALKPHOS 120  --   --   --  QUANTITY NOT SUFFICIENT, UNABLE TO PERFORM TEST 259* 232*  BILITOT 1.9*  --   --   --  QUANTITY NOT SUFFICIENT, UNABLE TO PERFORM TEST 1.8* 1.9*  PROT 4.7*  --   --   --  4.9* 5.0* 4.5*  ALBUMIN 1.9*  < > 1.9* 1.7* 1.8* 1.8* 1.7*  < > = values in this interval not displayed. No results for input(s): LIPASE, AMYLASE in the last 168 hours. No results for input(s): AMMONIA in the last 168 hours. CBC:  Recent Labs Lab 02/01/15 0742 02/02/15 0330 02/03/15 0232 02/04/15 0448 02/05/15 0100 02/05/15 1500  WBC 15.1* 15.9* 16.9* 26.8* 23.8* 22.3*  NEUTROABS 13.1* 13.8* 15.4* 24.4* 21.7*  --   HGB 12.1 11.2* 11.0* 14.1 12.9 11.8*  HCT 33.7*  31.4* 31.5* 40.3 35.9* 34.0*  MCV 75.9* 76.4* 76.3* 76.0* 75.4* 76.7*  PLT 83* 93* 93* 123* 59* 136*   Cardiac Enzymes:  Recent Labs Lab 02/03/15 2119 02/04/15 0448 02/04/15 1343 02/04/15 1902 02/05/15 0100  TROPONINI 1.85* 1.34* 0.98* 0.50* 0.53*   BNP (last 3 results) No results for input(s): BNP in the last 8760 hours.  ProBNP (last 3 results) No results for input(s): PROBNP in the last 8760 hours.  CBG:  Recent Labs Lab 01/30/15 0103 01/30/15 0216  GLUCAP 117* 92    Recent Results (from the past 240 hour(s))  Blood Culture (routine x 2)     Status: None   Collection Time: 01/29/15  2:45 PM  Result Value Ref Range Status   Specimen Description BLOOD LEFT ARM  Final   Special Requests   Final    BOTTLES DRAWN AEROBIC AND ANAEROBIC 5CC BOTH BOTTLES   Culture  Setup Time   Final    GRAM NEGATIVE RODS IN BOTH AEROBIC AND ANAEROBIC BOTTLES CRITICAL RESULT CALLED TO, READ BACK BY AND VERIFIED WITH: L. FLOOD,RN AT 0730 ON [id number] BY Rhea Bleacher    Culture   Final    AEROMONAS SOBRIA KLEBSIELLA PNEUMONIAE Performed at [facility]    Report Status 02/03/2015 FINAL  Final   Organism ID, Bacteria AEROMONAS SOBRIA  Final   Organism ID, Bacteria KLEBSIELLA PNEUMONIAE  Final      Susceptibility   Aeromonas sobria - MIC*    CEFAZOLIN 8 SENSITIVE Sensitive     GENTAMICIN <=1 SENSITIVE Sensitive     CIPROFLOXACIN <=0.25 SENSITIVE Sensitive     IMIPENEM 8 INTERMEDIATE Intermediate     TRIMETH/SULFA <=20 SENSITIVE Sensitive     * AEROMONAS SOBRIA   Klebsiella pneumoniae - MIC*    AMPICILLIN 16 RESISTANT Resistant     CEFAZOLIN <=4 SENSITIVE Sensitive     CEFEPIME <=1 SENSITIVE Sensitive     CEFTAZIDIME <=1 SENSITIVE Sensitive     CEFTRIAXONE <=1 SENSITIVE Sensitive     CIPROFLOXACIN <=0.25 SENSITIVE Sensitive     GENTAMICIN <=1 SENSITIVE Sensitive     IMIPENEM <=0.25 SENSITIVE Sensitive     TRIMETH/SULFA <=20 SENSITIVE Sensitive     AMPICILLIN/SULBACTAM <=2  SENSITIVE Sensitive     PIP/TAZO <=4 SENSITIVE Sensitive     * KLEBSIELLA PNEUMONIAE  Blood Culture (routine x 2)     Status: None   Collection Time: 01/29/15  2:45 PM  Result Value Ref Range Status   Specimen Description BLOOD RIGHT ARM  Final   Special Requests   Final    BOTTLES DRAWN AEROBIC AND  ANAEROBIC 5CC BOTH BOTTLES   Culture  Setup Time   Final    GRAM NEGATIVE RODS IN BOTH AEROBIC AND ANAEROBIC BOTTLES CRITICAL RESULT CALLED TO, READ BACK BY AND VERIFIED WITH: L. FLOOD,RN AT 0730 ON [id number] BY S. YARBROUGH    Culture   Final    AEROMONAS SOBRIA KLEBSIELLA PNEUMONIAE SUSCEPTIBILITIES PERFORMED ON PREVIOUS CULTURE WITHIN THE LAST 5 DAYS. Performed at [facility]    Report Status 02/03/2015 FINAL  Final  Urine culture     Status: None   Collection Time: 01/29/15  2:48 PM  Result Value Ref Range Status   Specimen Description URINE, CLEAN CATCH  Final   Special Requests NONE  Final   Culture   Final    NO GROWTH 1 DAY Performed at [facility]    Report Status 01/31/2015 FINAL  Final  MRSA PCR Screening     Status: None   Collection Time: 01/29/15  7:10 PM  Result Value Ref Range Status   MRSA by PCR NEGATIVE NEGATIVE Final    Comment:        The GeneXpert MRSA Assay (FDA approved for NASAL specimens only), is one component of a comprehensive MRSA colonization surveillance program. It is not intended to diagnose MRSA infection nor to guide or monitor treatment for MRSA infections.   C difficile quick scan w PCR reflex     Status: None   Collection Time: 01/29/15 10:45 PM  Result Value Ref Range Status   C Diff antigen NEGATIVE NEGATIVE Final   C Diff toxin NEGATIVE NEGATIVE Final   C Diff interpretation Negative for toxigenic C. difficile  Final  Culture, Urine     Status: None   Collection Time: 01/30/15  8:52 AM  Result Value Ref Range Status   Specimen Description URINE, CATHETERIZED  Final   Special Requests NONE  Final   Culture NO  GROWTH 1 DAY  Final   Report Status 01/31/2015 FINAL  Final  Culture, blood (routine x 2)     Status: None (Preliminary result)   Collection Time: 02/02/15 10:45 AM  Result Value Ref Range Status   Specimen Description BLOOD LEFT HAND  Final   Special Requests BOTTLES DRAWN AEROBIC ONLY 4CCS  Final   Culture NO GROWTH 3 DAYS  Final   Report Status PENDING  Incomplete     Studies:  Recent x-ray studies have been reviewed in detail by the Attending Physician  Scheduled Meds:  Scheduled Meds: . antiseptic oral rinse  7 mL Mouth Rinse BID  . cefTAZidime (FORTAZ)  IV  1 g Intravenous Q24H  . furosemide  80 mg Intravenous BID  . insulin aspart  0-9 Units Subcutaneous 6 times per day  . levalbuterol  1.25 mg Nebulization Once  . metoprolol tartrate  37.5 mg Oral BID  . potassium chloride  50 mEq Oral Once  . sertraline  25 mg Oral Once  . sertraline  75 mg Oral q morning - 10a    Time spent on care of this patient: 40 mins   Mattias Walmsley, Geraldo Docker , MD  [facility] Office  [phone] Pager [phone]  On-Call/Text Page:      [email or web address]      password [email or web address]  If 7PM-7AM, please contact night-coverage [email or web address] Password [email or web address] 02/05/2015, 8:51 PM   LOS: 7 days   Care during the described time interval was provided by me .  I have reviewed this patient's available data, including medical history, events of  note, physical examination, and all test results as part of my evaluation. I have personally reviewed and interpreted all radiology studies.   Dia Crawford, MD [phone] Pager

## 2015-02-06 DIAGNOSIS — J810 Acute pulmonary edema: Secondary | ICD-10-CM

## 2015-02-06 DIAGNOSIS — F411 Generalized anxiety disorder: Secondary | ICD-10-CM

## 2015-02-06 DIAGNOSIS — B961 Klebsiella pneumoniae [K. pneumoniae] as the cause of diseases classified elsewhere: Secondary | ICD-10-CM

## 2015-02-06 LAB — COMPREHENSIVE METABOLIC PANEL
ALT: 20 U/L (ref 14–54)
AST: 16 U/L (ref 15–41)
Albumin: 1.6 g/dL — ABNORMAL LOW (ref 3.5–5.0)
Alkaline Phosphatase: 200 U/L — ABNORMAL HIGH (ref 38–126)
Anion gap: 9 (ref 5–15)
BILIRUBIN TOTAL: 0.8 mg/dL (ref 0.3–1.2)
BUN: 34 mg/dL — AB (ref 6–20)
CO2: 25 mmol/L (ref 22–32)
Calcium: 7.1 mg/dL — ABNORMAL LOW (ref 8.9–10.3)
Chloride: 99 mmol/L — ABNORMAL LOW (ref 101–111)
Creatinine, Ser: 1.3 mg/dL — ABNORMAL HIGH (ref 0.44–1.00)
GFR, EST AFRICAN AMERICAN: 43 mL/min — AB (ref >60)
GFR, EST NON AFRICAN AMERICAN: 37 mL/min — AB (ref >60)
Glucose, Bld: 122 mg/dL — ABNORMAL HIGH (ref 65–99)
POTASSIUM: 2.8 mmol/L — AB (ref 3.5–5.1)
Sodium: 133 mmol/L — ABNORMAL LOW (ref 135–145)
TOTAL PROTEIN: 4.8 g/dL — AB (ref 6.5–8.1)

## 2015-02-06 LAB — CBC WITH DIFFERENTIAL/PLATELET
BASOS PCT: 1 %
Basophils Absolute: 0.2 10*3/uL — ABNORMAL HIGH (ref 0.0–0.1)
EOS PCT: 1 %
Eosinophils Absolute: 0.2 10*3/uL (ref 0.0–0.7)
HEMATOCRIT: 31.6 % — AB (ref 36.0–46.0)
Hemoglobin: 11.3 g/dL — ABNORMAL LOW (ref 12.0–15.0)
LYMPHS ABS: 1.1 10*3/uL (ref 0.7–4.0)
Lymphocytes Relative: 7 %
MCH: 27.2 pg (ref 26.0–34.0)
MCHC: 35.8 g/dL (ref 30.0–36.0)
MCV: 76.1 fL — AB (ref 78.0–100.0)
MONO ABS: 1.1 10*3/uL — AB (ref 0.1–1.0)
MONOS PCT: 7 %
NEUTROS ABS: 12.4 10*3/uL — AB (ref 1.7–7.7)
Neutrophils Relative %: 84 %
Platelets: 143 10*3/uL — ABNORMAL LOW (ref 150–400)
RBC: 4.15 MIL/uL (ref 3.87–5.11)
RDW: 16.3 % — AB (ref 11.5–15.5)
WBC: 15 10*3/uL — ABNORMAL HIGH (ref 4.0–10.5)

## 2015-02-06 LAB — GLUCOSE, CAPILLARY
GLUCOSE-CAPILLARY: 130 mg/dL — AB (ref 65–99)
Glucose-Capillary: 99 mg/dL (ref 65–99)
Glucose-Capillary: 134 mg/dL — ABNORMAL HIGH (ref 65–99)
Glucose-Capillary: 138 mg/dL — ABNORMAL HIGH (ref 65–99)

## 2015-02-06 LAB — HEMOGLOBIN A1C
HEMOGLOBIN A1C: 6 % — AB (ref 4.8–5.6)
Mean Plasma Glucose: 126 mg/dL

## 2015-02-06 LAB — MAGNESIUM: Magnesium: 1.8 mg/dL (ref 1.7–2.4)

## 2015-02-06 MED ORDER — POTASSIUM CHLORIDE CRYS ER 20 MEQ PO TBCR
40.0000 meq | EXTENDED_RELEASE_TABLET | Freq: Two times a day (BID) | ORAL | Status: AC
  Administered 2015-02-06 – 2015-02-07 (×3): 40 meq via ORAL
  Filled 2015-02-06 (×3): qty 2

## 2015-02-06 MED ORDER — INSULIN ASPART 100 UNIT/ML ~~LOC~~ SOLN
0.0000 [IU] | Freq: Three times a day (TID) | SUBCUTANEOUS | Status: DC
  Administered 2015-02-07: 1 [IU] via SUBCUTANEOUS
  Administered 2015-02-07: 0 [IU] via SUBCUTANEOUS

## 2015-02-06 NOTE — Progress Notes (Signed)
Physical Therapy Treatment Patient Details Name: Jenna [name] MRN: [id number] DOB: [birth year] Today's Date: 02/06/2015    History of Present Illness Patient is a 81 y/o female presents with diarrhea, malaise, weakness, renal failure, metabolic acidosis, hypotension and thrombocytopenia. Her blood culture grew out GNR, later identified as areomonas. cxr showing bilateral effusion and pulmonary vascular congestion.  On 02/03/15 patient transferred to SDU due to CP - elevated troponin now trending downward.  PMH of breast ca, osteoporosis, anxiety.     PT Comments    Since last PT visit, patient transferred to SDU.  Today patient's troponin is trending downward.  Her K+ is 2.8 - RN reports she has provided patient with potassium and PT can work with patient.  Patient was able to ambulate 12' with RW and min assist.  Very deconditioned, impacting mobility.  Agree with need for SNF for continued therapy at d/c.   Follow Up Recommendations  SNF;Supervision/Assistance - 24 hour     Equipment Recommendations  Rolling walker with 5" wheels    Recommendations for Other Services OT consult     Precautions / Restrictions Precautions Precautions: Fall Restrictions Weight Bearing Restrictions: No    Mobility  Bed Mobility Overal bed mobility: Needs Assistance Bed Mobility: Supine to Sit     Supine to sit: Mod assist     General bed mobility comments: Verbal cues for technique.  Assist to raise trunk to sitting.  Use of bed pad to assist with scooting to EOB.  Transfers Overall transfer level: Needs assistance Equipment used: Rolling walker (2 wheeled) Transfers: Sit to/from [facility] Sit to Stand: Min assist;+2 safety/equipment Stand pivot transfers: Min assist;+2 physical assistance;+2 safety/equipment       General transfer comment: Verbal cues for hand placement.  Patient required min assist to rise to standing and for balance.  Transferred to [facility] with RW  and min assist.  Ambulation/Gait Ambulation/Gait assistance: Min assist;+2 safety/equipment Ambulation Distance (Feet): 12 Feet Assistive device: Rolling walker (2 wheeled) Gait Pattern/deviations: Step-through pattern;Decreased step length - right;Decreased step length - left;Decreased stride length;Shuffle Gait velocity: decreased   General Gait Details: Verbal cues for safe use of RW.  Slow, unsteady gait.  O2 sats remained in 90's.  HR up to 116 with gait.   Stairs            Wheelchair Mobility    Modified Rankin (Stroke Patients Only)       Balance                                    Cognition Arousal/Alertness: Awake/alert Behavior During Therapy: WFL for tasks assessed/performed Overall Cognitive Status: Within Functional Limits for tasks assessed (Slow to respond verbally today.)                      Exercises      General Comments        Pertinent Vitals/Pain Pain Assessment: Faces Faces Pain Scale: Hurts a little bit Pain Location: Stomach - "grumbling".  Patient had diarrhea during session. Pain Descriptors / Indicators: Cramping Pain Intervention(s): Monitored during session    Home Living                      Prior Function            PT Goals (current goals can now be found in the care plan section)  Progress towards PT goals: Progressing toward goals    Frequency  Min 3X/week    PT Plan Current plan remains appropriate    Co-evaluation             End of Session Equipment Utilized During Treatment: Gait belt;Oxygen Activity Tolerance: Patient limited by fatigue Patient left: in chair;with call bell/phone within reach;with family/visitor present     Time: [id number] PT Time Calculation (min) (ACUTE ONLY): 18 min  Charges:  $Gait Training: 8-22 mins                    G Codes:      Despina Pole 03-07-2015, 12:30 PM Carita Pian. Sanjuana Kava, [place] Pager [phone]

## 2015-02-06 NOTE — Progress Notes (Signed)
Dr. Thereasa Solo text paged potassium results.

## 2015-02-06 NOTE — Progress Notes (Signed)
[place] TEAM 1 - Stepdown/ICU TEAM PROGRESS NOTE  Jenna [name] [id number] DOB: 1931/04/02 DOA: 01/29/2015 PCP: Vena Austria, MD  Admit HPI / Brief Narrative: 81 y.o.F Hx Anxiety, HTN, HLD, and R IDC Breast Ca in remission who was brought to the ER at [facility] because of persistent diarrhea over 4 days. Patient became very weak and was unable to walk. Patient was unable to eat because of nausea.   In the ER patient was found to be hypotensive. Lactic acid was elevated with creatinine markedly elevated from baseline with metabolic acidosis severe hypokalemia and hypomagnesemia.   HPI/Subjective: The patient is resting comfortably.  She denies nausea vomiting or abdominal pain.  She states she simply feels "weak all over."  Assessment/Plan:  Sepsis from intra-abdominal source - gastroenteritis/colitis/diarrhea  -Clinically sepsis has resolved  AEROMONAS SOBRIA / KLEBSIELLA PNEUMONIAE bacteremia  -Cipro and Flagyl stopped per ID - continue Fortaz to complete 10 days of tx as per ID - clinically much improved   Acute renal failure with metabolic acidosis  -due to sepsis w/ hypotension + ARB use -continues to improve  Acute Pulmonary edema / right pleural effusion -due to aggressive volume resuscitation in setting of severe sepsis / hypotension -admission weight 54.4 kg- current weight 65 kg down from peak of 70.6 kg during this admission -Lasix 80 mg BID x 6 doses then reassess - clinically responding quite nicely to diuresis  Bronchospasm -Resolved  Chest Pain / Elevated troponin -troponin 2.28-->0.98-->0.53  -given extent of elevation should consider eventual coronary eval, but presently not a candidate for cath given recovering renal fxn   HTN / Diastolic Dysfunction -BP currently well controlled   Abdominal Pain -Appears to have resolved as of today   Thrombocytopenia -due to gram neg rod bacteremia - improving - follow    Hypokalemia -cont to replace and follow trend   Hypomagnesemia -Magnesium improved w/ replacement - follow trend   Hyponatremia -stabilizing - follow w/ ongoing diuresis   Hyperglycemia -A1c 6.0 therefore not true DM   Hyperlipidemia  Anxiety -Ativan 1 mg BID PRN -Continue Zoloft to 75 mg daily   Code Status: FULL Family Communication: Spoke with daughter via telephone Disposition Plan: Remain in step down unit overnight - if remains stable and continues to improve at current rate would be a good candidate for transfer 12/6 - ultimate plan is for discharge to SNF for rehabilitation stay  Consultants: ID  Procedures: 11/29 TTE - EF= 65% to 70% - grade 1 diastolic dysfunction  Antibiotics: Cipro 11/27-11/28  Flagy 11/27 >> 11/28 Ceftaz 11/28 >>  DVT prophylaxis: SCDs  Objective: Blood pressure 129/73, pulse 100, temperature 97.9 F (36.6 C), temperature source Axillary, resp. rate 21, height 4\' 11"  (1.499 m), weight 65 kg (143 lb 4.8 oz), SpO2 97 %.  Intake/Output Summary (Last 24 hours) at 02/06/15 1424 Last data filed at 02/06/15 1102  Gross per 24 hour  Intake    320 ml  Output   5375 ml  Net  -5055 ml   Exam: General: No acute respiratory distress Lungs: Clear to auscultation bilaterally without wheezes or crackles Cardiovascular: Regular rate and rhythm without murmur gallop or rub normal S1 and S2 Abdomen: Nontender, nondistended, soft, bowel sounds positive, no rebound, no ascites, no appreciable mass Extremities: No significant cyanosis, clubbing, or edema bilateral lower extremities  Data Reviewed: Basic Metabolic Panel:  Recent Labs Lab 01/31/15 0306 02/01/15 0742 02/02/15 0330  02/03/15 1608 02/04/15 0448 02/04/15 1343 02/04/15 1902  02/05/15 0100 02/05/15 2000 02/06/15 0445  NA 134* 135 133*  < > 130* 132* 132* 133* 131* 134* 133*  K 4.1 3.3* 2.9*  < > 3.7 3.9 6.0* 3.9 3.6 3.1* 2.8*  CL 109 110 108  < > 103 105 105 105 103 98* 99*   CO2 11* 15* 17*  < > 15* 14* 14* 17* 18* 23 25  GLUCOSE 105* 107* 111*  < > 162* 178* 159* 174* 151* 167* 122*  BUN 46* 49* 46*  < > 40* 41* 46* 42* 42* 39* 34*  CREATININE 3.57* 3.13* 2.54*  < > 1.85* 1.69* 1.52* 1.48* 1.39* 1.36* 1.30*  CALCIUM 6.9* 7.5* 8.0*  < > 7.9* 7.9* 7.8* 7.8* 7.4* 7.8* 7.1*  MG 2.9* 2.6* 2.4  < > 1.9 1.7  --   --  1.6* 1.9 1.8  PHOS 2.3* 2.5 2.9  --   --   --   --   --   --   --   --   < > = values in this interval not displayed.  CBC:  Recent Labs Lab 02/02/15 0330 02/03/15 0232 02/04/15 0448 02/05/15 0100 02/05/15 1500 02/06/15 0445  WBC 15.9* 16.9* 26.8* 23.8* 22.3* 15.0*  NEUTROABS 13.8* 15.4* 24.4* 21.7*  --  12.4*  HGB 11.2* 11.0* 14.1 12.9 11.8* 11.3*  HCT 31.4* 31.5* 40.3 35.9* 34.0* 31.6*  MCV 76.4* 76.3* 76.0* 75.4* 76.7* 76.1*  PLT 93* 93* 123* 59* 136* 143*    Liver Function Tests:  Recent Labs Lab 02/02/15 0330 02/04/15 1343 02/04/15 1902 02/05/15 0100 02/06/15 0445  AST  --  QUANTITY NOT SUFFICIENT, UNABLE TO PERFORM TEST 27 25 16   ALT  --  QUANTITY NOT SUFFICIENT, UNABLE TO PERFORM TEST 28 27 20   ALKPHOS  --  QUANTITY NOT SUFFICIENT, UNABLE TO PERFORM TEST 259* 232* 200*  BILITOT  --  QUANTITY NOT SUFFICIENT, UNABLE TO PERFORM TEST 1.8* 1.9* 0.8  PROT  --  4.9* 5.0* 4.5* 4.8*  ALBUMIN 1.7* 1.8* 1.8* 1.7* 1.6*   Coags:  Recent Labs Lab 02/05/15 1500  INR 1.22    Recent Labs Lab 02/05/15 1500  APTT 26    Cardiac Enzymes:  Recent Labs Lab 02/03/15 2119 02/04/15 0448 02/04/15 1343 02/04/15 1902 02/05/15 0100  TROPONINI 1.85* 1.34* 0.98* 0.50* 0.53*    CBG:  Recent Labs Lab 02/06/15 0814 02/06/15 1150  GLUCAP 99 134*    Recent Results (from the past 240 hour(s))  Blood Culture (routine x 2)     Status: None   Collection Time: 01/29/15  2:45 PM  Result Value Ref Range Status   Specimen Description BLOOD LEFT ARM  Final   Special Requests   Final    BOTTLES DRAWN AEROBIC AND ANAEROBIC 5CC BOTH  BOTTLES   Culture  Setup Time   Final    GRAM NEGATIVE RODS IN BOTH AEROBIC AND ANAEROBIC BOTTLES CRITICAL RESULT CALLED TO, READ BACK BY AND VERIFIED WITH: L. FLOOD,RN AT 0730 ON [id number] BY Rhea Bleacher    Culture   Final    AEROMONAS SOBRIA KLEBSIELLA PNEUMONIAE Performed at [facility]    Report Status 02/03/2015 FINAL  Final   Organism ID, Bacteria AEROMONAS SOBRIA  Final   Organism ID, Bacteria KLEBSIELLA PNEUMONIAE  Final      Susceptibility   Aeromonas sobria - MIC*    CEFAZOLIN 8 SENSITIVE Sensitive     GENTAMICIN <=1 SENSITIVE Sensitive     CIPROFLOXACIN <=0.25 SENSITIVE Sensitive  IMIPENEM 8 INTERMEDIATE Intermediate     TRIMETH/SULFA <=20 SENSITIVE Sensitive     * AEROMONAS SOBRIA   Klebsiella pneumoniae - MIC*    AMPICILLIN 16 RESISTANT Resistant     CEFAZOLIN <=4 SENSITIVE Sensitive     CEFEPIME <=1 SENSITIVE Sensitive     CEFTAZIDIME <=1 SENSITIVE Sensitive     CEFTRIAXONE <=1 SENSITIVE Sensitive     CIPROFLOXACIN <=0.25 SENSITIVE Sensitive     GENTAMICIN <=1 SENSITIVE Sensitive     IMIPENEM <=0.25 SENSITIVE Sensitive     TRIMETH/SULFA <=20 SENSITIVE Sensitive     AMPICILLIN/SULBACTAM <=2 SENSITIVE Sensitive     PIP/TAZO <=4 SENSITIVE Sensitive     * KLEBSIELLA PNEUMONIAE  Blood Culture (routine x 2)     Status: None   Collection Time: 01/29/15  2:45 PM  Result Value Ref Range Status   Specimen Description BLOOD RIGHT ARM  Final   Special Requests   Final    BOTTLES DRAWN AEROBIC AND ANAEROBIC 5CC BOTH BOTTLES   Culture  Setup Time   Final    GRAM NEGATIVE RODS IN BOTH AEROBIC AND ANAEROBIC BOTTLES CRITICAL RESULT CALLED TO, READ BACK BY AND VERIFIED WITH: L. FLOOD,RN AT 0730 ON [id number] BY S. YARBROUGH    Culture   Final    AEROMONAS SOBRIA KLEBSIELLA PNEUMONIAE SUSCEPTIBILITIES PERFORMED ON PREVIOUS CULTURE WITHIN THE LAST 5 DAYS. Performed at [facility]    Report Status 02/03/2015 FINAL  Final  Urine culture     Status: None    Collection Time: 01/29/15  2:48 PM  Result Value Ref Range Status   Specimen Description URINE, CLEAN CATCH  Final   Special Requests NONE  Final   Culture   Final    NO GROWTH 1 DAY Performed at [facility]    Report Status 01/31/2015 FINAL  Final  MRSA PCR Screening     Status: None   Collection Time: 01/29/15  7:10 PM  Result Value Ref Range Status   MRSA by PCR NEGATIVE NEGATIVE Final    Comment:        The GeneXpert MRSA Assay (FDA approved for NASAL specimens only), is one component of a comprehensive MRSA colonization surveillance program. It is not intended to diagnose MRSA infection nor to guide or monitor treatment for MRSA infections.   C difficile quick scan w PCR reflex     Status: None   Collection Time: 01/29/15 10:45 PM  Result Value Ref Range Status   C Diff antigen NEGATIVE NEGATIVE Final   C Diff toxin NEGATIVE NEGATIVE Final   C Diff interpretation Negative for toxigenic C. difficile  Final  Culture, Urine     Status: None   Collection Time: 01/30/15  8:52 AM  Result Value Ref Range Status   Specimen Description URINE, CATHETERIZED  Final   Special Requests NONE  Final   Culture NO GROWTH 1 DAY  Final   Report Status 01/31/2015 FINAL  Final  Culture, blood (routine x 2)     Status: None (Preliminary result)   Collection Time: 02/02/15 10:45 AM  Result Value Ref Range Status   Specimen Description BLOOD LEFT HAND  Final   Special Requests BOTTLES DRAWN AEROBIC ONLY 4CCS  Final   Culture NO GROWTH 4 DAYS  Final   Report Status PENDING  Incomplete     Studies:   Recent x-ray studies have been reviewed in detail by the Attending Physician  Scheduled Meds:  Scheduled Meds: . antiseptic oral rinse  7 mL Mouth Rinse BID  . cefTAZidime (FORTAZ)  IV  1 g Intravenous Q24H  . furosemide  80 mg Intravenous BID  . insulin aspart  0-9 Units Subcutaneous 6 times per day  . levalbuterol  1.25 mg Nebulization Once  . metoprolol tartrate  37.5 mg  Oral BID  . potassium chloride  40 mEq Oral BID  . sertraline  25 mg Oral Once  . sertraline  75 mg Oral q morning - 10a    Time spent on care of this patient: 35 mins   Angeli Demilio T , MD   [facility] Office  [phone] Pager - Text Page per Shea Evans as per below:  On-Call/Text Page:      [email or web address]      password [email or web address]  If 7PM-7AM, please contact night-coverage [email or web address] Password [email or web address] 02/06/2015, 2:24 PM   LOS: 8 days

## 2015-02-06 NOTE — Progress Notes (Addendum)
ANTIBIOTIC CONSULT NOTE - FOLLOW UP  Pharmacy Consult for Ceftazidime Indication: aeromonas sobria bacteremia, intra-abdominal infxn  Allergies  Allergen Reactions  . Iodine Itching and Rash    Iodine IV  . Sulfa Antibiotics Itching and Rash   Patient Measurements: Height: 4\' 11"  (149.9 cm) Weight: 143 lb 4.8 oz (65 kg) IBW/kg (Calculated) : 43.2 Vital Signs: Temp: 97.9 F (36.6 C) (12/05 1211) Temp Source: Axillary (12/05 1211) BP: 129/73 mmHg (12/05 1100) Pulse Rate: 100 (12/05 1100) Intake/Output from previous day: 12/04 0701 - 12/05 0700 In: 470 [P.O.:420; IV Piggyback:50] Out: [id number] [Urine:4425] Intake/Output from this shift: Total I/O In: 120 [P.O.:120] Out: 1425 [Urine:1425] Labs:  Recent Labs  02/05/15 0100 02/05/15 1500 02/05/15 2000 02/06/15 0445  WBC 23.8* 22.3*  --  15.0*  HGB 12.9 11.8*  --  11.3*  PLT 59* 136*  --  143*  CREATININE 1.39*  --  1.36* 1.30*   Estimated Creatinine Clearance: 27.8 mL/min (by C-G formula based on Cr of 1.3). No results for input(s): VANCOTROUGH, VANCOPEAK, VANCORANDOM, GENTTROUGH, GENTPEAK, GENTRANDOM, TOBRATROUGH, TOBRAPEAK, TOBRARND, AMIKACINPEAK, AMIKACINTROU, AMIKACIN in the last 72 hours.   Assessment: 81 year old female continuing on ceftaz for aeromonas sobria bacteremia and r/o intra-abdominal infection. WBC trend down to 15. Afebrile. LA trend down to 2. PCT trend down to 8.97. D#9 of 10 for ceftaz. CrCl~28.  Cipro 11/27 >> 11/28 Fortaz 11/28 >> (12/6) Flagyl 11/27 >> 12/01  11/27 Urine>> neg 11/27 Blood >> 4/4 aeromonas sobria (pan-sensitive except intermediate to imipenem); klebsiella (pan-sensitive except amp)  11/27 Cdiff >> negative 11/27 GI pathogen >> negative 11/28 urine >> neg 12/1 blood x 2 >> ngtd  Goal of Therapy:  clinical resolution of infection  Plan:  Continue Ceftazidime 1g IV every 24 hours (for 10 days total) Monitor renal function, clinical status, and culture results.   Elicia Lamp, PharmD, BCPS Clinical Pharmacist Pager [phone] 02/06/2015 12:53 PM

## 2015-02-06 NOTE — Progress Notes (Signed)
Pt assisted back to bed

## 2015-02-07 LAB — COMPREHENSIVE METABOLIC PANEL
ALT: 17 U/L (ref 14–54)
AST: 22 U/L (ref 15–41)
Albumin: 1.7 g/dL — ABNORMAL LOW (ref 3.5–5.0)
Alkaline Phosphatase: 234 U/L — ABNORMAL HIGH (ref 38–126)
Anion gap: 10 (ref 5–15)
BUN: 31 mg/dL — ABNORMAL HIGH (ref 6–20)
CHLORIDE: 100 mmol/L — AB (ref 101–111)
CO2: 24 mmol/L (ref 22–32)
Calcium: 7.3 mg/dL — ABNORMAL LOW (ref 8.9–10.3)
Creatinine, Ser: 1.09 mg/dL — ABNORMAL HIGH (ref 0.44–1.00)
GFR, EST AFRICAN AMERICAN: 54 mL/min — AB (ref >60)
GFR, EST NON AFRICAN AMERICAN: 46 mL/min — AB (ref >60)
Glucose, Bld: 127 mg/dL — ABNORMAL HIGH (ref 65–99)
POTASSIUM: 3.7 mmol/L (ref 3.5–5.1)
SODIUM: 134 mmol/L — AB (ref 135–145)
Total Bilirubin: 0.9 mg/dL (ref 0.3–1.2)
Total Protein: 4.8 g/dL — ABNORMAL LOW (ref 6.5–8.1)

## 2015-02-07 LAB — CULTURE, BLOOD (ROUTINE X 2): CULTURE: NO GROWTH

## 2015-02-07 LAB — CBC WITH DIFFERENTIAL/PLATELET
Basophils Absolute: 0.1 10*3/uL (ref 0.0–0.1)
Basophils Relative: 1 %
Eosinophils Absolute: 0.1 10*3/uL (ref 0.0–0.7)
Eosinophils Relative: 1 %
HCT: 32.8 % — ABNORMAL LOW (ref 36.0–46.0)
Hemoglobin: 11 g/dL — ABNORMAL LOW (ref 12.0–15.0)
Lymphocytes Relative: 7 %
Lymphs Abs: 0.9 10*3/uL (ref 0.7–4.0)
MCH: 26.4 pg (ref 26.0–34.0)
MCHC: 33.5 g/dL (ref 30.0–36.0)
MCV: 78.7 fL (ref 78.0–100.0)
Monocytes Absolute: 0.8 10*3/uL (ref 0.1–1.0)
Monocytes Relative: 6 %
Neutro Abs: 11.1 10*3/uL — ABNORMAL HIGH (ref 1.7–7.7)
Neutrophils Relative %: 85 %
Platelets: 162 10*3/uL (ref 150–400)
RBC: 4.17 MIL/uL (ref 3.87–5.11)
RDW: 16.7 % — ABNORMAL HIGH (ref 11.5–15.5)
WBC: 13 10*3/uL — ABNORMAL HIGH (ref 4.0–10.5)

## 2015-02-07 LAB — GLUCOSE, CAPILLARY
GLUCOSE-CAPILLARY: 143 mg/dL — AB (ref 65–99)
Glucose-Capillary: 98 mg/dL (ref 65–99)
Glucose-Capillary: 164 mg/dL — ABNORMAL HIGH (ref 65–99)

## 2015-02-07 LAB — PATHOLOGIST SMEAR REVIEW

## 2015-02-07 LAB — MAGNESIUM: Magnesium: 1.5 mg/dL — ABNORMAL LOW (ref 1.7–2.4)

## 2015-02-07 LAB — TROPONIN I: TROPONIN I: 0.13 ng/mL — AB (ref <0.031)

## 2015-02-07 MED ORDER — DEXTROSE 5 % IV SOLN
1.0000 g | Freq: Two times a day (BID) | INTRAVENOUS | Status: AC
  Administered 2015-02-07 – 2015-02-08 (×3): 1 g via INTRAVENOUS
  Filled 2015-02-07 (×5): qty 1

## 2015-02-07 MED ORDER — LEVOFLOXACIN 500 MG PO TABS
250.0000 mg | ORAL_TABLET | Freq: Every day | ORAL | Status: DC
  Filled 2015-02-07: qty 1

## 2015-02-07 MED ORDER — MAGNESIUM SULFATE 2 GM/50ML IV SOLN
2.0000 g | Freq: Once | INTRAVENOUS | Status: AC
  Administered 2015-02-07: 2 g via INTRAVENOUS
  Filled 2015-02-07: qty 50

## 2015-02-07 MED ORDER — LEVOFLOXACIN 500 MG PO TABS
500.0000 mg | ORAL_TABLET | Freq: Once | ORAL | Status: AC
  Administered 2015-02-09: 500 mg via ORAL
  Filled 2015-02-07: qty 1

## 2015-02-07 NOTE — Progress Notes (Signed)
Report called to [place], Therapist, sports for [employer] 16.

## 2015-02-07 NOTE — Progress Notes (Signed)
Both unit glucometers are malfunctioning.  Unit Secretary took both to lab to see if lab personnel can repair them.  Unable to obtain fingerstick glucose.

## 2015-02-07 NOTE — Progress Notes (Signed)
ANTIBIOTIC CONSULT NOTE - FOLLOW UP  Pharmacy Consult:  Levaquin Indication:  Aeromonas sobria and Kleb pneumoniae bacteremia  Allergies  Allergen Reactions  . Iodine Itching and Rash    Iodine IV  . Sulfa Antibiotics Itching and Rash    Patient Measurements: Height: 4\' 11"  (149.9 cm) Weight: 136 lb 3.9 oz (61.8 kg) IBW/kg (Calculated) : 43.2  Vital Signs: Temp: 98.4 F (36.9 C) (12/06 1213) Temp Source: Oral (12/06 1213) BP: 131/83 mmHg (12/06 1213) Intake/Output from previous day: 12/05 0701 - 12/06 0700 In: 600 [P.O.:600] Out: 4525 [Urine:4525] Intake/Output from this shift: Total I/O In: 360 [P.O.:360] Out: 1575 [Urine:1575]  Labs:  Recent Labs  02/05/15 1500 02/05/15 2000 02/06/15 0445 02/07/15 0448  WBC 22.3*  --  15.0* 13.0*  HGB 11.8*  --  11.3* 11.0*  PLT 136*  --  143* 162  CREATININE  --  1.36* 1.30* 1.09*   Estimated Creatinine Clearance: 32.3 mL/min (by C-G formula based on Cr of 1.09). No results for input(s): VANCOTROUGH, VANCOPEAK, VANCORANDOM, GENTTROUGH, GENTPEAK, GENTRANDOM, TOBRATROUGH, TOBRAPEAK, TOBRARND, AMIKACINPEAK, AMIKACINTROU, AMIKACIN in the last 72 hours.   Microbiology: Recent Results (from the past 720 hour(s))  Blood Culture (routine x 2)     Status: None   Collection Time: 01/29/15  2:45 PM  Result Value Ref Range Status   Specimen Description BLOOD LEFT ARM  Final   Special Requests   Final    BOTTLES DRAWN AEROBIC AND ANAEROBIC 5CC BOTH BOTTLES   Culture  Setup Time   Final    GRAM NEGATIVE RODS IN BOTH AEROBIC AND ANAEROBIC BOTTLES CRITICAL RESULT CALLED TO, READ BACK BY AND VERIFIED WITH: L. FLOOD,RN AT 0730 ON [id number] BY Rhea Bleacher    Culture   Final    AEROMONAS SOBRIA KLEBSIELLA PNEUMONIAE Performed at [facility]    Report Status 02/03/2015 FINAL  Final   Organism ID, Bacteria AEROMONAS SOBRIA  Final   Organism ID, Bacteria KLEBSIELLA PNEUMONIAE  Final      Susceptibility   Aeromonas sobria - MIC*     CEFAZOLIN 8 SENSITIVE Sensitive     GENTAMICIN <=1 SENSITIVE Sensitive     CIPROFLOXACIN <=0.25 SENSITIVE Sensitive     IMIPENEM 8 INTERMEDIATE Intermediate     TRIMETH/SULFA <=20 SENSITIVE Sensitive     * AEROMONAS SOBRIA   Klebsiella pneumoniae - MIC*    AMPICILLIN 16 RESISTANT Resistant     CEFAZOLIN <=4 SENSITIVE Sensitive     CEFEPIME <=1 SENSITIVE Sensitive     CEFTAZIDIME <=1 SENSITIVE Sensitive     CEFTRIAXONE <=1 SENSITIVE Sensitive     CIPROFLOXACIN <=0.25 SENSITIVE Sensitive     GENTAMICIN <=1 SENSITIVE Sensitive     IMIPENEM <=0.25 SENSITIVE Sensitive     TRIMETH/SULFA <=20 SENSITIVE Sensitive     AMPICILLIN/SULBACTAM <=2 SENSITIVE Sensitive     PIP/TAZO <=4 SENSITIVE Sensitive     * KLEBSIELLA PNEUMONIAE  Blood Culture (routine x 2)     Status: None   Collection Time: 01/29/15  2:45 PM  Result Value Ref Range Status   Specimen Description BLOOD RIGHT ARM  Final   Special Requests   Final    BOTTLES DRAWN AEROBIC AND ANAEROBIC 5CC BOTH BOTTLES   Culture  Setup Time   Final    GRAM NEGATIVE RODS IN BOTH AEROBIC AND ANAEROBIC BOTTLES CRITICAL RESULT CALLED TO, READ BACK BY AND VERIFIED WITH: L. FLOOD,RN AT 0730 ON [id number] BY Rhea Bleacher    Culture   Final  AEROMONAS SOBRIA KLEBSIELLA PNEUMONIAE SUSCEPTIBILITIES PERFORMED ON PREVIOUS CULTURE WITHIN THE LAST 5 DAYS. Performed at [facility]    Report Status 02/03/2015 FINAL  Final  Urine culture     Status: None   Collection Time: 01/29/15  2:48 PM  Result Value Ref Range Status   Specimen Description URINE, CLEAN CATCH  Final   Special Requests NONE  Final   Culture   Final    NO GROWTH 1 DAY Performed at [facility]    Report Status 01/31/2015 FINAL  Final  MRSA PCR Screening     Status: None   Collection Time: 01/29/15  7:10 PM  Result Value Ref Range Status   MRSA by PCR NEGATIVE NEGATIVE Final    Comment:        The GeneXpert MRSA Assay (FDA approved for NASAL specimens only),  is one component of a comprehensive MRSA colonization surveillance program. It is not intended to diagnose MRSA infection nor to guide or monitor treatment for MRSA infections.   C difficile quick scan w PCR reflex     Status: None   Collection Time: 01/29/15 10:45 PM  Result Value Ref Range Status   C Diff antigen NEGATIVE NEGATIVE Final   C Diff toxin NEGATIVE NEGATIVE Final   C Diff interpretation Negative for toxigenic C. difficile  Final  Culture, Urine     Status: None   Collection Time: 01/30/15  8:52 AM  Result Value Ref Range Status   Specimen Description URINE, CATHETERIZED  Final   Special Requests NONE  Final   Culture NO GROWTH 1 DAY  Final   Report Status 01/31/2015 FINAL  Final  Culture, blood (routine x 2)     Status: None   Collection Time: 02/02/15 10:45 AM  Result Value Ref Range Status   Specimen Description BLOOD LEFT HAND  Final   Special Requests BOTTLES DRAWN AEROBIC ONLY 4CCS  Final   Culture NO GROWTH 5 DAYS  Final   Report Status 02/07/2015 FINAL  Final      Assessment: 55 YOF with bacteremia to continue on Fortaz through 02/28/15, then start Levaquin PO to finish the treatment course.  Patient's renal function is returning to baseline.  Cipro 11/27 >> 11/28 Fortaz 11/28 >> (12/7) Flagyl 11/27 >> 12/01 LVQ 12/8 >>  11/27 Urine>> neg 11/27 Blood >> 4/4 aeromonas sobria (pan-sensitive except intermediate to imipenem); klebsiella (pan-sensitive except amp)  11/27 Cdiff >> negative 11/27 GI pathogen >> negative 11/28 urine >> neg 12/1 blood x 2 >> ngf   Goal of Therapy:  Resolution of infection   Plan:  - Change Fortaz to 1gm IV Q12H, continue through 02/08/15 - On 02/09/15, start Levaquin 500mg  PO x 1, then 250mg  PO daily through 02/13/15 - Monitor renal fxn, clinical progress    Shaheim Mahar D. Mina Marble, PharmD, BCPS Pager:  [phone] 02/07/2015, 4:35 PM

## 2015-02-07 NOTE — Progress Notes (Signed)
[facility] 1 - Stepdown/ICU TEAM Progress Note  Jenna [name] [id number] DOB: August 06, 1931 DOA: 01/29/2015 PCP: Vena Austria, MD  Admit HPI / Brief Narrative: 81 y.o.WF PMHx Anxiety HTN, HLD, Rt IDC Breast Ca in remission   Brought to the ER at [facility] because of patient having persistent diarrhea over the last 4 days. Patient states patient has been having multiple episodes of diarrhea to the point that patient became very weak and was unable to walk. Has been having some nausea denies any vomiting. Patient was unable to eat because of the nausea. Has been having cramping abdominal pain. Patient has not had any recent travel or sick contacts. Has had a TV dinner 6 days ago. In the ER patient was found to be hypotensive and was given 3 L fluid bolus. Lactic acid was elevated with creatinine markedly elevated from baseline with metabolic acidosis severe hypokalemia and hypomagnesemia. Patient has been admitted for sepsis from intra-abdominal source most likely gastroenteritis/colitis. On my exam patient's abdomen appears benign. Patient's blood pressure is still in the [id number] systolic. Patient is alert awake and following commands. Patient states her diarrhea was dark in color.   HPI/Subjective: 12/6 A/O 4, negative SOB, negative DOE (ambulating yesterday). States has been approved for [facility]. Currently feels weak,    Assessment/Plan: Sepsis from intra-abdominal source most likely gastroenteritis/colitis  -Was started on Cipro and Flagyl empirically. -Fecal lactoferrin pending -GI pathogen panel negative  Bacteremia positive AEROMONAS SOBRIA/ KLEBSIELLA PNEUMONIAE -Cipro and Flagyl stopped per ID -Continue Fortaz per ID for complete 10 day course. Stop Tressie Ellis on 12/7 -12/6 spoke with Dr. Johnnye Sima ID and can change over to PO Levofloxacin for 5 additional days of treatment.  Leukocytosis -Patient afebrile, negative bands or left shift most likely  secondary to Demargination, secondary to her hypertensive urgency -Appears WBC may have peaked at 26.8--23.8--> 13  Acute renal failure with metabolic acidosis  -probably from hypotension dehydration, ARB use -Continues to improve.  Acute Pulmonary edema/right pleural effusion -Strict I&O since admission -320ml -Daily weight admission weight= 54.4 kg               12/6 bed weight= 61.8 kg -D/Ced all IV fluids; fluid overload resolving  -Lasix 80 mg BID x 6 doses; will have completed all doses today.  Chest Pain/Elevated troponin -12/2 EKG compared to 11/27 EKG; RBBB resolved, V4-V6 9 essentially normal T-wave -Trend troponin;; most likely demand ischemia -12/3 initial troponin yesterday 2.28-->0.98-->0.53--> 0.13 -Pain resolved  HTN/Diastolic Dysfunction -Hydralazine 10 mg q 4hr PRN SBP> 165 or DBP> 100 -Continue Metoprolol 37.5 mg BID -Lasix 80 mg BID x 6 doses -Continue Hold ACEI or ARB secondary to acute renal failure  Abdominal Pain -KUB; nondiagnostic  -Resolved  Thrombocytopenia probably from infectious causes.  -Resolved   Hypokalemia -Potassium goal> 4  Hypomagnesemia -Magnesium goal> 2 -Magnesium IV 2 gm  Hyponatremia -Slowly resolving.   Hyperglycemia -Sensitive SSI -12/4 Hemoglobin A1c= 6  Hyperlipidemia  - Lipid panel pending  Anxiety -Ativan 1 mg BID PRN -Continue Zoloft to 75 mg daily      Code Status: FULL Family Communication: Daughter present at time of exam Disposition Plan: [facility]    Consultants: Precious Gilding ID   Procedure/Significant Events: 11/29 echocardiogram;- LVEF= 65% to 70%. -(grade 1 diastolic dysfunction). 12/2 KUB;No acute abdominal or pelvic abnormality. Negative SBO  -Stable left basilar pleural fluid and probable atelectasis. 12/30 PCXR;Cardiomegaly with increasing interstitial edema and effusions compatible with congestive  heart failure. -. Bilateral airspace disease, left greater than  right    Culture 11/27 blood left/right arm positive AEROMONAS SOBRIA/ KLEBSIELLA PNEUMONIAE 11/27 urine negative final 11/27 urine negative final 11/27 C diff Ag and toxin negative 11/27 GI pathogen panel negative 12/1 blood left hand NGTD 12/3 GI pathogen panel pending   Antibiotics: Cipro 11/27-11/28  Flagy 11/27 >> 11/28 Ceftaz 11/28 >> 12/6 Ciprofloxacin 12/6>>  DVT prophylaxis: SCD   Devices    LINES / TUBES:      Continuous Infusions:    Objective: VITAL SIGNS: Temp: 98.4 F (36.9 C) (12/06 1213) Temp Source: Oral (12/06 1213) BP: 131/83 mmHg (12/06 1213) SPO2; FIO2:   Intake/Output Summary (Last 24 hours) at 02/07/15 1620 Last data filed at 02/07/15 1414  Gross per 24 hour  Intake    840 ml  Output   4250 ml  Net  -3410 ml     Exam: General: A/O 4, negative respiratory distress now on room air  Eyes: Negative headache, negative scleral hemorrhage ENT: Negative Runny nose, negative ear pain, negative gingival bleeding, Neck:  Negative scars, masses, torticollis, lymphadenopathy, JVD Lungs: clear to auscultation bilateral, negative crackles/wheezes.   Cardiovascular:  Regular rhythm and rate without murmur gallop or rub normal S1 and S2 Abdomen:  Negative abdominal pain palpation, nondistended, positive soft, bowel sounds, no rebound, no ascites, no appreciable mass Extremities: No significant cyanosis, clubbing, or edema bilateral lower extremities Psychiatric:  Negative depression, negative anxiety, negative fatigue, negative mania  Neurologic:  Cranial nerves II through XII intact, tongue/uvula midline, all extremities muscle strength 5/5, sensation intact throughout, negative dysarthria, negative expressive aphasia, negative receptive aphasia.   Data Reviewed: Basic Metabolic Panel:  Recent Labs Lab 02/01/15 0742 02/02/15 0330  02/04/15 0448  02/04/15 1902 02/05/15 0100 02/05/15 2000 02/06/15 0445 02/07/15 0448  NA 135 133*   < > 132*  < > 133* 131* 134* 133* 134*  K 3.3* 2.9*  < > 3.9  < > 3.9 3.6 3.1* 2.8* 3.7  CL 110 108  < > 105  < > 105 103 98* 99* 100*  CO2 15* 17*  < > 14*  < > 17* 18* 23 25 24   GLUCOSE 107* 111*  < > 178*  < > 174* 151* 167* 122* 127*  BUN 49* 46*  < > 41*  < > 42* 42* 39* 34* 31*  CREATININE 3.13* 2.54*  < > 1.69*  < > 1.48* 1.39* 1.36* 1.30* 1.09*  CALCIUM 7.5* 8.0*  < > 7.9*  < > 7.8* 7.4* 7.8* 7.1* 7.3*  MG 2.6* 2.4  < > 1.7  --   --  1.6* 1.9 1.8 1.5*  PHOS 2.5 2.9  --   --   --   --   --   --   --   --   < > = values in this interval not displayed. Liver Function Tests:  Recent Labs Lab 02/04/15 1343 02/04/15 1902 02/05/15 0100 02/06/15 0445 02/07/15 0448  AST QUANTITY NOT SUFFICIENT, UNABLE TO PERFORM TEST 27 25 16 22   ALT QUANTITY NOT SUFFICIENT, UNABLE TO PERFORM TEST 28 27 20 17   ALKPHOS QUANTITY NOT SUFFICIENT, UNABLE TO PERFORM TEST 259* 232* 200* 234*  BILITOT QUANTITY NOT SUFFICIENT, UNABLE TO PERFORM TEST 1.8* 1.9* 0.8 0.9  PROT 4.9* 5.0* 4.5* 4.8* 4.8*  ALBUMIN 1.8* 1.8* 1.7* 1.6* 1.7*   No results for input(s): LIPASE, AMYLASE in the last 168 hours. No results for input(s): AMMONIA in the  last 168 hours. CBC:  Recent Labs Lab 02/03/15 0232 02/04/15 0448 02/05/15 0100 02/05/15 1500 02/06/15 0445 02/07/15 0448  WBC 16.9* 26.8* 23.8* 22.3* 15.0* 13.0*  NEUTROABS 15.4* 24.4* 21.7*  --  12.4* 11.1*  HGB 11.0* 14.1 12.9 11.8* 11.3* 11.0*  HCT 31.5* 40.3 35.9* 34.0* 31.6* 32.8*  MCV 76.3* 76.0* 75.4* 76.7* 76.1* 78.7  PLT 93* 123* 59* 136* 143* 162   Cardiac Enzymes:  Recent Labs Lab 02/04/15 0448 02/04/15 1343 02/04/15 1902 02/05/15 0100 02/07/15 0448  TROPONINI 1.34* 0.98* 0.50* 0.53* 0.13*   BNP (last 3 results) No results for input(s): BNP in the last 8760 hours.  ProBNP (last 3 results) No results for input(s): PROBNP in the last 8760 hours.  CBG:  Recent Labs Lab 02/06/15 0814 02/06/15 1150 02/06/15 1520 02/06/15 2133  02/07/15 1223  GLUCAP 99 134* 138* 130* 98    Recent Results (from the past 240 hour(s))  Blood Culture (routine x 2)     Status: None   Collection Time: 01/29/15  2:45 PM  Result Value Ref Range Status   Specimen Description BLOOD LEFT ARM  Final   Special Requests   Final    BOTTLES DRAWN AEROBIC AND ANAEROBIC 5CC BOTH BOTTLES   Culture  Setup Time   Final    GRAM NEGATIVE RODS IN BOTH AEROBIC AND ANAEROBIC BOTTLES CRITICAL RESULT CALLED TO, READ BACK BY AND VERIFIED WITH: L. FLOOD,RN AT 0730 ON [id number] BY Rhea Bleacher    Culture   Final    AEROMONAS SOBRIA KLEBSIELLA PNEUMONIAE Performed at [facility]    Report Status 02/03/2015 FINAL  Final   Organism ID, Bacteria AEROMONAS SOBRIA  Final   Organism ID, Bacteria KLEBSIELLA PNEUMONIAE  Final      Susceptibility   Aeromonas sobria - MIC*    CEFAZOLIN 8 SENSITIVE Sensitive     GENTAMICIN <=1 SENSITIVE Sensitive     CIPROFLOXACIN <=0.25 SENSITIVE Sensitive     IMIPENEM 8 INTERMEDIATE Intermediate     TRIMETH/SULFA <=20 SENSITIVE Sensitive     * AEROMONAS SOBRIA   Klebsiella pneumoniae - MIC*    AMPICILLIN 16 RESISTANT Resistant     CEFAZOLIN <=4 SENSITIVE Sensitive     CEFEPIME <=1 SENSITIVE Sensitive     CEFTAZIDIME <=1 SENSITIVE Sensitive     CEFTRIAXONE <=1 SENSITIVE Sensitive     CIPROFLOXACIN <=0.25 SENSITIVE Sensitive     GENTAMICIN <=1 SENSITIVE Sensitive     IMIPENEM <=0.25 SENSITIVE Sensitive     TRIMETH/SULFA <=20 SENSITIVE Sensitive     AMPICILLIN/SULBACTAM <=2 SENSITIVE Sensitive     PIP/TAZO <=4 SENSITIVE Sensitive     * KLEBSIELLA PNEUMONIAE  Blood Culture (routine x 2)     Status: None   Collection Time: 01/29/15  2:45 PM  Result Value Ref Range Status   Specimen Description BLOOD RIGHT ARM  Final   Special Requests   Final    BOTTLES DRAWN AEROBIC AND ANAEROBIC 5CC BOTH BOTTLES   Culture  Setup Time   Final    GRAM NEGATIVE RODS IN BOTH AEROBIC AND ANAEROBIC BOTTLES CRITICAL RESULT CALLED  TO, READ BACK BY AND VERIFIED WITH: L. FLOOD,RN AT 0730 ON [id number] BY S. YARBROUGH    Culture   Final    AEROMONAS SOBRIA KLEBSIELLA PNEUMONIAE SUSCEPTIBILITIES PERFORMED ON PREVIOUS CULTURE WITHIN THE LAST 5 DAYS. Performed at [facility]    Report Status 02/03/2015 FINAL  Final  Urine culture     Status: None  Collection Time: 01/29/15  2:48 PM  Result Value Ref Range Status   Specimen Description URINE, CLEAN CATCH  Final   Special Requests NONE  Final   Culture   Final    NO GROWTH 1 DAY Performed at [facility]    Report Status 01/31/2015 FINAL  Final  MRSA PCR Screening     Status: None   Collection Time: 01/29/15  7:10 PM  Result Value Ref Range Status   MRSA by PCR NEGATIVE NEGATIVE Final    Comment:        The GeneXpert MRSA Assay (FDA approved for NASAL specimens only), is one component of a comprehensive MRSA colonization surveillance program. It is not intended to diagnose MRSA infection nor to guide or monitor treatment for MRSA infections.   C difficile quick scan w PCR reflex     Status: None   Collection Time: 01/29/15 10:45 PM  Result Value Ref Range Status   C Diff antigen NEGATIVE NEGATIVE Final   C Diff toxin NEGATIVE NEGATIVE Final   C Diff interpretation Negative for toxigenic C. difficile  Final  Culture, Urine     Status: None   Collection Time: 01/30/15  8:52 AM  Result Value Ref Range Status   Specimen Description URINE, CATHETERIZED  Final   Special Requests NONE  Final   Culture NO GROWTH 1 DAY  Final   Report Status 01/31/2015 FINAL  Final  Culture, blood (routine x 2)     Status: None   Collection Time: 02/02/15 10:45 AM  Result Value Ref Range Status   Specimen Description BLOOD LEFT HAND  Final   Special Requests BOTTLES DRAWN AEROBIC ONLY 4CCS  Final   Culture NO GROWTH 5 DAYS  Final   Report Status 02/07/2015 FINAL  Final     Studies:  Recent x-ray studies have been reviewed in detail by the Attending  Physician  Scheduled Meds:  Scheduled Meds: . antiseptic oral rinse  7 mL Mouth Rinse BID  . cefTAZidime (FORTAZ)  IV  1 g Intravenous Q24H  . furosemide  80 mg Intravenous BID  . insulin aspart  0-9 Units Subcutaneous TID WC  . levalbuterol  1.25 mg Nebulization Once  . metoprolol tartrate  37.5 mg Oral BID  . sertraline  75 mg Oral q morning - 10a    Time spent on care of this patient: 40 mins   Linn Clavin, Geraldo Docker , MD  [facility] Office  [phone] Pager [phone]  On-Call/Text Page:      [email or web address]      password [email or web address]  If 7PM-7AM, please contact night-coverage [email or web address] Password [email or web address] 02/07/2015, 4:20 PM   LOS: 9 days   Care during the described time interval was provided by me .  I have reviewed this patient's available data, including medical history, events of note, physical examination, and all test results as part of my evaluation. I have personally reviewed and interpreted all radiology studies.   Dia Crawford, MD [phone] Pager

## 2015-02-08 DIAGNOSIS — A414 Sepsis due to anaerobes: Secondary | ICD-10-CM

## 2015-02-08 LAB — COMPREHENSIVE METABOLIC PANEL
ALBUMIN: 1.8 g/dL — AB (ref 3.5–5.0)
ALT: 17 U/L (ref 14–54)
ANION GAP: 8 (ref 5–15)
AST: 21 U/L (ref 15–41)
Alkaline Phosphatase: 269 U/L — ABNORMAL HIGH (ref 38–126)
BUN: 23 mg/dL — AB (ref 6–20)
CHLORIDE: 98 mmol/L — AB (ref 101–111)
CO2: 29 mmol/L (ref 22–32)
Calcium: 7.6 mg/dL — ABNORMAL LOW (ref 8.9–10.3)
Creatinine, Ser: 1.05 mg/dL — ABNORMAL HIGH (ref 0.44–1.00)
GFR calc Af Amer: 56 mL/min — ABNORMAL LOW (ref >60)
GFR, EST NON AFRICAN AMERICAN: 48 mL/min — AB (ref >60)
Glucose, Bld: 134 mg/dL — ABNORMAL HIGH (ref 65–99)
POTASSIUM: 3.5 mmol/L (ref 3.5–5.1)
Sodium: 135 mmol/L (ref 135–145)
Total Bilirubin: 0.6 mg/dL (ref 0.3–1.2)
Total Protein: 5.4 g/dL — ABNORMAL LOW (ref 6.5–8.1)

## 2015-02-08 LAB — GI PATHOGEN PANEL BY PCR, STOOL

## 2015-02-08 LAB — MAGNESIUM: Magnesium: 1.7 mg/dL (ref 1.7–2.4)

## 2015-02-08 LAB — CBC WITH DIFFERENTIAL/PLATELET
BASOS ABS: 0.1 10*3/uL (ref 0.0–0.1)
Basophils Relative: 1 %
Eosinophils Absolute: 0.1 10*3/uL (ref 0.0–0.7)
Eosinophils Relative: 1 %
HCT: 32.6 % — ABNORMAL LOW (ref 36.0–46.0)
Hemoglobin: 10.7 g/dL — ABNORMAL LOW (ref 12.0–15.0)
LYMPHS ABS: 0.9 10*3/uL (ref 0.7–4.0)
Lymphocytes Relative: 8 %
MCH: 26.2 pg (ref 26.0–34.0)
MCHC: 32.8 g/dL (ref 30.0–36.0)
MCV: 79.7 fL (ref 78.0–100.0)
MONO ABS: 0.7 10*3/uL (ref 0.1–1.0)
Monocytes Relative: 6 %
NEUTROS PCT: 84 %
Neutro Abs: 9.3 10*3/uL — ABNORMAL HIGH (ref 1.7–7.7)
PLATELETS: 182 10*3/uL (ref 150–400)
RBC: 4.09 MIL/uL (ref 3.87–5.11)
RDW: 16.6 % — AB (ref 11.5–15.5)
WBC: 11.1 10*3/uL — AB (ref 4.0–10.5)

## 2015-02-08 LAB — LIPID PANEL
CHOLESTEROL: 166 mg/dL (ref 0–200)
HDL: 25 mg/dL — ABNORMAL LOW (ref >40)
LDL Cholesterol: 109 mg/dL — ABNORMAL HIGH (ref 0–99)
TRIGLYCERIDES: 161 mg/dL — AB (ref <150)
Total CHOL/HDL Ratio: 6.6 RATIO
VLDL: 32 mg/dL (ref 0–40)

## 2015-02-08 LAB — GLUCOSE, CAPILLARY
GLUCOSE-CAPILLARY: 153 mg/dL — AB (ref 65–99)
Glucose-Capillary: 116 mg/dL — ABNORMAL HIGH (ref 65–99)
Glucose-Capillary: 117 mg/dL — ABNORMAL HIGH (ref 65–99)

## 2015-02-08 MED ORDER — POTASSIUM CHLORIDE CRYS ER 20 MEQ PO TBCR
40.0000 meq | EXTENDED_RELEASE_TABLET | Freq: Once | ORAL | Status: AC
  Administered 2015-02-08: 40 meq via ORAL
  Filled 2015-02-08: qty 2

## 2015-02-08 MED ORDER — ATORVASTATIN CALCIUM 40 MG PO TABS
40.0000 mg | ORAL_TABLET | Freq: Every day | ORAL | Status: DC
  Administered 2015-02-08 – 2015-02-09 (×2): 40 mg via ORAL
  Filled 2015-02-08 (×2): qty 1

## 2015-02-08 NOTE — Progress Notes (Signed)
Physical Therapy Treatment Patient Details Name: JAYSE HAMMOND MRN: [id number] DOB: 27-Sep-1931 Today's Date: 02/08/2015    History of Present Illness Patient is a 81 y/o female presents with diarrhea, malaise, weakness, renal failure, metabolic acidosis, hypotension and thrombocytopenia. Her blood culture grew out GNR, later identified as areomonas. cxr showing bilateral effusion and pulmonary vascular congestion.  On 02/03/15 patient transferred to SDU due to CP - elevated troponin now trending downward.  PMH of breast ca, osteoporosis, anxiety.     PT Comments    Making steady progress. Tolerated gait training up to 50 feet today on room air (SpO2 98%), using a rolling walker for support with min assist for navigation around objects. Pt very motivated and eager to return to independent lifestyle. Patient will continue to benefit from skilled physical therapy services to further improve independence with functional mobility.   Follow Up Recommendations  SNF;Supervision/Assistance - 24 hour     Equipment Recommendations  Rolling walker with 5" wheels    Recommendations for Other Services OT consult     Precautions / Restrictions Precautions Precautions: Fall Restrictions Weight Bearing Restrictions: No    Mobility  Bed Mobility Overal bed mobility: Needs Assistance Bed Mobility: Supine to Sit     Supine to sit: Min guard     General bed mobility comments: Min guard for safety. VC for technique. Requires extra time.  Transfers Overall transfer level: Needs assistance Equipment used: Rolling walker (2 wheeled) Transfers: Sit to/from Stand Sit to Stand: Min guard         General transfer comment: Close guard for safety. VC for hand placement. Good stability once upright with RW for support.  Ambulation/Gait Ambulation/Gait assistance: Min assist Ambulation Distance (Feet): 50 Feet Assistive device: Rolling walker (2 wheeled) Gait Pattern/deviations: Step-through  pattern;Decreased stride length Gait velocity: decreased Gait velocity interpretation: Below normal speed for age/gender General Gait Details: Educated on proper DME use with a rolling walker. Min assist intermittently for walker control. Some difficulty navigating and identifying objects in periphery. No overt loss of balance. HR 104, SpO2 98% on room air.    Stairs            Wheelchair Mobility    Modified Rankin (Stroke Patients Only)       Balance                                    Cognition Arousal/Alertness: Awake/alert Behavior During Therapy: WFL for tasks assessed/performed Overall Cognitive Status: Within Functional Limits for tasks assessed (Slow to respond verbally )                      Exercises General Exercises - Lower Extremity Ankle Circles/Pumps: AROM;Both;10 reps;Seated Quad Sets: Strengthening;Both;10 reps;Seated    General Comments        Pertinent Vitals/Pain Pain Assessment: No/denies pain Faces Pain Scale: Hurts a little bit Pain Intervention(s): Monitored during session    Home Living                      Prior Function            PT Goals (current goals can now be found in the care plan section) Acute Rehab PT Goals Patient Stated Goal: to return to independence PT Goal Formulation: With patient Time For Goal Achievement: 02/17/15 Potential to Achieve Goals: Good Progress towards PT goals: Progressing toward goals  Frequency  Min 3X/week    PT Plan Current plan remains appropriate    Co-evaluation             End of Session Equipment Utilized During Treatment: Gait belt Activity Tolerance: Patient tolerated treatment well Patient left: in chair;with call bell/phone within reach     Time: 1215-1239 PT Time Calculation (min) (ACUTE ONLY): 24 min  Charges:  $Gait Training: 8-22 mins $Therapeutic Activity: 8-22 mins                    G Codes:      Ellouise Newer 2015-02-11, 12:46 PM Camille Bal [place], [place]

## 2015-02-08 NOTE — Clinical Social Work Note (Signed)
CSW talked with Jolyne Loa, admissions director at [facility] and they will be able to accept patient on Thursday, 12/8.  CSW will continue to follow and facilitate discharge to [facility] after receiving insurance authorization on 12/8.   Adhvik Canady Givens, MSW, LCSW Licensed Clinical Social Worker [place] [phone]

## 2015-02-08 NOTE — Progress Notes (Signed)
TEAM 1 - Stepdown/ICU TEAM PROGRESS NOTE  Jenna [name] [id number] DOB: [birth year] DOA: 01/29/2015 PCP: Vena Austria, MD  Admit HPI / Brief Narrative: 81 y.o.F Hx Anxiety, HTN, HLD, and R IDC Breast Ca in remission who was brought to the ER at [place] because of persistent diarrhea over 4 days. Patient became very weak and was unable to walk. Patient was unable to eat because of nausea.   In the ER patient was found to be hypotensive. Lactic acid was elevated with creatinine markedly elevated from baseline with metabolic acidosis severe hypokalemia and hypomagnesemia.   HPI/Subjective: The patient is resting comfortably in bed.  She has no new complaints today.  She continues to feel "severe weakness in general."  She denies ongoing diarrhea, chest pain, shortness of breath, nausea vomiting, or headache.  Assessment/Plan:  Sepsis from intra-abdominal source - gastroenteritis/colitis/diarrhea  -Clinically sepsis has resolved  AEROMONAS SOBRIA / KLEBSIELLA PNEUMONIAE bacteremia  -Cipro and Flagyl stopped per ID - continue IV Fortaz through today - to change to oral levaquin in the AM to complete 5 additional days of levaquin tx   Acute renal failure with metabolic acidosis  -due to sepsis w/ hypotension + ARB use -continues to improve - recheck BMET in AM  Acute Pulmonary edema / right pleural effusion -due to aggressive volume resuscitation in setting of severe sepsis / hypotension -admission weight 54.4 kg- current weight 61.5 kg down from peak of 70.6 kg during this admission -now appears to be euvolemic - lasix stopped - follow clinically   Bronchospasm -Resolved  Chest Pain / Elevated troponin -troponin 2.28-->0.98-->0.53  -given extent of elevation should consider eventual coronary eval, but presently not a candidate for cath given recovering renal fxn  -outpt referral to Cardiology for risk stratification should be arranged    HTN / Diastolic Dysfunction -BP currently well controlled   Abdominal Pain -resolved   Thrombocytopenia -due to gram neg rod bacteremia - resolved    Hypokalemia -corrected - recheck in AM  Hypomagnesemia -Magnesium improved w/ replacement   Hyponatremia -resolved w/ diuresis   Hyperglycemia -A1c 6.0 therefore not true DM   Hyperlipidemia -resume tx now that oral intake has improved  Anxiety -Ativan 1 mg BID PRN -Continue Zoloft to 75 mg daily   Code Status: FULL Family Communication: no family present at time of exam today  Disposition Plan: d/c foley today and assure urinates - complete IV abx course today - begin oral abx in AM and plan for probable d/c to SNF 12/8  Consultants: ID  Procedures: 11/29 TTE - EF 65% to 70% - grade 1 diastolic dysfunction  Antibiotics: Cipro 11/27-11/28  Flagy 11/27 >> 11/28 Ceftaz 11/28 > 12/7 Levaquin 12/8 >  DVT prophylaxis: SCDs  Objective: Blood pressure 136/60, pulse 83, temperature 97.6 F (36.4 C), temperature source Oral, resp. rate 17, height 4\' 11"  (1.499 m), weight 61.5 kg (135 lb 9.3 oz), SpO2 98 %.  Intake/Output Summary (Last 24 hours) at 02/08/15 1119 Last data filed at 02/08/15 1032  Gross per 24 hour  Intake    240 ml  Output   4375 ml  Net  -4135 ml   Exam: General: No acute respiratory distress - alert and conversant  Lungs: Clear to auscultation bilaterally - no wheezes or crackles Cardiovascular: Regular rate and rhythm without murmur gallop or rub  Abdomen: Nontender, overweight, soft, bowel sounds positive, no rebound, no ascites, no appreciable mass Extremities: No significant cyanosis, clubbing, or edema  bilateral lower extremities  Data Reviewed: Basic Metabolic Panel:  Recent Labs Lab 02/02/15 0330  02/05/15 0100 02/05/15 2000 02/06/15 0445 02/07/15 0448 02/08/15 0416  NA 133*  < > 131* 134* 133* 134* 135  K 2.9*  < > 3.6 3.1* 2.8* 3.7 3.5  CL 108  < > 103 98* 99* 100* 98*    CO2 17*  < > 18* 23 25 24 29   GLUCOSE 111*  < > 151* 167* 122* 127* 134*  BUN 46*  < > 42* 39* 34* 31* 23*  CREATININE 2.54*  < > 1.39* 1.36* 1.30* 1.09* 1.05*  CALCIUM 8.0*  < > 7.4* 7.8* 7.1* 7.3* 7.6*  MG 2.4  < > 1.6* 1.9 1.8 1.5* 1.7  PHOS 2.9  --   --   --   --   --   --   < > = values in this interval not displayed.  CBC:  Recent Labs Lab 02/04/15 0448 02/05/15 0100 02/05/15 1500 02/06/15 0445 02/07/15 0448 02/08/15 0416  WBC 26.8* 23.8* 22.3* 15.0* 13.0* 11.1*  NEUTROABS 24.4* 21.7*  --  12.4* 11.1* 9.3*  HGB 14.1 12.9 11.8* 11.3* 11.0* 10.7*  HCT 40.3 35.9* 34.0* 31.6* 32.8* 32.6*  MCV 76.0* 75.4* 76.7* 76.1* 78.7 79.7  PLT 123* 59* 136* 143* 162 182    Liver Function Tests:  Recent Labs Lab 02/04/15 1902 02/05/15 0100 02/06/15 0445 02/07/15 0448 02/08/15 0416  AST 27 25 16 22 21   ALT 28 27 20 17 17   ALKPHOS 259* 232* 200* 234* 269*  BILITOT 1.8* 1.9* 0.8 0.9 0.6  PROT 5.0* 4.5* 4.8* 4.8* 5.4*  ALBUMIN 1.8* 1.7* 1.6* 1.7* 1.8*   Coags:  Recent Labs Lab 02/05/15 1500  INR 1.22    Recent Labs Lab 02/05/15 1500  APTT 26    Cardiac Enzymes:  Recent Labs Lab 02/04/15 0448 02/04/15 1343 02/04/15 1902 02/05/15 0100 02/07/15 0448  TROPONINI 1.34* 0.98* 0.50* 0.53* 0.13*    CBG:  Recent Labs Lab 02/06/15 2133 02/07/15 1223 02/07/15 1654 02/07/15 2201 02/08/15 0747  GLUCAP 130* 98 143* 164* 116*    Recent Results (from the past 240 hour(s))  Blood Culture (routine x 2)     Status: None   Collection Time: 01/29/15  2:45 PM  Result Value Ref Range Status   Specimen Description BLOOD LEFT ARM  Final   Special Requests   Final    BOTTLES DRAWN AEROBIC AND ANAEROBIC 5CC BOTH BOTTLES   Culture  Setup Time   Final    GRAM NEGATIVE RODS IN BOTH AEROBIC AND ANAEROBIC BOTTLES CRITICAL RESULT CALLED TO, READ BACK BY AND VERIFIED WITH: L. FLOOD,RN AT 0730 ON [id number] BY Rhea Bleacher    Culture   Final    AEROMONAS SOBRIA KLEBSIELLA  PNEUMONIAE Performed at [facility]    Report Status 02/03/2015 FINAL  Final   Organism ID, Bacteria AEROMONAS SOBRIA  Final   Organism ID, Bacteria KLEBSIELLA PNEUMONIAE  Final      Susceptibility   Aeromonas sobria - MIC*    CEFAZOLIN 8 SENSITIVE Sensitive     GENTAMICIN <=1 SENSITIVE Sensitive     CIPROFLOXACIN <=0.25 SENSITIVE Sensitive     IMIPENEM 8 INTERMEDIATE Intermediate     TRIMETH/SULFA <=20 SENSITIVE Sensitive     * AEROMONAS SOBRIA   Klebsiella pneumoniae - MIC*    AMPICILLIN 16 RESISTANT Resistant     CEFAZOLIN <=4 SENSITIVE Sensitive     CEFEPIME <=1 SENSITIVE Sensitive  CEFTAZIDIME <=1 SENSITIVE Sensitive     CEFTRIAXONE <=1 SENSITIVE Sensitive     CIPROFLOXACIN <=0.25 SENSITIVE Sensitive     GENTAMICIN <=1 SENSITIVE Sensitive     IMIPENEM <=0.25 SENSITIVE Sensitive     TRIMETH/SULFA <=20 SENSITIVE Sensitive     AMPICILLIN/SULBACTAM <=2 SENSITIVE Sensitive     PIP/TAZO <=4 SENSITIVE Sensitive     * KLEBSIELLA PNEUMONIAE  Blood Culture (routine x 2)     Status: None   Collection Time: 01/29/15  2:45 PM  Result Value Ref Range Status   Specimen Description BLOOD RIGHT ARM  Final   Special Requests   Final    BOTTLES DRAWN AEROBIC AND ANAEROBIC 5CC BOTH BOTTLES   Culture  Setup Time   Final    GRAM NEGATIVE RODS IN BOTH AEROBIC AND ANAEROBIC BOTTLES CRITICAL RESULT CALLED TO, READ BACK BY AND VERIFIED WITH: L. FLOOD,RN AT 0730 ON [id number] BY S. YARBROUGH    Culture   Final    AEROMONAS SOBRIA KLEBSIELLA PNEUMONIAE SUSCEPTIBILITIES PERFORMED ON PREVIOUS CULTURE WITHIN THE LAST 5 DAYS. Performed at [facility]    Report Status 02/03/2015 FINAL  Final  Urine culture     Status: None   Collection Time: 01/29/15  2:48 PM  Result Value Ref Range Status   Specimen Description URINE, CLEAN CATCH  Final   Special Requests NONE  Final   Culture   Final    NO GROWTH 1 DAY Performed at [facility]    Report Status 01/31/2015 FINAL   Final  MRSA PCR Screening     Status: None   Collection Time: 01/29/15  7:10 PM  Result Value Ref Range Status   MRSA by PCR NEGATIVE NEGATIVE Final    Comment:        The GeneXpert MRSA Assay (FDA approved for NASAL specimens only), is one component of a comprehensive MRSA colonization surveillance program. It is not intended to diagnose MRSA infection nor to guide or monitor treatment for MRSA infections.   C difficile quick scan w PCR reflex     Status: None   Collection Time: 01/29/15 10:45 PM  Result Value Ref Range Status   C Diff antigen NEGATIVE NEGATIVE Final   C Diff toxin NEGATIVE NEGATIVE Final   C Diff interpretation Negative for toxigenic C. difficile  Final  Culture, Urine     Status: None   Collection Time: 01/30/15  8:52 AM  Result Value Ref Range Status   Specimen Description URINE, CATHETERIZED  Final   Special Requests NONE  Final   Culture NO GROWTH 1 DAY  Final   Report Status 01/31/2015 FINAL  Final  Culture, blood (routine x 2)     Status: None   Collection Time: 02/02/15 10:45 AM  Result Value Ref Range Status   Specimen Description BLOOD LEFT HAND  Final   Special Requests BOTTLES DRAWN AEROBIC ONLY 4CCS  Final   Culture NO GROWTH 5 DAYS  Final   Report Status 02/07/2015 FINAL  Final     Studies:   Recent x-ray studies have been reviewed in detail by the Attending Physician  Scheduled Meds:  Scheduled Meds: . antiseptic oral rinse  7 mL Mouth Rinse BID  . cefTAZidime (FORTAZ)  IV  1 g Intravenous Q12H  . insulin aspart  0-9 Units Subcutaneous TID WC  . levalbuterol  1.25 mg Nebulization Once  . [START ON 02/10/2015] levofloxacin  250 mg Oral Daily  . [START ON 02/09/2015] levofloxacin  500 mg Oral Once  . metoprolol tartrate  37.5 mg Oral BID  . sertraline  75 mg Oral q morning - 10a    Time spent on care of this patient: 35 mins   Sharia Averitt T , MD   [facility] Office  [phone] Pager - Text Page per Shea Evans as per  below:  On-Call/Text Page:      [email or web address]      password [email or web address]  If 7PM-7AM, please contact night-coverage [email or web address] Password [email or web address] 02/08/2015, 11:19 AM   LOS: 10 days

## 2015-02-09 DIAGNOSIS — E871 Hypo-osmolality and hyponatremia: Secondary | ICD-10-CM | POA: Diagnosis present

## 2015-02-09 LAB — BASIC METABOLIC PANEL
ANION GAP: 7 (ref 5–15)
BUN: 16 mg/dL (ref 6–20)
CALCIUM: 7.8 mg/dL — AB (ref 8.9–10.3)
CO2: 28 mmol/L (ref 22–32)
Chloride: 101 mmol/L (ref 101–111)
Creatinine, Ser: 0.88 mg/dL (ref 0.44–1.00)
GFR, EST NON AFRICAN AMERICAN: 60 mL/min — AB (ref >60)
Glucose, Bld: 117 mg/dL — ABNORMAL HIGH (ref 65–99)
Potassium: 4 mmol/L (ref 3.5–5.1)
SODIUM: 136 mmol/L (ref 135–145)

## 2015-02-09 LAB — CBC
HCT: 32.2 % — ABNORMAL LOW (ref 36.0–46.0)
Hemoglobin: 10.7 g/dL — ABNORMAL LOW (ref 12.0–15.0)
MCH: 27 pg (ref 26.0–34.0)
MCHC: 33.2 g/dL (ref 30.0–36.0)
MCV: 81.3 fL (ref 78.0–100.0)
PLATELETS: 185 10*3/uL (ref 150–400)
RBC: 3.96 MIL/uL (ref 3.87–5.11)
RDW: 16.9 % — AB (ref 11.5–15.5)
WBC: 9.7 10*3/uL (ref 4.0–10.5)

## 2015-02-09 MED ORDER — LEVOFLOXACIN 250 MG PO TABS: 250.0000 mg | ORAL_TABLET | Freq: Every day | ORAL | Status: DC

## 2015-02-09 MED ORDER — SERTRALINE HCL 25 MG PO TABS
75.0000 mg | ORAL_TABLET | Freq: Every morning | ORAL | Status: DC
Start: 2015-02-09 — End: 2015-06-22

## 2015-02-09 MED ORDER — METOPROLOL TARTRATE 50 MG PO TABS
50.0000 mg | ORAL_TABLET | Freq: Two times a day (BID) | ORAL | Status: DC
  Administered 2015-02-09: 50 mg via ORAL
  Filled 2015-02-09: qty 1

## 2015-02-09 MED ORDER — METOPROLOL TARTRATE 50 MG PO TABS: 50.0000 mg | ORAL_TABLET | Freq: Two times a day (BID) | ORAL | Status: DC

## 2015-02-09 NOTE — Discharge Summary (Signed)
Physician Discharge Summary  Jenna [name] [id number] DOB: 01-Jul-1931 DOA: 01/29/2015  PCP: Vena Austria, MD  Admit date: 01/29/2015 Discharge date: 02/09/2015  Time spent: 35 minutes  Recommendations for Outpatient Follow-up:  Sepsis from intra-abdominal source most likely gastroenteritis/colitis -Was started on Cipro and Flagyl empirically. -GI pathogen panel negative  Bacteremia positive AEROMONAS SOBRIA/ KLEBSIELLA PNEUMONIAE -Cipro and Flagyl stopped per ID -Continue Fortaz per ID for complete 10 day course. Stop Tressie Ellis on 12/7 -12/6 spoke with Dr. Johnnye Sima ID and can change over to PO Levofloxacin for 5 additional days of treatment. -Follow-up with Dr. Leta Speller in 1-2 weeks for bacteremia, acute renal failure, acute pulmonary edema with right pleural effusion  Leukocytosis Resolved  Acute renal failure with metabolic acidosis  -Resolved  Acute Pulmonary edema/right pleural effusion -Strict I&O since admission -5.5 L -Daily weight admission weight= 54.4 kg 12/8 bed weight= 58.6 kg -Rehabilitation facility and PCP to monitor for future edema  Chest Pain/Elevated troponin -12/2 EKG compared to 11/27 EKG; RBBB resolved, V4-V6 9 essentially normal T-wave -Trend troponin;; most likely demand ischemia -12/3 initial troponin yesterday 2.28-->0.98-->0.53--> 0.13 -Pain resolved  HTN/Diastolic Dysfunction -Metoprolol 50 mg BID -Continue Hold ACEI or ARB secondary to acute renal failure -Patient aware that PCP may restart ACEI/ARB in the future dependent upon BP  Abdominal Pain -KUB; nondiagnostic  -Resolved  Thrombocytopenia probably from infectious causes.  -Resolved   Hypokalemia -Potassium goal> 4  Hypomagnesemia -Magnesium goal> 2  Hyponatremia -Slowly resolving.   Hyperglycemia -12/4 Hemoglobin A1c= 6 -Resolved  Hyperlipidemia - Lipid panel not within AHA guidelines. Will allow patient's PCP to  treat.  Anxiety -Continue Zoloft to 75 mg daily     Discharge Diagnoses:  Principal Problem:   Sepsis due to Klebsiella [facility]) Active Problems:   Sepsis ([place])   ARF (acute renal failure) (HCC)   Thrombocytopenia (HCC)   Hypokalemia   Elevated LFTs   Sepsis with multiple organ dysfunction (MOD) (HCC)   Bacteremia   Acute respiratory failure with hypoxia (HCC)   Bacteremia due to Klebsiella pneumoniae   HLD (hyperlipidemia)   Essential hypertension   Acute pulmonary edema (HCC)   Pleural effusion, right   Elevated troponin   Pain in the chest   Pulmonary edema   Diastolic dysfunction   Anxiety state   Acute renal failure (HCC)   Hyponatremia   Hypomagnesemia   Discharge Condition: Stable  Diet recommendation: Regular  Filed Weights   02/07/15 0500 02/07/15 2201 02/08/15 2106  Weight: 61.8 kg (136 lb 3.9 oz) 61.5 kg (135 lb 9.3 oz) 58.6 kg (129 lb 3 oz)    History of present illness:  81 y.o.WF PMHx Anxiety HTN, HLD, Rt IDC Breast Ca in remission   Brought to the ER at [facility] because of patient having persistent diarrhea over the last 4 days. Patient states patient has been having multiple episodes of diarrhea to the point that patient became very weak and was unable to walk. Has been having some nausea denies any vomiting. Patient was unable to eat because of the nausea. Has been having cramping abdominal pain. Patient has not had any recent travel or sick contacts. Has had a TV dinner 6 days ago. In the ER patient was found to be hypotensive and was given 3 L fluid bolus. Lactic acid was elevated with creatinine markedly elevated from baseline with metabolic acidosis severe hypokalemia and hypomagnesemia. Patient has been admitted for sepsis from intra-abdominal source most likely gastroenteritis/colitis. On my exam patient's  abdomen appears benign. Patient's blood pressure is still in the [id number] systolic. Patient is alert awake and following commands. Patient  states her diarrhea was dark in color. During his hospitalization patient has been treated for sepsis secondary to bacteremia positive AEROMONAS SOBRIA/ KLEBSIELLA PNEUMONIAE. Patient also was treated for acute pulmonary edema/right pleural effusion which occurred secondary to fluid resuscitation for sepsis protocol. After patient's sepsis was under control with appropriate antibiotics patient was diuresed and the pulmonary edema resolved. Patient now stable for rehabilitation.   Consultants: Precious Gilding ID   Procedure/Significant Events: 11/29 echocardiogram;- LVEF= 65% to 70%. -(grade 1 diastolic dysfunction). 12/2 KUB;No acute abdominal or pelvic abnormality. Negative SBO  -Stable left basilar pleural fluid and probable atelectasis. 12/30 PCXR;Cardiomegaly with increasing interstitial edema and effusions compatible with congestive heart failure. -. Bilateral airspace disease, left greater than right    Culture 11/27 blood left/right arm positive AEROMONAS SOBRIA/ KLEBSIELLA PNEUMONIAE 11/27 urine negative final 11/27 urine negative final 11/27 C diff Ag and toxin negative 11/27 GI pathogen panel negative 12/1 blood left hand NGTD 12/3 GI pathogen panel pending   Antibiotics: Cipro 11/27-11/28  Flagy 11/27 >> 11/28 Ceftaz 11/28 >> 12/7  Levofloxacin 12/8-12/12    Discharge Exam: Filed Vitals:   02/08/15 1032 02/08/15 2106 02/09/15 0558 02/09/15 0838  BP: 136/60 145/69 151/70 155/84  Pulse: 83 97 94 101  Temp: 97.6 F (36.4 C) 98.5 F (36.9 C) 98.1 F (36.7 C) 98.2 F (36.8 C)  TempSrc: Oral Oral Oral Oral  Resp: 17 18 16 17   Height:      Weight:  58.6 kg (129 lb 3 oz)    SpO2: 98% 94% 94% 96%    General: A/O 4, negative respiratory distress now on room air  Eyes: Negative headache, negative scleral hemorrhage ENT: Negative Runny nose, negative ear pain, negative gingival bleeding, Neck: Negative scars, masses, torticollis, lymphadenopathy,  JVD Lungs: clear to auscultation bilateral, negative crackles/wheezes.  Cardiovascular: Regular rhythm and rate without murmur gallop or rub normal S1 and S2    Discharge Instructions     Medication List    STOP taking these medications        atenolol 100 MG tablet  Commonly known as:  TENORMIN     ibuprofen 200 MG tablet  Commonly known as:  ADVIL,MOTRIN     losartan-hydrochlorothiazide 50-12.5 MG tablet  Commonly known as:  HYZAAR      TAKE these medications        atorvastatin 40 MG tablet  Commonly known as:  LIPITOR  Take 40 mg by mouth daily.     cholecalciferol 1000 UNITS tablet  Commonly known as:  VITAMIN D  Take 1,000 Units by mouth daily.     famotidine 20 MG tablet  Commonly known as:  PEPCID  Take 20 mg by mouth daily as needed for heartburn or indigestion.     levofloxacin 250 MG tablet  Commonly known as:  LEVAQUIN  Take 1 tablet (250 mg total) by mouth daily.  Start taking on:  02/10/2015     LORazepam 1 MG tablet  Commonly known as:  ATIVAN  Take 1 mg by mouth at bedtime as needed for sleep.     LUTEIN 20 PO  Take 1 tablet by mouth daily.     metoprolol 50 MG tablet  Commonly known as:  LOPRESSOR  Take 1 tablet (50 mg total) by mouth 2 (two) times daily.     sertraline 25 MG tablet  Commonly known  as:  ZOLOFT  Take 3 tablets (75 mg total) by mouth every morning.       Allergies  Allergen Reactions  . Iodine Itching and Rash    Iodine IV  . Sulfa Antibiotics Itching and Rash   Follow-up Information    Follow up with Vena Austria, MD. Schedule an appointment as soon as possible for a visit in 2 weeks.   Specialty:  Family Medicine   Why:  Follow-up with Dr. Leta Speller in 1-2 weeks for bacteremia, acute renal failure, acute pulmonary edema with right pleural effusion   Contact information:   [address] [place] [address] [phone]        The results of significant diagnostics from  this hospitalization (including imaging, microbiology, ancillary and laboratory) are listed below for reference.    Significant Diagnostic Studies: Ct Abdomen Pelvis Wo Contrast  01/30/2015  CLINICAL DATA:  81 year old female with nausea vomiting and diarrhea for 6 days. Sepsis. Personal history of breast cancer. EXAM: CT ABDOMEN AND PELVIS WITHOUT CONTRAST TECHNIQUE: Multidetector CT imaging of the abdomen and pelvis was performed following the standard protocol without IV contrast. COMPARISON:  CT chest abdomen and pelvis 11/21/2010. FINDINGS: Moderate scoliosis. There are some chronic appearing right costovertebral angle rib fractures which are new since 2012, but no acute No acute osseous abnormality identified. Small to moderate layering bilateral pleural effusions associated with bilateral lower lobe consolidation and/or atelectasis. No pericardial effusion. Postoperative changes to the right breast with multiple surgical clips noted. Foley catheter in place an the urinary bladder is decompressed. No pelvic free fluid. Negative noncontrast uterus and adnexa. Fluid in the distal colon. Diverticulosis with fluid in the left colon. Fluid and gas in the transverse and right colon. All of these segments are only mildly distended. The distal small bowel is decompressed. No dilated small bowel. There is moderate to severe distension of the stomach with gas and fluid. No enteric tube identified. The duodenum appears to be decompressed. No free air in the abdomen. No abdominal free fluid identified. Noncontrast liver, gallbladder, spleen, pancreas and adrenal glands appear stable and within normal limits. Aortoiliac calcified atherosclerosis noted. No acute renal findings are evident. No lymphadenopathy identified. There is some bilateral flank and abdominal wall stranding which could be related to anasarca, but is predominantly non dependent. IMPRESSION: 1. Bilateral pleural effusions with lower lobe  consolidation and atelectasis. 2. Moderate to severe gas and fluid distension of the stomach consider NG tube decompression. No dilated small bowel. Fluid throughout the colon compatible with diarrhea. 3. Nonspecific body wall stranding, predominantly nondependent so consider cellulitis in addition to anasarca. Electronically Signed   By: Genevie Ann M.D.   On: 01/30/2015 15:09   Dg Chest Port 1 View  02/04/2015  CLINICAL DATA:  Pulmonary edema. EXAM: PORTABLE CHEST - 1 VIEW COMPARISON:  One-view chest 02/03/2015 FINDINGS: Heart is enlarged. Bilateral edema has increased. Bilateral pleural effusions are worse, left greater than right. Associated airspace disease is present. Scoliosis of the thoracic spine is again noted. IMPRESSION: 1. Cardiomegaly with increasing interstitial edema and effusions compatible with congestive heart failure. 2. Bilateral airspace disease, left greater than right. This likely reflects atelectasis but infection is not excluded. 3. Scoliosis. Electronically Signed   By: San Morelle M.D.   On: 02/04/2015 18:09   Dg Chest Port 1 View  02/03/2015  CLINICAL DATA:  Pulmonary edema, sepsis. EXAM: PORTABLE CHEST 1 VIEW COMPARISON:  January 31, 2015. FINDINGS: Stable cardiomegaly and  central pulmonary vascular congestion. Stable bilateral pulmonary edema is noted compared to prior exam with associated pleural effusions. No pneumothorax is noted. Bony thorax is unremarkable. IMPRESSION: Stable cardiomegaly and bilateral pulmonary edema and pleural effusions consistent with congestive heart failure. Electronically Signed   By: Marijo Conception, M.D.   On: 02/03/2015 10:55   Dg Chest Port 1 View  01/31/2015  CLINICAL DATA:  [place] EXAM: PORTABLE CHEST 1 VIEW COMPARISON:  01/29/2015 FINDINGS: Hypoventilation.  Decreased lung volume with bibasilar atelectasis. Increase in pulmonary vascular congestion and bilateral effusions. Findings consistent with heart failure. IMPRESSION: Worsening  heart failure with edema and bilateral effusions Hypoventilation with bibasilar atelectasis. Electronically Signed   By: Franchot Gallo M.D.   On: 01/31/2015 07:28   Dg Chest Port 1 View  01/29/2015  CLINICAL DATA:  Hypotension hypothermia. History of breast cancer on the right. EXAM: PORTABLE CHEST 1 VIEW COMPARISON:  PA and lateral chest 11/09/2014 and 12/02/2014. FINDINGS: Streaky airspace disease is present in the right base appears and mildly increased compared to the prior study. The left lung is clear. Heart size is normal. No pneumothorax or pleural effusion. Marked thoracolumbar scoliosis is again seen. IMPRESSION: Some increase in streaky right basilar airspace opacities since the most recent examination could be due to atelectasis or infection. Electronically Signed   By: Inge Rise M.D.   On: 01/29/2015 15:45   Dg Abd Portable 1v  02/03/2015  CLINICAL DATA:  Clinical diagnosis of small bowel obstruction. EXAM: PORTABLE ABDOMEN - 1 VIEW COMPARISON:  Abdomen and pelvis CT dated 01/30/2015. FINDINGS: The previously seen gastric distention is no longer demonstrated. Gas in normal caliber small bowel and colon, including the rectum. Persistent increased density at the medial left lung base. Mild to moderate thoracolumbar scoliosis and mild degenerative changes. IMPRESSION: 1. No acute abdominal or pelvic abnormality. Specifically, no evidence of small bowel obstruction. 2. Stable left basilar pleural fluid and probable atelectasis. Electronically Signed   By: Claudie Revering M.D.   On: 02/03/2015 21:19    Microbiology: Recent Results (from the past 240 hour(s))  Culture, blood (routine x 2)     Status: None   Collection Time: 02/02/15 10:45 AM  Result Value Ref Range Status   Specimen Description BLOOD LEFT HAND  Final   Special Requests BOTTLES DRAWN AEROBIC ONLY 4CCS  Final   Culture NO GROWTH 5 DAYS  Final   Report Status 02/07/2015 FINAL  Final     Labs: Basic Metabolic  Panel:  Recent Labs Lab 02/05/15 0100 02/05/15 2000 02/06/15 0445 02/07/15 0448 02/08/15 0416 02/09/15 0700  NA 131* 134* 133* 134* 135 136  K 3.6 3.1* 2.8* 3.7 3.5 4.0  CL 103 98* 99* 100* 98* 101  CO2 18* 23 25 24 29 28   GLUCOSE 151* 167* 122* 127* 134* 117*  BUN 42* 39* 34* 31* 23* 16  CREATININE 1.39* 1.36* 1.30* 1.09* 1.05* 0.88  CALCIUM 7.4* 7.8* 7.1* 7.3* 7.6* 7.8*  MG 1.6* 1.9 1.8 1.5* 1.7  --    Liver Function Tests:  Recent Labs Lab 02/04/15 1902 02/05/15 0100 02/06/15 0445 02/07/15 0448 02/08/15 0416  AST 27 25 16 22 21   ALT 28 27 20 17 17   ALKPHOS 259* 232* 200* 234* 269*  BILITOT 1.8* 1.9* 0.8 0.9 0.6  PROT 5.0* 4.5* 4.8* 4.8* 5.4*  ALBUMIN 1.8* 1.7* 1.6* 1.7* 1.8*   No results for input(s): LIPASE, AMYLASE in the last 168 hours. No results for input(s): AMMONIA in the  last 168 hours. CBC:  Recent Labs Lab 02/04/15 0448 02/05/15 0100 02/05/15 1500 02/06/15 0445 02/07/15 0448 02/08/15 0416 02/09/15 0700  WBC 26.8* 23.8* 22.3* 15.0* 13.0* 11.1* 9.7  NEUTROABS 24.4* 21.7*  --  12.4* 11.1* 9.3*  --   HGB 14.1 12.9 11.8* 11.3* 11.0* 10.7* 10.7*  HCT 40.3 35.9* 34.0* 31.6* 32.8* 32.6* 32.2*  MCV 76.0* 75.4* 76.7* 76.1* 78.7 79.7 81.3  PLT 123* 59* 136* 143* 162 182 185   Cardiac Enzymes:  Recent Labs Lab 02/04/15 0448 02/04/15 1343 02/04/15 1902 02/05/15 0100 02/07/15 0448  TROPONINI 1.34* 0.98* 0.50* 0.53* 0.13*   BNP: BNP (last 3 results) No results for input(s): BNP in the last 8760 hours.  ProBNP (last 3 results) No results for input(s): PROBNP in the last 8760 hours.  CBG:  Recent Labs Lab 02/07/15 1654 02/07/15 2201 02/08/15 0747 02/08/15 1157 02/08/15 2102  GLUCAP 143* 164* 116* 117* 153*       Signed:  Dia Crawford, MD [facility] [phone] pager

## 2015-02-09 NOTE — Clinical Social Work Placement (Addendum)
   CLINICAL SOCIAL WORK PLACEMENT  NOTE 02/09/15 - DISCHARGED TO [facility]  Date:  02/09/2015  Patient Details  Name: Jenna [name] MRN: [id number] Date of Birth: 1931-03-31  Clinical Social Work is seeking post-discharge placement for this patient at the [place] level of care (*CSW will initial, date and re-position this form in  chart as items are completed):  Yes   Patient/family provided with [facility] Work Department's list of facilities offering this level of care within the geographic area requested by the patient (or if unable, by the patient's family).  Yes   Patient/family informed of their freedom to choose among providers that offer the needed level of care, that participate in Medicare, Medicaid or managed care program needed by the patient, have an available bed and are willing to accept the patient.  Yes   Patient/family informed of [facility]'s ownership interest in [facility] and [facility], as well as of the fact that they are under no obligation to receive care at these facilities.  PASRR submitted to EDS on 02/04/15     PASRR number received on 02/04/15     Existing PASRR number confirmed on       FL2 transmitted to all facilities in geographic area requested by pt/family on 02/04/15     FL2 transmitted to all facilities within larger geographic area on       Patient informed that his/her managed care company has contracts with or will negotiate with certain facilities, including the following:         02/08/15 - Patient/family informed of bed offers received.  Patient chooses bed at  [facility]     Physician recommends and patient chooses bed at      Patient to be transferred to  [facility] on  02/09/15.  Patient to be transferred to facility by  ambulance.   Patient family notified on  02/09/15 of transfer.  Name of family member notified:   Daughter, Ollen Bowl at the bedside.    PHYSICIAN    Additional Comment:  02/09/15 - Received authorization from insurance at 3:03 pm - [id number].   _______________________________________________ Sable Feil, LCSW 02/09/2015, 2:54 PM

## 2015-02-09 NOTE — Progress Notes (Signed)
Called report to [place] receiving Therapist, sports at [employer].

## 2015-02-09 NOTE — Care Management Important Message (Signed)
Important Message  Patient Details  Name: COLEEN LANDER MRN: [id number] Date of Birth: 1932-02-04   Medicare Important Message Given:  Yes    Ruben Mahler, Rory Percy, RN 02/09/2015, 2:38 PM

## 2015-02-10 ENCOUNTER — Encounter: Payer: Self-pay | Admitting: Adult Health

## 2015-02-13 ENCOUNTER — Non-Acute Institutional Stay (SKILLED_NURSING_FACILITY): Payer: PPO | Admitting: Internal Medicine

## 2015-02-13 DIAGNOSIS — R5381 Other malaise: Secondary | ICD-10-CM

## 2015-02-13 DIAGNOSIS — R195 Other fecal abnormalities: Secondary | ICD-10-CM | POA: Diagnosis not present

## 2015-02-13 DIAGNOSIS — N179 Acute kidney failure, unspecified: Secondary | ICD-10-CM

## 2015-02-13 DIAGNOSIS — K219 Gastro-esophageal reflux disease without esophagitis: Secondary | ICD-10-CM | POA: Diagnosis not present

## 2015-02-13 DIAGNOSIS — F418 Other specified anxiety disorders: Secondary | ICD-10-CM | POA: Diagnosis not present

## 2015-02-13 DIAGNOSIS — R7881 Bacteremia: Secondary | ICD-10-CM | POA: Diagnosis not present

## 2015-02-13 DIAGNOSIS — E46 Unspecified protein-calorie malnutrition: Secondary | ICD-10-CM | POA: Diagnosis not present

## 2015-02-13 DIAGNOSIS — E785 Hyperlipidemia, unspecified: Secondary | ICD-10-CM | POA: Diagnosis not present

## 2015-02-13 DIAGNOSIS — I10 Essential (primary) hypertension: Secondary | ICD-10-CM | POA: Diagnosis not present

## 2015-02-13 NOTE — Progress Notes (Signed)
Patient ID: Jenna [name], female   DOB: [birth year], 81 y.o.   MRN: [id number]     [facility]   PCP: Vena Austria, MD  Code Status: full code  Allergies  Allergen Reactions  . Iodine Itching and Rash    Iodine IV  . Sulfa Antibiotics Itching and Rash    Chief Complaint  Patient presents with  . New Admit To SNF     HPI:  81 y.o. patient is here for short term rehabilitation post hospital admission from 01/29/15-02/09/15 with sepsis, klebsiella bacteremia, acute renal failure and acute pulmonary edema with demand ischemia. She will complete her levaquin today. She has poor appetite and has loose stools at present. She feels that her energy is slowly returning. She gets tired easily. Her son is present at bedside. She denies any other concerns.   Review of Systems:  Constitutional: Negative for fever, chills, diaphoresis.  HENT: Negative for headache, congestion, nasal discharge, sore throat, difficulty swallowing.   Eyes: Negative for eye pain, blurred vision, double vision and discharge.  Respiratory: Negative for cough, shortness of breath and wheezing.   Cardiovascular: Negative for chest pain, palpitations, leg swelling.  Gastrointestinal: Positive for heartburn. Negative for nausea, vomiting, abdominal pain. Genitourinary: Negative for dysuria, flank pain.  Musculoskeletal: Negative for back pain, falls. Skin: Negative for itching, rash.  Neurological: Negative for dizziness, tingling, focal weakness Psychiatric/Behavioral: Negative for depression.   Past Medical History  Diagnosis Date  . Hypercholesteremia   . Anxiety   . Osteoporosis   . Breast cancer, IDC, Right, Stage I 11/07/2010  . Constipation    Past Surgical History  Procedure Laterality Date  . Breast lumpectomy w/ needle localization  11/26/2010    Right - Dr Margot Chimes  . Tumor removal  1960, 1970    benign tumor from bilateral breast  . Hemorrhoid surgery     . Tonsillectomy     Social History:   reports that she has never smoked. She has never used smokeless tobacco. She reports that she does not drink alcohol or use illicit drugs.  Family History  Problem Relation Age of Onset  . Cancer Mother     lung  . Heart disease Mother   . Heart disease Father   . Heart disease Brother   . Heart disease Paternal Uncle   . Heart disease Maternal Grandmother   . Stroke Maternal Grandfather   . Heart disease Paternal Grandfather     Medications:   Medication List       This list is accurate as of: 02/13/15  3:34 PM.  Always use your most recent med list.               atorvastatin 40 MG tablet  Commonly known as:  LIPITOR  Take 40 mg by mouth daily.     cholecalciferol 1000 UNITS tablet  Commonly known as:  VITAMIN D  Take 1,000 Units by mouth daily.     famotidine 20 MG tablet  Commonly known as:  PEPCID  Take 20 mg by mouth daily as needed for heartburn or indigestion.     levofloxacin 250 MG tablet  Commonly known as:  LEVAQUIN  Take 1 tablet (250 mg total) by mouth daily.     LORazepam 1 MG tablet  Commonly known as:  ATIVAN  Take 1 mg by mouth at bedtime as needed for sleep.     LUTEIN 20 PO  Take 1 tablet by mouth daily.  metoprolol 50 MG tablet  Commonly known as:  LOPRESSOR  Take 1 tablet (50 mg total) by mouth 2 (two) times daily.     sertraline 25 MG tablet  Commonly known as:  ZOLOFT  Take 3 tablets (75 mg total) by mouth every morning.         Physical Exam: Filed Vitals:   02/13/15 1534  BP: 153/95  Pulse: 86  Temp: 98.7 F (37.1 C)  Resp: 18  Weight: 121 lb 3.2 oz (54.976 kg)  SpO2: 98%    General- elderly female, well built, in no acute distress Head- normocephalic, atraumatic Nose- normal nasal mucosa, no maxillary or frontal sinus tenderness, no nasal discharge Throat- moist mucus membrane  Eyes- PERRLA, EOMI, no pallor, no icterus, no discharge, normal conjunctiva, normal  sclera Neck- no cervical lymphadenopathy Cardiovascular- normal s1,s2, no murmurs, palpable dorsalis pedis and radial pulses, no leg edema Respiratory- bilateral clear to auscultation, no wheeze, no rhonchi, no crackles, no use of accessory muscles Abdomen- bowel sounds present, soft, non tender Musculoskeletal- able to move all 4 extremities, generalized weakness, on wheelchair Neurological- no focal deficit, alert and oriented to person, place and time Skin- warm and dry Psychiatry- normal mood and affect    Labs reviewed: Basic Metabolic Panel:  Recent Labs  01/31/15 0306 02/01/15 0742 02/02/15 0330  02/06/15 0445 02/07/15 0448 02/08/15 0416 02/09/15 0700  NA 134* 135 133*  < > 133* 134* 135 136  K 4.1 3.3* 2.9*  < > 2.8* 3.7 3.5 4.0  CL 109 110 108  < > 99* 100* 98* 101  CO2 11* 15* 17*  < > 25 24 29 28   GLUCOSE 105* 107* 111*  < > 122* 127* 134* 117*  BUN 46* 49* 46*  < > 34* 31* 23* 16  CREATININE 3.57* 3.13* 2.54*  < > 1.30* 1.09* 1.05* 0.88  CALCIUM 6.9* 7.5* 8.0*  < > 7.1* 7.3* 7.6* 7.8*  MG 2.9* 2.6* 2.4  < > 1.8 1.5* 1.7  --   PHOS 2.3* 2.5 2.9  --   --   --   --   --   < > = values in this interval not displayed. Liver Function Tests:  Recent Labs  02/06/15 0445 02/07/15 0448 02/08/15 0416  AST 16 22 21   ALT 20 17 17   ALKPHOS 200* 234* 269*  BILITOT 0.8 0.9 0.6  PROT 4.8* 4.8* 5.4*  ALBUMIN 1.6* 1.7* 1.8*   No results for input(s): LIPASE, AMYLASE in the last 8760 hours. No results for input(s): AMMONIA in the last 8760 hours. CBC:  Recent Labs  02/06/15 0445 02/07/15 0448 02/08/15 0416 02/09/15 0700  WBC 15.0* 13.0* 11.1* 9.7  NEUTROABS 12.4* 11.1* 9.3*  --   HGB 11.3* 11.0* 10.7* 10.7*  HCT 31.6* 32.8* 32.6* 32.2*  MCV 76.1* 78.7 79.7 81.3  PLT 143* 162 182 185   Cardiac Enzymes:  Recent Labs  02/04/15 1902 02/05/15 0100 02/07/15 0448  TROPONINI 0.50* 0.53* 0.13*   BNP: Invalid input(s): POCBNP CBG:  Recent Labs   02/08/15 0747 02/08/15 1157 02/08/15 2102  GLUCAP 116* 117* 153*    Radiological Exams: Ct Abdomen Pelvis Wo Contrast  01/30/2015  CLINICAL DATA:  81 year old female with nausea vomiting and diarrhea for 6 days. Sepsis. Personal history of breast cancer. EXAM: CT ABDOMEN AND PELVIS WITHOUT CONTRAST TECHNIQUE: Multidetector CT imaging of the abdomen and pelvis was performed following the standard protocol without IV contrast. COMPARISON:  CT chest abdomen and pelvis  11/21/2010. FINDINGS: Moderate scoliosis. There are some chronic appearing right costovertebral angle rib fractures which are new since 2012, but no acute No acute osseous abnormality identified. Small to moderate layering bilateral pleural effusions associated with bilateral lower lobe consolidation and/or atelectasis. No pericardial effusion. Postoperative changes to the right breast with multiple surgical clips noted. Foley catheter in place an the urinary bladder is decompressed. No pelvic free fluid. Negative noncontrast uterus and adnexa. Fluid in the distal colon. Diverticulosis with fluid in the left colon. Fluid and gas in the transverse and right colon. All of these segments are only mildly distended. The distal small bowel is decompressed. No dilated small bowel. There is moderate to severe distension of the stomach with gas and fluid. No enteric tube identified. The duodenum appears to be decompressed. No free air in the abdomen. No abdominal free fluid identified. Noncontrast liver, gallbladder, spleen, pancreas and adrenal glands appear stable and within normal limits. Aortoiliac calcified atherosclerosis noted. No acute renal findings are evident. No lymphadenopathy identified. There is some bilateral flank and abdominal wall stranding which could be related to anasarca, but is predominantly non dependent. IMPRESSION: 1. Bilateral pleural effusions with lower lobe consolidation and atelectasis. 2. Moderate to severe gas and fluid  distension of the stomach consider NG tube decompression. No dilated small bowel. Fluid throughout the colon compatible with diarrhea. 3. Nonspecific body wall stranding, predominantly nondependent so consider cellulitis in addition to anasarca. Electronically Signed   By: Genevie Ann M.D.   On: 01/30/2015 15:09   Dg Chest Port 1 View  01/29/2015  CLINICAL DATA:  Hypotension hypothermia. History of breast cancer on the right. EXAM: PORTABLE CHEST 1 VIEW COMPARISON:  PA and lateral chest 11/09/2014 and 12/02/2014. FINDINGS: Streaky airspace disease is present in the right base appears and mildly increased compared to the prior study. The left lung is clear. Heart size is normal. No pneumothorax or pleural effusion. Marked thoracolumbar scoliosis is again seen. IMPRESSION: Some increase in streaky right basilar airspace opacities since the most recent examination could be due to atelectasis or infection. Electronically Signed   By: Inge Rise M.D.   On: 01/29/2015 15:45    Assessment/Plan  Physical deconditioning Will have her work with physical therapy and occupational therapy team to help with gait training and muscle strengthening exercises.fall precautions. Skin care. Encourage to be out of bed.   Loose stools Concern for antibiotic associated diarrhea. Will send stool to rule out c.diff. Start florastor 250 mg bid for 2 weeks  Protein calorie malnutrition Monitor weight, encourage po intake, get dietary consult. Continue procel supplement  Bacteremia Will complete her course of levofloxacin today. Monitor cbc and temp curve  Hypertension Elevated BP. On metoprolol 50 mg bid. Was on losartan-hctz at home which was discontinued with impaired renal function in the hospital. Renal function had normalized at discharge. Start losartan 25 mg daily for now. Check bp bid x 1 week, then daily and reassess.  Acute renal failure Monitor BMP, restarting losartan at low dose for now  Depression and  anxiety Stable, continue sertraline 75 mg daily, ativan 0.5 mg tid as needed and monitor  gerd On famotidine 20 mg daily as needed. Change this to daily for now  HLD Continue atorvastatin 40 mg daily  Goals of care: short term rehabilitation   Labs/tests ordered: cbc, cmp  Family/ staff Communication: reviewed care plan with patient and nursing supervisor    Blanchie Serve, MD  [facility] [phone] (Monday-Friday 8 am -  5 pm) [phone] (afterhours)

## 2015-03-09 DIAGNOSIS — Z853 Personal history of malignant neoplasm of breast: Secondary | ICD-10-CM | POA: Diagnosis not present

## 2015-03-09 DIAGNOSIS — F419 Anxiety disorder, unspecified: Secondary | ICD-10-CM | POA: Diagnosis not present

## 2015-03-09 DIAGNOSIS — I5030 Unspecified diastolic (congestive) heart failure: Secondary | ICD-10-CM | POA: Diagnosis not present

## 2015-03-09 DIAGNOSIS — Z9181 History of falling: Secondary | ICD-10-CM | POA: Diagnosis not present

## 2015-03-09 DIAGNOSIS — M810 Age-related osteoporosis without current pathological fracture: Secondary | ICD-10-CM | POA: Diagnosis not present

## 2015-03-09 DIAGNOSIS — F329 Major depressive disorder, single episode, unspecified: Secondary | ICD-10-CM | POA: Diagnosis not present

## 2015-03-09 DIAGNOSIS — M6281 Muscle weakness (generalized): Secondary | ICD-10-CM | POA: Diagnosis not present

## 2015-03-09 DIAGNOSIS — K5900 Constipation, unspecified: Secondary | ICD-10-CM | POA: Diagnosis not present

## 2015-03-09 DIAGNOSIS — I110 Hypertensive heart disease with heart failure: Secondary | ICD-10-CM | POA: Diagnosis not present

## 2015-03-09 DIAGNOSIS — E46 Unspecified protein-calorie malnutrition: Secondary | ICD-10-CM | POA: Diagnosis not present

## 2015-03-09 DIAGNOSIS — Z8701 Personal history of pneumonia (recurrent): Secondary | ICD-10-CM | POA: Diagnosis not present

## 2015-03-10 ENCOUNTER — Other Ambulatory Visit: Payer: Self-pay | Admitting: Family Medicine

## 2015-03-10 ENCOUNTER — Ambulatory Visit
Admission: RE | Admit: 2015-03-10 | Discharge: 2015-03-10 | Disposition: A | Discharge status: Home / Self Care | Payer: PPO | Source: Ambulatory Visit | Attending: Family Medicine | Admitting: Family Medicine

## 2015-03-10 DIAGNOSIS — J189 Pneumonia, unspecified organism: Secondary | ICD-10-CM

## 2015-03-16 DIAGNOSIS — M6281 Muscle weakness (generalized): Secondary | ICD-10-CM | POA: Diagnosis not present

## 2015-03-16 DIAGNOSIS — F419 Anxiety disorder, unspecified: Secondary | ICD-10-CM | POA: Diagnosis not present

## 2015-03-16 DIAGNOSIS — Z9181 History of falling: Secondary | ICD-10-CM | POA: Diagnosis not present

## 2015-03-16 DIAGNOSIS — K5900 Constipation, unspecified: Secondary | ICD-10-CM | POA: Diagnosis not present

## 2015-03-16 DIAGNOSIS — Z8701 Personal history of pneumonia (recurrent): Secondary | ICD-10-CM | POA: Diagnosis not present

## 2015-03-16 DIAGNOSIS — M810 Age-related osteoporosis without current pathological fracture: Secondary | ICD-10-CM | POA: Diagnosis not present

## 2015-03-16 DIAGNOSIS — E46 Unspecified protein-calorie malnutrition: Secondary | ICD-10-CM | POA: Diagnosis not present

## 2015-03-16 DIAGNOSIS — I110 Hypertensive heart disease with heart failure: Secondary | ICD-10-CM | POA: Diagnosis not present

## 2015-03-16 DIAGNOSIS — Z853 Personal history of malignant neoplasm of breast: Secondary | ICD-10-CM | POA: Diagnosis not present

## 2015-03-16 DIAGNOSIS — F329 Major depressive disorder, single episode, unspecified: Secondary | ICD-10-CM | POA: Diagnosis not present

## 2015-03-16 DIAGNOSIS — I5030 Unspecified diastolic (congestive) heart failure: Secondary | ICD-10-CM | POA: Diagnosis not present

## 2015-03-24 ENCOUNTER — Other Ambulatory Visit: Payer: Self-pay | Admitting: Internal Medicine

## 2015-04-06 ENCOUNTER — Ambulatory Visit
Admission: RE | Admit: 2015-04-06 | Discharge: 2015-04-06 | Disposition: A | Discharge status: Home / Self Care | Payer: PPO | Source: Ambulatory Visit | Attending: Family Medicine | Admitting: Family Medicine

## 2015-04-06 ENCOUNTER — Other Ambulatory Visit: Payer: Self-pay | Admitting: Family Medicine

## 2015-04-06 DIAGNOSIS — R918 Other nonspecific abnormal finding of lung field: Secondary | ICD-10-CM

## 2015-04-06 DIAGNOSIS — J189 Pneumonia, unspecified organism: Secondary | ICD-10-CM | POA: Diagnosis not present

## 2015-04-09 DIAGNOSIS — I10 Essential (primary) hypertension: Secondary | ICD-10-CM | POA: Diagnosis not present

## 2015-04-12 DIAGNOSIS — R945 Abnormal results of liver function studies: Secondary | ICD-10-CM | POA: Diagnosis not present

## 2015-04-12 DIAGNOSIS — D508 Other iron deficiency anemias: Secondary | ICD-10-CM | POA: Diagnosis not present

## 2015-04-12 DIAGNOSIS — I10 Essential (primary) hypertension: Secondary | ICD-10-CM | POA: Diagnosis not present

## 2015-04-17 ENCOUNTER — Other Ambulatory Visit: Payer: Self-pay | Admitting: Family Medicine

## 2015-04-17 DIAGNOSIS — R7989 Other specified abnormal findings of blood chemistry: Secondary | ICD-10-CM

## 2015-04-17 DIAGNOSIS — R945 Abnormal results of liver function studies: Principal | ICD-10-CM

## 2015-04-27 ENCOUNTER — Ambulatory Visit
Admission: RE | Admit: 2015-04-27 | Discharge: 2015-04-27 | Disposition: A | Discharge status: Home / Self Care | Payer: PPO | Source: Ambulatory Visit | Attending: Family Medicine | Admitting: Family Medicine

## 2015-04-27 DIAGNOSIS — R7989 Other specified abnormal findings of blood chemistry: Secondary | ICD-10-CM

## 2015-04-27 DIAGNOSIS — R945 Abnormal results of liver function studies: Principal | ICD-10-CM

## 2015-05-03 DIAGNOSIS — L0100 Impetigo, unspecified: Secondary | ICD-10-CM | POA: Diagnosis not present

## 2015-05-03 DIAGNOSIS — Z853 Personal history of malignant neoplasm of breast: Secondary | ICD-10-CM | POA: Diagnosis not present

## 2015-05-03 DIAGNOSIS — F419 Anxiety disorder, unspecified: Secondary | ICD-10-CM | POA: Diagnosis not present

## 2015-05-03 DIAGNOSIS — R110 Nausea: Secondary | ICD-10-CM | POA: Diagnosis not present

## 2015-05-03 DIAGNOSIS — F324 Major depressive disorder, single episode, in partial remission: Secondary | ICD-10-CM | POA: Diagnosis not present

## 2015-05-03 DIAGNOSIS — E7800 Pure hypercholesterolemia, unspecified: Secondary | ICD-10-CM | POA: Diagnosis not present

## 2015-05-03 DIAGNOSIS — M810 Age-related osteoporosis without current pathological fracture: Secondary | ICD-10-CM | POA: Diagnosis not present

## 2015-05-03 DIAGNOSIS — R740 Nonspecific elevation of levels of transaminase and lactic acid dehydrogenase [LDH]: Secondary | ICD-10-CM | POA: Diagnosis not present

## 2015-05-03 DIAGNOSIS — I10 Essential (primary) hypertension: Secondary | ICD-10-CM | POA: Diagnosis not present

## 2015-05-03 DIAGNOSIS — Z0000 Encounter for general adult medical examination without abnormal findings: Secondary | ICD-10-CM | POA: Diagnosis not present

## 2015-05-03 DIAGNOSIS — R946 Abnormal results of thyroid function studies: Secondary | ICD-10-CM | POA: Diagnosis not present

## 2015-05-05 ENCOUNTER — Other Ambulatory Visit: Payer: Self-pay | Admitting: Oncology

## 2015-05-24 DIAGNOSIS — Z8619 Personal history of other infectious and parasitic diseases: Secondary | ICD-10-CM | POA: Diagnosis not present

## 2015-05-24 DIAGNOSIS — R740 Nonspecific elevation of levels of transaminase and lactic acid dehydrogenase [LDH]: Secondary | ICD-10-CM | POA: Diagnosis not present

## 2015-05-24 DIAGNOSIS — R7989 Other specified abnormal findings of blood chemistry: Secondary | ICD-10-CM | POA: Diagnosis not present

## 2015-06-01 DIAGNOSIS — R945 Abnormal results of liver function studies: Secondary | ICD-10-CM | POA: Diagnosis not present

## 2015-06-05 DIAGNOSIS — K831 Obstruction of bile duct: Secondary | ICD-10-CM | POA: Diagnosis not present

## 2015-06-07 DIAGNOSIS — M810 Age-related osteoporosis without current pathological fracture: Secondary | ICD-10-CM | POA: Diagnosis not present

## 2015-06-12 DIAGNOSIS — K831 Obstruction of bile duct: Secondary | ICD-10-CM | POA: Diagnosis not present

## 2015-06-14 DIAGNOSIS — F419 Anxiety disorder, unspecified: Secondary | ICD-10-CM | POA: Diagnosis not present

## 2015-06-21 ENCOUNTER — Other Ambulatory Visit (HOSPITAL_COMMUNITY): Payer: Self-pay | Admitting: Gastroenterology

## 2015-06-21 DIAGNOSIS — R7989 Other specified abnormal findings of blood chemistry: Secondary | ICD-10-CM

## 2015-06-21 DIAGNOSIS — R945 Abnormal results of liver function studies: Principal | ICD-10-CM

## 2015-06-22 ENCOUNTER — Other Ambulatory Visit: Payer: Self-pay | Admitting: Radiology

## 2015-06-22 NOTE — Patient Instructions (Addendum)
NPO after 0600 am meds with sips needs driver

## 2015-06-23 ENCOUNTER — Other Ambulatory Visit: Payer: Self-pay | Admitting: Radiology

## 2015-06-26 ENCOUNTER — Ambulatory Visit (HOSPITAL_COMMUNITY)
Admission: RE | Admit: 2015-06-26 | Discharge: 2015-06-26 | Disposition: A | Discharge status: Home / Self Care | Payer: PPO | Source: Ambulatory Visit | Attending: Gastroenterology | Admitting: Gastroenterology

## 2015-06-26 ENCOUNTER — Encounter (HOSPITAL_COMMUNITY): Payer: Self-pay

## 2015-06-26 DIAGNOSIS — K759 Inflammatory liver disease, unspecified: Secondary | ICD-10-CM | POA: Diagnosis not present

## 2015-06-26 DIAGNOSIS — K830 Cholangitis: Secondary | ICD-10-CM | POA: Insufficient documentation

## 2015-06-26 DIAGNOSIS — R7989 Other specified abnormal findings of blood chemistry: Secondary | ICD-10-CM

## 2015-06-26 DIAGNOSIS — R748 Abnormal levels of other serum enzymes: Secondary | ICD-10-CM | POA: Insufficient documentation

## 2015-06-26 DIAGNOSIS — Z79899 Other long term (current) drug therapy: Secondary | ICD-10-CM | POA: Diagnosis not present

## 2015-06-26 DIAGNOSIS — R945 Abnormal results of liver function studies: Secondary | ICD-10-CM

## 2015-06-26 LAB — PROTIME-INR
INR: 1.03 (ref 0.00–1.49)
PROTHROMBIN TIME: 13.7 s (ref 11.6–15.2)

## 2015-06-26 LAB — CBC
HCT: 42.9 % (ref 36.0–46.0)
Hemoglobin: 13.9 g/dL (ref 12.0–15.0)
MCH: 26.5 pg (ref 26.0–34.0)
MCHC: 32.4 g/dL (ref 30.0–36.0)
MCV: 81.9 fL (ref 78.0–100.0)
PLATELETS: 175 10*3/uL (ref 150–400)
RBC: 5.24 MIL/uL — ABNORMAL HIGH (ref 3.87–5.11)
RDW: 18.4 % — ABNORMAL HIGH (ref 11.5–15.5)
WBC: 5.1 10*3/uL (ref 4.0–10.5)

## 2015-06-26 LAB — APTT: APTT: 43 s — AB (ref 24–37)

## 2015-06-26 MED ORDER — FENTANYL CITRATE (PF) 100 MCG/2ML IJ SOLN
INTRAMUSCULAR | Status: AC | PRN
  Administered 2015-06-26: 50 ug via INTRAVENOUS

## 2015-06-26 MED ORDER — LIDOCAINE HCL (PF) 1 % IJ SOLN
INTRAMUSCULAR | Status: AC
  Filled 2015-06-26: qty 10

## 2015-06-26 MED ORDER — MIDAZOLAM HCL 2 MG/2ML IJ SOLN
INTRAMUSCULAR | Status: AC | PRN
  Administered 2015-06-26: 1 mg via INTRAVENOUS

## 2015-06-26 MED ORDER — SODIUM CHLORIDE 0.9 % IV SOLN: Freq: Once | INTRAVENOUS | Status: DC

## 2015-06-26 MED ORDER — GELATIN ABSORBABLE 12-7 MM EX MISC
CUTANEOUS | Status: AC
  Filled 2015-06-26: qty 1

## 2015-06-26 MED ORDER — SODIUM CHLORIDE 0.9 % IV SOLN
INTRAVENOUS | Status: AC | PRN
  Administered 2015-06-26: 10 mL/h via INTRAVENOUS

## 2015-06-26 MED ORDER — HYDROCODONE-ACETAMINOPHEN 5-325 MG PO TABS: 1.0000 | ORAL_TABLET | ORAL | Status: DC | PRN

## 2015-06-26 MED ORDER — FENTANYL CITRATE (PF) 100 MCG/2ML IJ SOLN
INTRAMUSCULAR | Status: AC
  Filled 2015-06-26: qty 2

## 2015-06-26 MED ORDER — MIDAZOLAM HCL 2 MG/2ML IJ SOLN
INTRAMUSCULAR | Status: AC
  Filled 2015-06-26: qty 2

## 2015-06-26 NOTE — Procedures (Signed)
US guided core biopsies of right hepatic lobe.  3 cores obtained.  Minimal blood loss.  No immediate complication.

## 2015-06-26 NOTE — Progress Notes (Signed)
Dr. Anselm Pancoast aware, will proceed to procedure

## 2015-06-26 NOTE — H&P (Signed)
Chief Complaint: Patient was seen in consultation today for random liver core biopsy at the request of [place]  Referring Physician(s): Edwards,James  Supervising Physician: Markus Daft   Out Patient  History of Present Illness: Jenna [name] is a 82 y.o. female   Abnormal liver function tests Normal as recent as 10/2014 Was hospitalized 01/2015 with ARF +BC and sepsis  Some medications changed while she was in hospital Discharged with renal functions back to normal  Since then has been found to have persistent elevated liver functions Work up with Dr Percell Miller has not shown reason Now scheduled for random liver biopsy   Past Medical History  Diagnosis Date  . Hypercholesteremia   . Anxiety   . Osteoporosis   . Breast cancer, IDC, Right, Stage I 11/07/2010  . Constipation     Past Surgical History  Procedure Laterality Date  . Breast lumpectomy w/ needle localization  11/26/2010    Right - Dr Margot Chimes  . Tumor removal  1960, 1970    benign tumor from bilateral breast  . Hemorrhoid surgery    . Tonsillectomy      Allergies: Iodine; Lactose intolerance (gi); and Sulfa antibiotics  Medications: Prior to Admission medications   Medication Sig Start Date End Date Taking? Authorizing Provider  amLODipine (NORVASC) 5 MG tablet Take 5 mg by mouth daily. 05/07/15  Yes Historical Provider, MD  atenolol (TENORMIN) 50 MG tablet Take 50 mg by mouth 2 (two) times daily.   Yes Historical Provider, MD  atorvastatin (LIPITOR) 40 MG tablet Take 40 mg by mouth daily. Reported on 06/22/2015 10/03/10  Yes Historical Provider, MD  cholecalciferol (VITAMIN D) 1000 UNITS tablet Take 1,000 Units by mouth daily.     Yes Historical Provider, MD  LORazepam (ATIVAN) 1 MG tablet Take 0.5-1 mg by mouth 2 (two) times daily as needed for anxiety or sleep.    Yes Historical Provider, MD  senna (SENOKOT) 8.6 MG tablet Take 1 tablet by mouth at bedtime.   Yes Historical Provider, MD  sertraline  (ZOLOFT) 50 MG tablet Take 100 mg by mouth every morning. 04/29/15  Yes Historical Provider, MD  Misc Natural Products (LUTEIN 20 PO) Take 1 tablet by mouth daily. Reported on 06/22/2015    Historical Provider, MD     Family History  Problem Relation Age of Onset  . Cancer Mother     lung  . Heart disease Mother   . Heart disease Father   . Heart disease Brother   . Heart disease Paternal Uncle   . Heart disease Maternal Grandmother   . Stroke Maternal Grandfather   . Heart disease Paternal Grandfather     Social History   Social History  . Marital Status: Widowed    Spouse Name: N/A  . Number of Children: N/A  . Years of Education: N/A   Occupational History  . retired    Social History Main Topics  . Smoking status: Never Smoker   . Smokeless tobacco: Never Used  . Alcohol Use: No  . Drug Use: No  . Sexual Activity: No   Other Topics Concern  . None   Social History Narrative     Review of Systems: A 12 point ROS discussed and pertinent positives are indicated in the HPI above.  All other systems are negative.  Review of Systems  Constitutional: Positive for fatigue. Negative for fever, activity change and appetite change.  Respiratory: Negative for shortness of breath.   Gastrointestinal: Negative for  abdominal pain.  Neurological: Positive for weakness.  Psychiatric/Behavioral: Negative for behavioral problems and confusion.    Vital Signs: BP 177/73 mmHg  Pulse 65  Temp(Src) 97.7 F (36.5 C)  Resp 18  Ht 4\' 11"  (1.499 m)  Wt 108 lb (48.988 kg)  BMI 21.80 kg/m2  SpO2 96%  Physical Exam  Constitutional: She is oriented to person, place, and time.  Cardiovascular: Normal rate and regular rhythm.   Pulmonary/Chest: Effort normal and breath sounds normal. She has no wheezes.  Abdominal: Soft. Bowel sounds are normal. There is no tenderness.  Musculoskeletal: Normal range of motion.  Neurological: She is alert and oriented to person, place, and time.    Skin: Skin is warm and dry.  Psychiatric: She has a normal mood and affect. Her behavior is normal. Judgment and thought content normal.  Nursing note and vitals reviewed.   Mallampati Score:  MD Evaluation Airway: WNL Heart: WNL Abdomen: WNL Chest/ Lungs: WNL ASA  Classification: 2 Mallampati/Airway Score: Two  Imaging: No results found.  Labs:  CBC:  Recent Labs  02/07/15 0448 02/08/15 0416 02/09/15 0700 06/26/15 1147  WBC 13.0* 11.1* 9.7 5.1  HGB 11.0* 10.7* 10.7* 13.9  HCT 32.8* 32.6* 32.2* 42.9  PLT 162 182 185 175    COAGS:  Recent Labs  01/30/15 0300 01/30/15 0805 02/05/15 1500 06/26/15 1147  INR 1.33 1.39 1.22 1.03  APTT  --  36 26 43*    BMP:  Recent Labs  02/06/15 0445 02/07/15 0448 02/08/15 0416 02/09/15 0700  NA 133* 134* 135 136  K 2.8* 3.7 3.5 4.0  CL 99* 100* 98* 101  CO2 25 24 29 28   GLUCOSE 122* 127* 134* 117*  BUN 34* 31* 23* 16  CALCIUM 7.1* 7.3* 7.6* 7.8*  CREATININE 1.30* 1.09* 1.05* 0.88  GFRNONAA 37* 46* 48* 60*  GFRAA 43* 54* 56* >60    LIVER FUNCTION TESTS:  Recent Labs  02/05/15 0100 02/06/15 0445 02/07/15 0448 02/08/15 0416  BILITOT 1.9* 0.8 0.9 0.6  AST 25 16 22 21   ALT 27 20 17 17   ALKPHOS 232* 200* 234* 269*  PROT 4.5* 4.8* 4.8* 5.4*  ALBUMIN 1.7* 1.6* 1.7* 1.8*    TUMOR MARKERS: No results for input(s): AFPTM, CEA, CA199, CHROMGRNA in the last 8760 hours.  Assessment and Plan:  Persistent elevated liver function tests Scheduled for random liver core biopsy Risks and Benefits discussed with the patient including, but not limited to bleeding, infection, damage to adjacent structures or low yield requiring additional tests. All of the patient's questions were answered, patient is agreeable to proceed. Consent signed and in chart.   Thank you for this interesting consult.  I greatly enjoyed meeting CAMARON DEBERNARDI and look forward to participating in their care.  A copy of this report was sent  to the requesting provider on this date.  Electronically Signed: Monia Sabal A 06/26/2015, 12:32 PM   I spent a total of  30 Minutes   in face to face in clinical consultation, greater than 50% of which was counseling/coordinating care for liver core biopsy

## 2015-06-26 NOTE — Discharge Instructions (Signed)

## 2015-07-25 DIAGNOSIS — R748 Abnormal levels of other serum enzymes: Secondary | ICD-10-CM | POA: Diagnosis not present

## 2015-07-25 DIAGNOSIS — K759 Inflammatory liver disease, unspecified: Secondary | ICD-10-CM | POA: Diagnosis not present

## 2015-08-03 DIAGNOSIS — R748 Abnormal levels of other serum enzymes: Secondary | ICD-10-CM | POA: Diagnosis not present

## 2015-08-03 DIAGNOSIS — K7290 Hepatic failure, unspecified without coma: Secondary | ICD-10-CM | POA: Diagnosis not present

## 2015-08-03 DIAGNOSIS — K759 Inflammatory liver disease, unspecified: Secondary | ICD-10-CM | POA: Diagnosis not present

## 2015-08-03 DIAGNOSIS — Z129 Encounter for screening for malignant neoplasm, site unspecified: Secondary | ICD-10-CM | POA: Diagnosis not present

## 2015-08-04 ENCOUNTER — Other Ambulatory Visit: Payer: Self-pay | Admitting: Nurse Practitioner

## 2015-08-04 DIAGNOSIS — R7989 Other specified abnormal findings of blood chemistry: Secondary | ICD-10-CM

## 2015-08-04 DIAGNOSIS — R945 Abnormal results of liver function studies: Principal | ICD-10-CM

## 2015-08-16 DIAGNOSIS — H2513 Age-related nuclear cataract, bilateral: Secondary | ICD-10-CM | POA: Diagnosis not present

## 2015-08-18 ENCOUNTER — Ambulatory Visit
Admission: RE | Admit: 2015-08-18 | Discharge: 2015-08-18 | Disposition: A | Discharge status: Home / Self Care | Payer: PPO | Source: Ambulatory Visit | Attending: Nurse Practitioner | Admitting: Nurse Practitioner

## 2015-08-18 DIAGNOSIS — R7989 Other specified abnormal findings of blood chemistry: Secondary | ICD-10-CM

## 2015-08-18 DIAGNOSIS — R188 Other ascites: Secondary | ICD-10-CM | POA: Diagnosis not present

## 2015-08-18 DIAGNOSIS — R945 Abnormal results of liver function studies: Principal | ICD-10-CM

## 2015-08-18 DIAGNOSIS — R935 Abnormal findings on diagnostic imaging of other abdominal regions, including retroperitoneum: Secondary | ICD-10-CM | POA: Diagnosis not present

## 2015-08-18 MED ORDER — GADOXETATE DISODIUM 0.25 MMOL/ML IV SOLN
5.0000 mL | Freq: Once | INTRAVENOUS | Status: AC | PRN
  Administered 2015-08-18: 5 mL via INTRAVENOUS

## 2015-08-24 DIAGNOSIS — F411 Generalized anxiety disorder: Secondary | ICD-10-CM | POA: Diagnosis not present

## 2015-08-24 DIAGNOSIS — I10 Essential (primary) hypertension: Secondary | ICD-10-CM | POA: Diagnosis not present

## 2015-09-08 DIAGNOSIS — A419 Sepsis, unspecified organism: Secondary | ICD-10-CM | POA: Diagnosis not present

## 2015-09-08 DIAGNOSIS — R748 Abnormal levels of other serum enzymes: Secondary | ICD-10-CM | POA: Diagnosis not present

## 2015-09-08 DIAGNOSIS — R5081 Fever presenting with conditions classified elsewhere: Secondary | ICD-10-CM | POA: Diagnosis not present

## 2015-09-11 ENCOUNTER — Other Ambulatory Visit: Payer: Self-pay | Admitting: Nurse Practitioner

## 2015-09-11 DIAGNOSIS — K741 Hepatic sclerosis: Secondary | ICD-10-CM

## 2015-09-11 DIAGNOSIS — K759 Inflammatory liver disease, unspecified: Secondary | ICD-10-CM

## 2015-09-15 ENCOUNTER — Ambulatory Visit
Admission: RE | Admit: 2015-09-15 | Discharge: 2015-09-15 | Disposition: A | Discharge status: Home / Self Care | Payer: PPO | Source: Ambulatory Visit | Attending: Nurse Practitioner | Admitting: Nurse Practitioner

## 2015-09-15 DIAGNOSIS — K838 Other specified diseases of biliary tract: Secondary | ICD-10-CM | POA: Diagnosis not present

## 2015-09-15 DIAGNOSIS — K741 Hepatic sclerosis: Secondary | ICD-10-CM

## 2015-09-15 DIAGNOSIS — K759 Inflammatory liver disease, unspecified: Secondary | ICD-10-CM

## 2015-09-15 MED ORDER — IOPAMIDOL (ISOVUE-300) INJECTION 61%
100.0000 mL | Freq: Once | INTRAVENOUS | Status: AC | PRN
  Administered 2015-09-15: 100 mL via INTRAVENOUS

## 2015-10-04 ENCOUNTER — Encounter (HOSPITAL_COMMUNITY): Payer: Self-pay | Admitting: *Deleted

## 2015-10-10 ENCOUNTER — Other Ambulatory Visit: Payer: Self-pay | Admitting: Gastroenterology

## 2015-10-10 NOTE — Anesthesia Preprocedure Evaluation (Addendum)
Anesthesia Evaluation  Patient identified by MRN, date of birth, ID band Patient awake    Reviewed: Allergy & Precautions, NPO status , Patient's Chart, lab work & pertinent test results, reviewed documented beta blocker date and time   History of Anesthesia Complications Negative for: history of anesthetic complications  Airway Mallampati: IV  TM Distance: >3 FB Neck ROM: Full  Mouth opening: Limited Mouth Opening  Dental  (+) Teeth Intact, Dental Advisory Given   Pulmonary neg pulmonary ROS, neg shortness of breath, neg sleep apnea, neg COPD, neg recent URI,    Pulmonary exam normal breath sounds clear to auscultation       Cardiovascular hypertension, Pt. on medications and Pt. on home beta blockers (-) angina(-) Past MI, (-) Cardiac Stents and (-) CABG  Rhythm:Regular Rate:Normal  TTE 01/31/2015: Study Conclusions  - Left ventricle: The cavity size was normal. Wall thickness was   normal. Systolic function was vigorous. The estimated ejection   fraction was in the range of 65% to 70%. Wall motion was normal;   there were no regional wall motion abnormalities. Doppler   parameters are consistent with abnormal left ventricular   relaxation (grade 1 diastolic dysfunction).   Neuro/Psych PSYCHIATRIC DISORDERS Anxiety negative neurological ROS     GI/Hepatic negative GI ROS, Elevated LFTs   Endo/Other  negative endocrine ROS  Renal/GU negative Renal ROS     Musculoskeletal osteoporosis   Abdominal   Peds  Hematology negative hematology ROS (+)   Anesthesia Other Findings HLD, h/o right breast cancer in 2012  Reproductive/Obstetrics                           Anesthesia Physical Anesthesia Plan  ASA: II  Anesthesia Plan: General   Post-op Pain Management:    Induction: Intravenous  Airway Management Planned: Oral ETT and Video Laryngoscope Planned  Additional Equipment:    Intra-op Plan:   Post-operative Plan: Extubation in OR  Informed Consent: I have reviewed the patients History and Physical, chart, labs and discussed the procedure including the risks, benefits and alternatives for the proposed anesthesia with the patient or authorized representative who has indicated his/her understanding and acceptance.   Dental advisory given  Plan Discussed with:   Anesthesia Plan Comments:        Anesthesia Quick Evaluation

## 2015-10-11 ENCOUNTER — Ambulatory Visit (HOSPITAL_COMMUNITY): Payer: PPO | Admitting: Anesthesiology

## 2015-10-11 ENCOUNTER — Ambulatory Visit (HOSPITAL_COMMUNITY): Payer: PPO

## 2015-10-11 ENCOUNTER — Encounter (HOSPITAL_COMMUNITY)
Admission: RE | Disposition: A | Discharge status: Home / Self Care | Payer: Self-pay | Source: Ambulatory Visit | Attending: Gastroenterology

## 2015-10-11 ENCOUNTER — Ambulatory Visit (HOSPITAL_COMMUNITY)
Admission: RE | Admit: 2015-10-11 | Discharge: 2015-10-11 | Disposition: A | Discharge status: Home / Self Care | Payer: PPO | Source: Ambulatory Visit | Attending: Gastroenterology | Admitting: Gastroenterology

## 2015-10-11 DIAGNOSIS — J449 Chronic obstructive pulmonary disease, unspecified: Secondary | ICD-10-CM | POA: Diagnosis not present

## 2015-10-11 DIAGNOSIS — R7989 Other specified abnormal findings of blood chemistry: Secondary | ICD-10-CM | POA: Diagnosis not present

## 2015-10-11 DIAGNOSIS — R932 Abnormal findings on diagnostic imaging of liver and biliary tract: Secondary | ICD-10-CM | POA: Diagnosis not present

## 2015-10-11 DIAGNOSIS — F419 Anxiety disorder, unspecified: Secondary | ICD-10-CM | POA: Insufficient documentation

## 2015-10-11 DIAGNOSIS — I10 Essential (primary) hypertension: Secondary | ICD-10-CM | POA: Diagnosis not present

## 2015-10-11 DIAGNOSIS — Z79899 Other long term (current) drug therapy: Secondary | ICD-10-CM | POA: Diagnosis not present

## 2015-10-11 DIAGNOSIS — K8689 Other specified diseases of pancreas: Secondary | ICD-10-CM | POA: Diagnosis not present

## 2015-10-11 DIAGNOSIS — E785 Hyperlipidemia, unspecified: Secondary | ICD-10-CM | POA: Diagnosis not present

## 2015-10-11 DIAGNOSIS — K8050 Calculus of bile duct without cholangitis or cholecystitis without obstruction: Secondary | ICD-10-CM | POA: Diagnosis not present

## 2015-10-11 DIAGNOSIS — R935 Abnormal findings on diagnostic imaging of other abdominal regions, including retroperitoneum: Secondary | ICD-10-CM | POA: Diagnosis not present

## 2015-10-11 DIAGNOSIS — R748 Abnormal levels of other serum enzymes: Secondary | ICD-10-CM | POA: Diagnosis not present

## 2015-10-11 DIAGNOSIS — K838 Other specified diseases of biliary tract: Secondary | ICD-10-CM | POA: Insufficient documentation

## 2015-10-11 HISTORY — PX: ERCP: SHX5425

## 2015-10-11 HISTORY — DX: Essential (primary) hypertension: I10

## 2015-10-11 HISTORY — DX: Sepsis, unspecified organism: A41.9

## 2015-10-11 HISTORY — DX: Pneumonia, unspecified organism: J18.9

## 2015-10-11 HISTORY — PX: EUS: SHX5427

## 2015-10-11 SURGERY — ESOPHAGEAL ENDOSCOPIC ULTRASOUND (EUS) RADIAL
Anesthesia: General

## 2015-10-11 MED ORDER — PROPOFOL 10 MG/ML IV BOLUS
INTRAVENOUS | Status: AC
  Filled 2015-10-11: qty 40

## 2015-10-11 MED ORDER — GLUCAGON HCL RDNA (DIAGNOSTIC) 1 MG IJ SOLR
INTRAMUSCULAR | Status: AC
  Filled 2015-10-11: qty 1

## 2015-10-11 MED ORDER — SUCCINYLCHOLINE CHLORIDE 20 MG/ML IJ SOLN
INTRAMUSCULAR | Status: DC | PRN
  Administered 2015-10-11: 100 mg via INTRAVENOUS
  Administered 2015-10-11: 20 mg via INTRAVENOUS

## 2015-10-11 MED ORDER — PROPOFOL 500 MG/50ML IV EMUL
INTRAVENOUS | Status: DC | PRN
  Administered 2015-10-11: 150 ug/kg/min via INTRAVENOUS

## 2015-10-11 MED ORDER — ONDANSETRON HCL 4 MG/2ML IJ SOLN
INTRAMUSCULAR | Status: AC
  Filled 2015-10-11: qty 2

## 2015-10-11 MED ORDER — PROPOFOL 10 MG/ML IV BOLUS
INTRAVENOUS | Status: DC | PRN
  Administered 2015-10-11: 150 mg via INTRAVENOUS

## 2015-10-11 MED ORDER — METHYLPREDNISOLONE SODIUM SUCC 125 MG IJ SOLR
125.0000 mg | Freq: Once | INTRAMUSCULAR | Status: AC
  Administered 2015-10-11: 125 mg via INTRAVENOUS
  Filled 2015-10-11: qty 2

## 2015-10-11 MED ORDER — CIPROFLOXACIN IN D5W 400 MG/200ML IV SOLN
INTRAVENOUS | Status: AC
  Filled 2015-10-11: qty 200

## 2015-10-11 MED ORDER — ONDANSETRON HCL 4 MG/2ML IJ SOLN
INTRAMUSCULAR | Status: DC | PRN
  Administered 2015-10-11: 4 mg via INTRAVENOUS

## 2015-10-11 MED ORDER — INDOMETHACIN 50 MG RE SUPP
RECTAL | Status: DC | PRN
Start: 2015-10-11 — End: 2015-10-11
  Administered 2015-10-11 (×2): 50 mg via RECTAL

## 2015-10-11 MED ORDER — LACTATED RINGERS IV SOLN
INTRAVENOUS | Status: DC
  Administered 2015-10-11 (×2): via INTRAVENOUS

## 2015-10-11 MED ORDER — CIPROFLOXACIN IN D5W 400 MG/200ML IV SOLN
INTRAVENOUS | Status: DC | PRN
  Administered 2015-10-11: 400 mg via INTRAVENOUS

## 2015-10-11 MED ORDER — GLUCAGON HCL RDNA (DIAGNOSTIC) 1 MG IJ SOLR
INTRAMUSCULAR | Status: DC | PRN
  Administered 2015-10-11 (×2): .5 mg via INTRAVENOUS

## 2015-10-11 MED ORDER — INDOMETHACIN 50 MG RE SUPP
RECTAL | Status: AC
  Filled 2015-10-11: qty 2

## 2015-10-11 MED ORDER — SODIUM CHLORIDE 0.9 % IV SOLN: INTRAVENOUS | Status: DC

## 2015-10-11 MED ORDER — EPHEDRINE SULFATE 50 MG/ML IJ SOLN
INTRAMUSCULAR | Status: DC | PRN
  Administered 2015-10-11 (×3): 10 mg via INTRAVENOUS

## 2015-10-11 MED ORDER — EPINEPHRINE HCL 0.1 MG/ML IJ SOSY
PREFILLED_SYRINGE | INTRAMUSCULAR | Status: DC | PRN
  Administered 2015-10-11: 20 mL

## 2015-10-11 MED ORDER — LIDOCAINE HCL (CARDIAC) 20 MG/ML IV SOLN
INTRAVENOUS | Status: DC | PRN
  Administered 2015-10-11: 50 mg via INTRATRACHEAL

## 2015-10-11 MED ORDER — FENTANYL CITRATE (PF) 100 MCG/2ML IJ SOLN
INTRAMUSCULAR | Status: DC | PRN
  Administered 2015-10-11 (×4): 25 ug via INTRAVENOUS

## 2015-10-11 MED ORDER — EPINEPHRINE HCL 0.1 MG/ML IJ SOSY
PREFILLED_SYRINGE | INTRAMUSCULAR | Status: AC
  Filled 2015-10-11: qty 20

## 2015-10-11 NOTE — Op Note (Signed)
[facility] Patient Name: Jenna [name] Procedure Date: 10/11/2015 MRN: [id number] Attending MD: Arta Silence , MD Date of Birth: February 13, 1932 CSN: [id number] Age: 82 Admit Type: Outpatient Procedure:                Upper EUS Indications:              Common bile duct dilation (etiology unknown) seen                            on CT scan, Abnormal abdominal/pelvic CT scan,                            Elevated liver enzymes, Suspected                            choledocholithiasis Providers:                Arta Silence, MD, Malka So, RN, Elspeth Cho Tech., Technician Referring MD:              Medicines:                Monitored Anesthesia Care Complications:            No immediate complications. Estimated Blood Loss:     Estimated blood loss was minimal. Procedure:                Pre-Anesthesia Assessment:                           - Prior to the procedure, a History and Physical                            was performed, and patient medications and                            allergies were reviewed. The patient's tolerance of                            previous anesthesia was also reviewed. The risks                            and benefits of the procedure and the sedation                            options and risks were discussed with the patient.                            All questions were answered, and informed consent                            was obtained. Prior Anticoagulants: The patient has                            taken no previous anticoagulant  or antiplatelet                            agents. ASA Grade Assessment: III - A patient with                            severe systemic disease. After reviewing the risks                            and benefits, the patient was deemed in                            satisfactory condition to undergo the procedure.                           After obtaining informed consent,  the endoscope was                            passed under direct vision. Throughout the                            procedure, the patient's blood pressure, pulse, and                            oxygen saturations were monitored continuously. The                            [id number]) scope was introduced through                            the mouth, and advanced to the second part of                            duodenum. The upper EUS was accomplished without                            difficulty. The patient tolerated the procedure                            well. Scope In: Scope Out: Findings:      Endosonographic Finding :      Many stones (at least 3) were visualized endosonographically in the main       bile duct. The stones were irregular. They were characterized by       shadowing.      There was dilation in the lower third of the main bile duct. Gallbladder       appeared normal. There is suggestion of periampullary diverticulum. Impression:               - Many stones were visualized endosonographically                            in the entire main bile duct.                           -  There was dilation in the lower third of the main                            bile duct.                           - Gallbladder was normal. Moderate Sedation:      N/A- Per Anesthesia Care Recommendation:           - Perform an ERCP today. Procedure Code(s):        --- Professional ---                           [phone], Esophagogastroduodenoscopy, flexible,                            transoral; with endoscopic ultrasound examination,                            including the esophagus, stomach, and either the                            duodenum or a surgically altered stomach where the                            jejunum is examined distal to the anastomosis Diagnosis Code(s):        --- Professional ---                           K80.50, Calculus of bile duct without cholangitis                             or cholecystitis without obstruction                           R74.8, Abnormal levels of other serum enzymes                           K83.8, Other specified diseases of biliary tract                           R93.5, Abnormal findings on diagnostic imaging of                            other abdominal regions, including retroperitoneum                           R93.2, Abnormal findings on diagnostic imaging of                            liver and biliary tract CPT copyright 2016 [employer]. All rights reserved. The codes documented in this report are preliminary and upon coder review may  be revised to meet current compliance requirements. Arta Silence, MD 10/11/2015 [id number] AM This report has been signed electronically. Number of Addenda: 0

## 2015-10-11 NOTE — Transfer of Care (Signed)
Immediate Anesthesia Transfer of Care Note  Patient: Jenna [name]  Procedure(s) Performed: Procedure(s): ESOPHAGEAL ENDOSCOPIC ULTRASOUND (EUS) RADIAL (N/A) ENDOSCOPIC RETROGRADE CHOLANGIOPANCREATOGRAPHY (ERCP) (N/A)  Patient Location: PACU  Anesthesia Type:General  Level of Consciousness:  sedated, patient cooperative and responds to stimulation  Airway & Oxygen Therapy:Patient Spontanous Breathing and Patient connected to face mask oxgen  Post-op Assessment:  Report given to PACU RN and Post -op Vital signs reviewed and stable  Post vital signs:  Reviewed and stable  Last Vitals:  Vitals:   10/11/15 0832  BP: (!) 184/87  Pulse: 69  Resp: 16  Temp: [id number] C    Complications: No apparent anesthesia complications

## 2015-10-11 NOTE — Op Note (Signed)
[facility] Patient Name: Jenna [name] Procedure Date: 10/11/2015 MRN: [id number] Attending MD: Arta Silence , MD Date of Birth: 10/25/1931 CSN: [id number] Age: 82 Admit Type: Outpatient Procedure:                ERCP Indications:              Bile duct stone(s), For therapy of bile duct                            stone(s), Elevated liver enzymes Providers:                Arta Silence, MD, Alinda Deem, RN, Elspeth Cho Tech., Technician, Virgia Land, CRNA Referring MD:              Medicines:                General Anesthesia, Cipro 400 mg IV, Solumedrol 125                            mg IV, Indomethacin 123XX123 mg PR Complications:            No immediate complications. Estimated Blood Loss:     Estimated blood loss was minimal. Estimated blood                            loss was minimal. Procedure:                Pre-Anesthesia Assessment:                           - Prior to the procedure, a History and Physical                            was performed, and patient medications and                            allergies were reviewed. The patient's tolerance of                            previous anesthesia was also reviewed. The risks                            and benefits of the procedure and the sedation                            options and risks were discussed with the patient.                            All questions were answered, and informed consent                            was obtained. Prior Anticoagulants: The patient has  taken no previous anticoagulant or antiplatelet                            agents. ASA Grade Assessment: III - A patient with                            severe systemic disease. After reviewing the risks                            and benefits, the patient was deemed in                            satisfactory condition to undergo the procedure.                           -  Prior to the procedure, a History and Physical                            was performed, and patient medications and                            allergies were reviewed. The patient's tolerance of                            previous anesthesia was also reviewed. The risks                            and benefits of the procedure and the sedation                            options and risks were discussed with the patient.                            All questions were answered, and informed consent                            was obtained. Prior Anticoagulants: The patient has                            taken no previous anticoagulant or antiplatelet                            agents. ASA Grade Assessment: III - A patient with                            severe systemic disease. After reviewing the risks                            and benefits, the patient was deemed in                            satisfactory condition to undergo the procedure.  After obtaining informed consent, the scope was                            passed under direct vision. Throughout the                            procedure, the patient's blood pressure, pulse, and                            oxygen saturations were monitored continuously. The                            [id number]) scope was introduced through                            the mouth, and used to inject contrast into and                            used to cannulate the bile duct. The ERCP was                            accomplished without difficulty. The patient                            tolerated the procedure well. Scope In: Scope Out: Findings:      The scout film was normal. The major papilla was normal. A wire was       passed into the biliary tree. The bile duct was then deeply cannulated       over the guidewire. Contrast was injected. I personally interpreted the       bile duct images. The flow of contrast through  the ducts was poor. The       common bile duct was moderately dilated. The main bile duct contained       multiple stones, the largest of which was 10 mm in diameter. A 10 mm       biliary sphincterotomy was made with a traction (standard)       sphincterotome using blended current. Moderate bleeding from the       sphincterotomy stopped within 5 minutes, with appilication of diluted       topical epinephrine and normal saline. There was suggestion of distal       stricture in the distal bile duct, through which I could not traverse       with basket catheter or cholangioscope. There was no clear way to expand       sphincterotomy or proceed with stone removal (of her multiple large CBD       stones). In light of this, and suboptimal bile duct drainage, I elected       to place stent to facilitate drainage. One 8.5 Fr by 5 cm plastic stent       with a single external flap and a single internal flap was placed into       the common bile duct. The stent was in good position. Impression:               - The major papilla appeared normal. Moderate Sedation:  N/A- Per Anesthesia Care Recommendation:           - Avoid aspirin and nonsteroidal anti-inflammatory                            medicines for 2 weeks.                           - Clear liquid diet today.                           - Continue present medications.                           - Return to GI clinic in 4 weeks.                           - Repeat ERCP in 2-3 months for potential extension                            of sphincterotomy and reattempt at bile duct stone                            extraction.                           - Return to referring physician as previously                            scheduled. Procedure Code(s):        --- Professional ---                           [id number], Endoscopic retrograde                            cholangiopancreatography (ERCP); with placement of                             endoscopic stent into biliary or pancreatic duct,                            including pre- and post-dilation and guide wire                            passage, when performed, including sphincterotomy,                            when performed, each stent Diagnosis Code(s):        --- Professional ---                           K80.50, Calculus of bile duct without cholangitis                            or cholecystitis without obstruction  R74.8, Abnormal levels of other serum enzymes CPT copyright 2016 [employer]. All rights reserved. The codes documented in this report are preliminary and upon coder review may  be revised to meet current compliance requirements. Arta Silence, MD 10/11/2015 12:14:53 PM This report has been signed electronically. Number of Addenda: 0

## 2015-10-11 NOTE — H&P (Signed)
Patient interval history reviewed.  Patient examined again.  There has been no change from documented H/P dated 09/21/15 (scanned into chart from our office) except as documented above.  Assessment:  1.  Elevated LFTs. 2.  Possible CBD stone on CT scan.  Plan:  1.  Endoscopic ultrasound to more definitively assess for CBD stone. 2.  Risks (bleeding, infection, bowel perforation that could require surgery, sedation-related changes in cardiopulmonary systems), benefits (identification and possible treatment of source of symptoms, exclusion of certain causes of symptoms), and alternatives (watchful waiting, radiographic imaging studies, empiric medical treatment) of upper endoscopy with ultrasound (EUS) were explained to patient/family in detail and patient wishes to proceed. 3.  If CBD stone is seen, would pursue same-day ERCP with biliary sphincterotomy and stone extraction today. 4.  Risks (up to and including bleeding, infection, perforation, pancreatitis that can be complicated by infected necrosis and death), benefits (removal of stones, alleviating blockage, decreasing risk of cholangitis or choledocholithiasis-related pancreatitis), and alternatives (watchful waiting, percutaneous transhepatic cholangiography) of ERCP were explained to patient/family in detail and patient elects to proceed.

## 2015-10-11 NOTE — Discharge Instructions (Signed)
Endoscopic Retrograde Cholangiopancreatography (ERCP) ERCP stands for endoscopic retrograde cholangiopancreatography. In this procedure, a thin, lighted tube (endoscope) is used. It is passed through the mouth and down the back of the throat into the upper part of the intestine, called the duodenum. A small, plastic tube (cannula) is then passed through the endoscope. It is directed into the bile duct or pancreatic duct. Dye is then injected through the tube. X-rays are taken to study the biliary and pancreatic passageways. This procedure is used to diagnosis many diseases of the pancreas, bile ducts, liver, and gallbladder. LET YOUR CAREGIVER KNOW ABOUT:   Allergies to food or medicine.   Medicines taken, including vitamins, herbs, eyedrops, over-the-counter medicines, and creams.   Use of steroids (by mouth or creams).   Previous problems with anesthetics or numbing medicines.   History of bleeding problems or blood clots.   Previous surgery.   Other health problems, including diabetes and kidney problems.   Possibility of pregnancy, if this applies.   Any barium X-rays during the past week.  BEFORE THE PROCEDURE   Do not eat or drink anything, including water, for at least 6 hours before the procedure.   Ask your caregiver whether you should stop taking certain medicines prior to your procedure.   Arrange for someone to drive you home. You will not be allowed to drive for several hours after the procedure.   Arrive at least 60 minutes before the procedure or as directed. This will give you time to check in and fill out any necessary paperwork.  PROCEDURE   You will be given medicine through a vein (intravenously) to make you relaxed and sleepy.   You might have a breathing tube placed to give you medicine that makes you sleep (general anesthetic).   Your throat may be sprayed with medicine that numbs the area (local anesthetic).   You will lie on your left side. The endoscope  will be inserted through your mouth and into the duodenum. The tube will not interfere with your breathing. Gagging is prevented by the anesthesia.   While X-rays are being taken, you may be positioned on your stomach.  During the ERCP, the person performing the procedure may identify a blockage or narrowed opening to the bile duct. A muscular portion of the main bile duct may be partially cut (sphincterotomy). A thin, plastic tube (stent) will be positioned inside your bile duct. This is to allow fluid secreted by the liver (bile) to flow more easily through the narrowed opening. AFTER THE PROCEDURE   You will rest in bed until you are fully conscious.   When you first wake up, your throat may feel slightly sore.   You will not be allowed to eat or drink until numbness subsides.   Once you are able to drink, urinate, and sit on the edge of the bed without feeling sick to your stomach (nauseous) or dizzy, you may be allowed to go home.   Have a friend or family member with you for the first 24 hours after your procedure.   NO antiinflammatories (e.g., aspirin, ibuprofen, naproxen, Alleve, Goody's, BC powders, Motrin) for ONE WEEK. SEEK MEDICAL CARE IF:   You have an oral temperature above 102 F (38.9 C).   You develop other signs of infection, including chills or feeling unwell.   You have abdominal pain.   You have questions or concerns.  MAKE SURE YOU:   Understand these instructions.   Will watch your condition.  Will get help right away if you are not doing well or get worse.  Document Released: 11/13/2000 Document Revised: 10/31/2010 Document Reviewed: 06/06/2009 [facility] Patient Information 2012 [place].

## 2015-10-11 NOTE — Anesthesia Procedure Notes (Signed)
Procedure Name: Intubation Date/Time: 10/11/2015 10:35 AM Performed by: Maxwell Caul Pre-anesthesia Checklist: Patient identified, Emergency Drugs available, Suction available and Patient being monitored Patient Re-evaluated:Patient Re-evaluated prior to inductionOxygen Delivery Method: Circle system utilized Preoxygenation: Pre-oxygenation with 100% oxygen Intubation Type: IV induction Ventilation: Mask ventilation without difficulty Laryngoscope Size: Glidescope and 3 Grade View: Grade I Tube type: Oral Tube size: 7.0 mm Number of attempts: 1 Airway Equipment and Method: Stylet Placement Confirmation: ETT inserted through vocal cords under direct vision,  positive ETCO2 and breath sounds checked- equal and bilateral Secured at: 21 cm Tube secured with: Tape Dental Injury: Teeth and Oropharynx as per pre-operative assessment  Difficulty Due To: Difficulty was anticipated and Difficult Airway- due to limited oral opening Comments: Elective Glidescope intubation due to very limited mouth opening. DL X1 with Glidescope #3 and ETT 7.0 placed with ease.

## 2015-10-11 NOTE — Anesthesia Postprocedure Evaluation (Signed)
Anesthesia Post Note  Patient: Jenna [name]  Procedure(s) Performed: Procedure(s) (LRB): ESOPHAGEAL ENDOSCOPIC ULTRASOUND (EUS) RADIAL (N/A) ENDOSCOPIC RETROGRADE CHOLANGIOPANCREATOGRAPHY (ERCP) (N/A)  Patient location during evaluation: PACU Anesthesia Type: General Level of consciousness: awake and alert Pain management: pain level controlled Vital Signs Assessment: post-procedure vital signs reviewed and stable Respiratory status: spontaneous breathing, nonlabored ventilation and respiratory function stable Cardiovascular status: blood pressure returned to baseline and stable Postop Assessment: no signs of nausea or vomiting Anesthetic complications: no    Last Vitals:  Vitals:   10/11/15 0832  BP: (!) 184/87  Pulse: 69  Resp: 16  Temp: 36.4 C    Last Pain:  Vitals:   10/11/15 0832  TempSrc: Oral                 Nilda Simmer

## 2015-10-12 ENCOUNTER — Encounter (HOSPITAL_COMMUNITY): Payer: Self-pay | Admitting: Gastroenterology

## 2015-10-18 DIAGNOSIS — R748 Abnormal levels of other serum enzymes: Secondary | ICD-10-CM | POA: Diagnosis not present

## 2015-10-31 DIAGNOSIS — R198 Other specified symptoms and signs involving the digestive system and abdomen: Secondary | ICD-10-CM | POA: Diagnosis not present

## 2015-10-31 DIAGNOSIS — K8050 Calculus of bile duct without cholangitis or cholecystitis without obstruction: Secondary | ICD-10-CM | POA: Diagnosis not present

## 2015-10-31 DIAGNOSIS — R945 Abnormal results of liver function studies: Secondary | ICD-10-CM | POA: Diagnosis not present

## 2015-11-06 DIAGNOSIS — K831 Obstruction of bile duct: Secondary | ICD-10-CM | POA: Diagnosis not present

## 2015-11-15 DIAGNOSIS — I700 Atherosclerosis of aorta: Secondary | ICD-10-CM | POA: Diagnosis not present

## 2015-11-15 DIAGNOSIS — I2510 Atherosclerotic heart disease of native coronary artery without angina pectoris: Secondary | ICD-10-CM | POA: Diagnosis not present

## 2015-11-15 DIAGNOSIS — I10 Essential (primary) hypertension: Secondary | ICD-10-CM | POA: Diagnosis not present

## 2015-11-15 DIAGNOSIS — F419 Anxiety disorder, unspecified: Secondary | ICD-10-CM | POA: Diagnosis not present

## 2015-11-15 DIAGNOSIS — E7800 Pure hypercholesterolemia, unspecified: Secondary | ICD-10-CM | POA: Diagnosis not present

## 2015-11-15 DIAGNOSIS — Z23 Encounter for immunization: Secondary | ICD-10-CM | POA: Diagnosis not present

## 2015-11-15 DIAGNOSIS — F324 Major depressive disorder, single episode, in partial remission: Secondary | ICD-10-CM | POA: Diagnosis not present

## 2015-11-28 DIAGNOSIS — Z853 Personal history of malignant neoplasm of breast: Secondary | ICD-10-CM | POA: Diagnosis not present

## 2015-12-01 ENCOUNTER — Encounter (HOSPITAL_COMMUNITY): Payer: Self-pay | Admitting: *Deleted

## 2015-12-01 ENCOUNTER — Other Ambulatory Visit: Payer: Self-pay | Admitting: Gastroenterology

## 2015-12-06 ENCOUNTER — Ambulatory Visit (HOSPITAL_COMMUNITY)
Admission: RE | Admit: 2015-12-06 | Discharge: 2015-12-06 | Disposition: A | Discharge status: Home / Self Care | Payer: PPO | Source: Ambulatory Visit | Attending: Gastroenterology | Admitting: Gastroenterology

## 2015-12-06 ENCOUNTER — Ambulatory Visit (HOSPITAL_COMMUNITY): Payer: PPO | Admitting: Certified Registered"

## 2015-12-06 ENCOUNTER — Encounter (HOSPITAL_COMMUNITY)
Admission: RE | Disposition: A | Discharge status: Home / Self Care | Payer: Self-pay | Source: Ambulatory Visit | Attending: Gastroenterology

## 2015-12-06 ENCOUNTER — Ambulatory Visit (HOSPITAL_COMMUNITY): Payer: PPO

## 2015-12-06 ENCOUNTER — Encounter (HOSPITAL_COMMUNITY): Payer: Self-pay

## 2015-12-06 DIAGNOSIS — R748 Abnormal levels of other serum enzymes: Secondary | ICD-10-CM | POA: Diagnosis not present

## 2015-12-06 DIAGNOSIS — I10 Essential (primary) hypertension: Secondary | ICD-10-CM | POA: Diagnosis not present

## 2015-12-06 DIAGNOSIS — Z853 Personal history of malignant neoplasm of breast: Secondary | ICD-10-CM | POA: Insufficient documentation

## 2015-12-06 DIAGNOSIS — K831 Obstruction of bile duct: Secondary | ICD-10-CM | POA: Diagnosis not present

## 2015-12-06 DIAGNOSIS — M810 Age-related osteoporosis without current pathological fracture: Secondary | ICD-10-CM | POA: Insufficient documentation

## 2015-12-06 DIAGNOSIS — K838 Other specified diseases of biliary tract: Secondary | ICD-10-CM | POA: Diagnosis not present

## 2015-12-06 DIAGNOSIS — E785 Hyperlipidemia, unspecified: Secondary | ICD-10-CM | POA: Insufficient documentation

## 2015-12-06 DIAGNOSIS — K8020 Calculus of gallbladder without cholecystitis without obstruction: Secondary | ICD-10-CM

## 2015-12-06 DIAGNOSIS — R855 Abnormal microbiological findings in specimens from digestive organs and abdominal cavity: Secondary | ICD-10-CM | POA: Diagnosis not present

## 2015-12-06 DIAGNOSIS — Z4659 Encounter for fitting and adjustment of other gastrointestinal appliance and device: Secondary | ICD-10-CM | POA: Diagnosis not present

## 2015-12-06 DIAGNOSIS — K8051 Calculus of bile duct without cholangitis or cholecystitis with obstruction: Secondary | ICD-10-CM | POA: Diagnosis not present

## 2015-12-06 DIAGNOSIS — K8050 Calculus of bile duct without cholangitis or cholecystitis without obstruction: Secondary | ICD-10-CM | POA: Diagnosis not present

## 2015-12-06 HISTORY — DX: Calculus of bile duct without cholangitis or cholecystitis without obstruction: K80.50

## 2015-12-06 HISTORY — PX: ERCP: SHX5425

## 2015-12-06 SURGERY — ERCP, WITH INTERVENTION IF INDICATED
Anesthesia: General

## 2015-12-06 MED ORDER — GLUCAGON HCL RDNA (DIAGNOSTIC) 1 MG IJ SOLR
INTRAMUSCULAR | Status: AC
  Filled 2015-12-06: qty 1

## 2015-12-06 MED ORDER — INDOMETHACIN 50 MG RE SUPP
RECTAL | Status: DC | PRN
  Administered 2015-12-06: 100 mg via RECTAL

## 2015-12-06 MED ORDER — LACTATED RINGERS IV SOLN
INTRAVENOUS | Status: DC
  Administered 2015-12-06: 10:00:00 via INTRAVENOUS

## 2015-12-06 MED ORDER — CIPROFLOXACIN IN D5W 400 MG/200ML IV SOLN
INTRAVENOUS | Status: AC
  Filled 2015-12-06: qty 200

## 2015-12-06 MED ORDER — CIPROFLOXACIN IN D5W 400 MG/200ML IV SOLN
INTRAVENOUS | Status: DC | PRN
  Administered 2015-12-06: 400 mg via INTRAVENOUS

## 2015-12-06 MED ORDER — METHYLPREDNISOLONE SODIUM SUCC 125 MG IJ SOLR
125.0000 mg | INTRAMUSCULAR | Status: AC
  Administered 2015-12-06: 125 mg via INTRAVENOUS
  Filled 2015-12-06: qty 2

## 2015-12-06 MED ORDER — SUCCINYLCHOLINE CHLORIDE 200 MG/10ML IV SOSY
PREFILLED_SYRINGE | INTRAVENOUS | Status: DC | PRN
  Administered 2015-12-06: 100 mg via INTRAVENOUS

## 2015-12-06 MED ORDER — SUCCINYLCHOLINE CHLORIDE 20 MG/ML IJ SOLN
INTRAMUSCULAR | Status: AC
  Filled 2015-12-06: qty 1

## 2015-12-06 MED ORDER — SODIUM CHLORIDE 0.9 % IV SOLN: INTRAVENOUS | Status: DC

## 2015-12-06 MED ORDER — ONDANSETRON HCL 4 MG/2ML IJ SOLN
INTRAMUSCULAR | Status: AC
  Filled 2015-12-06: qty 2

## 2015-12-06 MED ORDER — ONDANSETRON HCL 4 MG/2ML IJ SOLN
INTRAMUSCULAR | Status: DC | PRN
  Administered 2015-12-06: 4 mg via INTRAVENOUS

## 2015-12-06 MED ORDER — FENTANYL CITRATE (PF) 100 MCG/2ML IJ SOLN
INTRAMUSCULAR | Status: DC | PRN
  Administered 2015-12-06 (×2): 50 ug via INTRAVENOUS

## 2015-12-06 MED ORDER — SODIUM CHLORIDE 0.9 % IV SOLN
INTRAVENOUS | Status: DC | PRN
  Administered 2015-12-06: 100 mL

## 2015-12-06 MED ORDER — PROPOFOL 10 MG/ML IV BOLUS
INTRAVENOUS | Status: DC | PRN
  Administered 2015-12-06: 120 mg via INTRAVENOUS

## 2015-12-06 MED ORDER — PROPOFOL 10 MG/ML IV BOLUS
INTRAVENOUS | Status: AC
  Filled 2015-12-06: qty 20

## 2015-12-06 MED ORDER — INDOMETHACIN 50 MG RE SUPP
RECTAL | Status: AC
  Filled 2015-12-06: qty 2

## 2015-12-06 MED ORDER — LIDOCAINE 2% (20 MG/ML) 5 ML SYRINGE
INTRAMUSCULAR | Status: DC | PRN
  Administered 2015-12-06: 20 mg via INTRAVENOUS

## 2015-12-06 MED ORDER — FENTANYL CITRATE (PF) 100 MCG/2ML IJ SOLN
INTRAMUSCULAR | Status: AC
  Filled 2015-12-06: qty 2

## 2015-12-06 NOTE — Transfer of Care (Signed)
Immediate Anesthesia Transfer of Care Note  Patient: Jenna [name]  Procedure(s) Performed: Procedure(s) with comments: ENDOSCOPIC RETROGRADE CHOLANGIOPANCREATOGRAPHY (ERCP) (N/A) - Dr. Watt Climes to assist SPYGLASS CHOLANGIOSCOPY (N/A) SPYGLASS LITHOTRIPSY (N/A)  Patient Location: PACU  Anesthesia Type:General  Level of Consciousness:  sedated, patient cooperative and responds to stimulation  Airway & Oxygen Therapy:Patient Spontanous Breathing and Patient connected to face mask oxgen  Post-op Assessment:  Report given to PACU RN and Post -op Vital signs reviewed and stable  Post vital signs:  Reviewed and stable  Last Vitals:  Vitals:   12/06/15 0926  BP: (!) 194/68  Pulse: 69  Resp: 19  Temp: [id number] C    Complications: No apparent anesthesia complications

## 2015-12-06 NOTE — Anesthesia Procedure Notes (Signed)
Procedure Name: Intubation Date/Time: 12/06/2015 10:44 AM Performed by: Lajuana Carry E Pre-anesthesia Checklist: Patient identified, Emergency Drugs available, Suction available and Patient being monitored Patient Re-evaluated:Patient Re-evaluated prior to inductionOxygen Delivery Method: Circle system utilized Preoxygenation: Pre-oxygenation with 100% oxygen Intubation Type: IV induction Ventilation: Mask ventilation without difficulty Laryngoscope Size: Glidescope and 3 Grade View: Grade I Tube type: Oral Tube size: 7.0 mm Number of attempts: 1 Airway Equipment and Method: Stylet and Video-laryngoscopy Placement Confirmation: ETT inserted through vocal cords under direct vision,  positive ETCO2 and breath sounds checked- equal and bilateral Secured at: 22 cm Tube secured with: Tape Dental Injury: Teeth and Oropharynx as per pre-operative assessment  Difficulty Due To: Difficulty was anticipated and Difficult Airway- due to limited oral opening Comments: Elective glidescope d/t limited oral opening. Teeth/lips as preop.

## 2015-12-06 NOTE — Anesthesia Preprocedure Evaluation (Signed)
Anesthesia Evaluation  Patient identified by MRN, date of birth, ID band Patient awake    Reviewed: Allergy & Precautions, NPO status , Patient's Chart, lab work & pertinent test results, reviewed documented beta blocker date and time   History of Anesthesia Complications Negative for: history of anesthetic complications  Airway Mallampati: IV  TM Distance: >3 FB Neck ROM: Full  Mouth opening: Limited Mouth Opening  Dental  (+) Teeth Intact, Dental Advisory Given   Pulmonary neg pulmonary ROS, neg shortness of breath, neg sleep apnea, neg COPD, neg recent URI,    Pulmonary exam normal breath sounds clear to auscultation       Cardiovascular hypertension, Pt. on medications and Pt. on home beta blockers (-) angina(-) Past MI, (-) Cardiac Stents and (-) CABG  Rhythm:Regular Rate:Normal  TTE 01/31/2015: Study Conclusions  - Left ventricle: The cavity size was normal. Wall thickness was   normal. Systolic function was vigorous. The estimated ejection   fraction was in the range of 65% to 70%. Wall motion was normal;   there were no regional wall motion abnormalities. Doppler   parameters are consistent with abnormal left ventricular   relaxation (grade 1 diastolic dysfunction).   Neuro/Psych PSYCHIATRIC DISORDERS Anxiety negative neurological ROS     GI/Hepatic negative GI ROS, Elevated LFTs   Endo/Other  negative endocrine ROS  Renal/GU negative Renal ROS     Musculoskeletal osteoporosis   Abdominal   Peds  Hematology negative hematology ROS (+)   Anesthesia Other Findings HLD, h/o right breast cancer in 2012  Reproductive/Obstetrics                             Anesthesia Physical  Anesthesia Plan  ASA: II  Anesthesia Plan: General   Post-op Pain Management:    Induction: Intravenous  Airway Management Planned: Oral ETT and Video Laryngoscope Planned  Additional Equipment:    Intra-op Plan:   Post-operative Plan: Extubation in OR  Informed Consent: I have reviewed the patients History and Physical, chart, labs and discussed the procedure including the risks, benefits and alternatives for the proposed anesthesia with the patient or authorized representative who has indicated his/her understanding and acceptance.   Dental advisory given  Plan Discussed with:   Anesthesia Plan Comments:         Anesthesia Quick Evaluation

## 2015-12-06 NOTE — H&P (Signed)
Patient interval history reviewed.  Patient examined again.  There has been no change from documented H/P dated 12/01/15 (scanned into chart from our office) except as documented above.  Assessment:  1.  Bile duct stones. 2.  Bile duct stricture.  Plan:  1.  Endoscopic retrograde cholangiopancreatography with stent removal, possible stent replacement, possible lithotripsy, possible stone removal. 2.  Risks (up to and including bleeding, infection, perforation, pancreatitis that can be complicated by infected necrosis and death), benefits (removal of stones, alleviating blockage, decreasing risk of cholangitis or choledocholithiasis-related pancreatitis), and alternatives (watchful waiting, percutaneous transhepatic cholangiography) of ERCP were explained to patient/family in detail and patient elects to proceed.

## 2015-12-06 NOTE — Discharge Instructions (Signed)

## 2015-12-06 NOTE — Op Note (Signed)
[facility] Patient Name: Jenna [name] Procedure Date: 12/06/2015 MRN: [id number] Attending MD: Arta Silence , MD Date of Birth: August 02, 1931 CSN: [id number] Age: 82 Admit Type: Outpatient Procedure:                ERCP Indications:              Suspected bile duct stone(s), Elevated liver enzymes Providers:                Arta Silence, MD, Hilma Favors, RN, Cherylynn Ridges, Technician, Lajuana Carry, CRNA Referring MD:             Maury Dus, MD Medicines:                General Anesthesia, Indomethacin 100 mg PR, Cipro                            400 mg IV, Solumedrol 0000000 mg IV Complications:            No immediate complications. Estimated Blood Loss:     Estimated blood loss was minimal. Procedure:                Pre-Anesthesia Assessment:                           - Prior to the procedure, a History and Physical                            was performed, and patient medications and                            allergies were reviewed. The patient's tolerance of                            previous anesthesia was also reviewed. The risks                            and benefits of the procedure and the sedation                            options and risks were discussed with the patient.                            All questions were answered, and informed consent                            was obtained. Prior Anticoagulants: The patient has                            taken no previous anticoagulant or antiplatelet                            agents. ASA Grade Assessment: II - A patient with  mild systemic disease. After reviewing the risks                            and benefits, the patient was deemed in                            satisfactory condition to undergo the procedure.                           After obtaining informed consent, the scope was                            passed under direct vision. Throughout  the                            procedure, the patient's blood pressure, pulse, and                            oxygen saturations were monitored continuously. The                            [id number]) scope was introduced through                            the mouth, and used to inject contrast into and                            used to inject contrast into the bile duct. The                            ERCP was accomplished without difficulty. The                            patient tolerated the procedure well. Scope In: Scope Out: Findings:      A biliary stent was visible on the scout film. Ampulla neighboring small       diverticulum, and showed evidence of indwelling biliary stent and prior       biliary sphincterotomy. One stent was removed from the common bile duct       using a snare, and sent for cytology. The bile duct was deeply       cannulated with the traction (standard) sphincterotome. Contrast was       injected. I personally interpreted the bile duct images. Ductal flow of       contrast was adequate. Image quality was adequate. Contrast extended to       the main bile duct. Contrast extended to the gallbladder. Contrast       extended to the hepatic ducts. The middle third of the main bile duct       was mildly dilated. The lower third of the main bile duct contained some       debris, which was removed with balloon catheter. There was small       periampullary duodenal diverticulum. A 10 mm previously performed       sphincterotomy was extended another 2mm with a traction (standard)  sphincterotome using blended current. The sphincterotomy oozed blood,       which resolved by the end of the procedure. There was some mild       resistance at ampullary orifice; Dr. Watt Climes assisted and performed       balloon dilatation of the ampulla to 58mm. Post ampullary dilatation,       there was brisk bile flow through the ampulla. Post-occlusion       cholangiogram  showed no residual filling defects. Impression:               - The middle third of the main bile duct was mildly                            dilated.                           - Choledocholithiasis was found. Removal by biliary                            sphincterotomy was not accomplished; no stent was                            inserted.                           - One stent was removed from the common bile duct.                           - A biliary sphincterotomy was extended.                           - Biliary debris removed from bile duct. Moderate Sedation:      None Recommendation:           - Avoid aspirin and nonsteroidal anti-inflammatory                            medicines for 5 days.                           - Follow-up pathology results (biliary stent for                            cytology).                           - Discharge patient to home (via wheelchair).                           - Continue present medications.                           - Return to GI clinic PRN.                           - Return to referring physician as previously  scheduled. Procedure Code(s):        --- Professional ---                           [phone], Endoscopic retrograde                            cholangiopancreatography (ERCP); with removal of                            foreign body(s) or stent(s) from biliary/pancreatic                            duct(s)                           43264, Endoscopic retrograde                            cholangiopancreatography (ERCP); with removal of                            calculi/debris from biliary/pancreatic duct(s)                           43262, Endoscopic retrograde                            cholangiopancreatography (ERCP); with                            sphincterotomy/papillotomy Diagnosis Code(s):        --- Professional ---                           K80.50, Calculus of bile duct without cholangitis                             or cholecystitis without obstruction                           Z46.59, Encounter for fitting and adjustment of                            other gastrointestinal appliance and device                           R74.8, Abnormal levels of other serum enzymes                           K83.8, Other specified diseases of biliary tract CPT copyright 2016 American Medical Association. All rights reserved. The codes documented in this report are preliminary and upon coder review may  be revised to meet current compliance requirements. Arta Silence, MD 12/06/2015 11:49:39 AM This report has been signed electronically. Number of Addenda: 0

## 2015-12-06 NOTE — Progress Notes (Signed)
PROCEDURE NOTE  12/06/2015  2:49 PM  PATIENT:  Jenna [name]  82 y.o. female  PRE-OPERATIVE DIAGNOSIS:  CBD stones  POST-OPERATIVE DIAGNOSIS: Same  PROCEDURE: I assisted Dr. Paulita Fujita with the procedure and at the end of the procedure I proceeded with a sphincterotomy and balloon dilation using the 4 cm x 10 French balloon in the customary fashion with excellent results and excellent biliary drainage and no obvious residual stones were seen and the balloon and the wire were removed as was discussed at the end of the procedure  SURGEON:  Surgeon(s): Arta Silence, MD Clarene Essex, MD  ASSESSMENT/FINDINGS:  Removal of stent and  CBD stones and sphincterotomy and balloon pull-through by Dr. Paulita Fujita and repeat balloon pull-through and dilation as above by me PLAN OF CARE: Customary post-ERCP orders observe for delayed complications consider cholecystectomy and Dr. Paulita Fujita to follow-up when necessary

## 2015-12-06 NOTE — Anesthesia Postprocedure Evaluation (Signed)
Anesthesia Post Note  Patient: MARKEYTA GOODFELLOW  Procedure(s) Performed: Procedure(s) (LRB): ENDOSCOPIC RETROGRADE CHOLANGIOPANCREATOGRAPHY (ERCP) (N/A) SPYGLASS CHOLANGIOSCOPY (N/A) SPYGLASS LITHOTRIPSY (N/A)  Patient location during evaluation: Endoscopy Anesthesia Type: General Level of consciousness: awake and alert Pain management: pain level controlled Vital Signs Assessment: post-procedure vital signs reviewed and stable Respiratory status: spontaneous breathing, nonlabored ventilation, respiratory function stable and patient connected to nasal cannula oxygen Cardiovascular status: blood pressure returned to baseline and stable Postop Assessment: no signs of nausea or vomiting Anesthetic complications: no    Last Vitals:  Vitals:   12/06/15 1220 12/06/15 1230  BP: (!) 176/74 (!) 191/79  Pulse: (!) 59 62  Resp: 11 13  Temp:      Last Pain:  Vitals:   12/06/15 1144  TempSrc: Oral                 Montez Hageman

## 2015-12-07 ENCOUNTER — Encounter (HOSPITAL_COMMUNITY): Payer: Self-pay | Admitting: Gastroenterology

## 2015-12-25 ENCOUNTER — Other Ambulatory Visit: Payer: Self-pay | Admitting: *Deleted

## 2015-12-25 DIAGNOSIS — C50411 Malignant neoplasm of upper-outer quadrant of right female breast: Secondary | ICD-10-CM

## 2015-12-26 ENCOUNTER — Ambulatory Visit (HOSPITAL_BASED_OUTPATIENT_CLINIC_OR_DEPARTMENT_OTHER): Payer: PPO | Admitting: Oncology

## 2015-12-26 ENCOUNTER — Other Ambulatory Visit (HOSPITAL_BASED_OUTPATIENT_CLINIC_OR_DEPARTMENT_OTHER): Payer: PPO

## 2015-12-26 VITALS — BP 116/50 | HR 84 | Temp 97.5°F | Resp 18 | Ht 59.0 in | Wt 102.5 lb

## 2015-12-26 DIAGNOSIS — Z853 Personal history of malignant neoplasm of breast: Secondary | ICD-10-CM | POA: Diagnosis not present

## 2015-12-26 DIAGNOSIS — Z171 Estrogen receptor negative status [ER-]: Principal | ICD-10-CM

## 2015-12-26 DIAGNOSIS — C50411 Malignant neoplasm of upper-outer quadrant of right female breast: Secondary | ICD-10-CM

## 2015-12-26 LAB — COMPREHENSIVE METABOLIC PANEL
ALT: 22 U/L (ref 0–55)
ANION GAP: 11 meq/L (ref 3–11)
AST: 59 U/L — ABNORMAL HIGH (ref 5–34)
Albumin: 2.1 g/dL — ABNORMAL LOW (ref 3.5–5.0)
Alkaline Phosphatase: 892 U/L — ABNORMAL HIGH (ref 40–150)
BUN: 14 mg/dL (ref 7.0–26.0)
CALCIUM: 8.8 mg/dL (ref 8.4–10.4)
CHLORIDE: 101 meq/L (ref 98–109)
CO2: 23 meq/L (ref 22–29)
Creatinine: 0.8 mg/dL (ref 0.6–1.1)
EGFR: 66 mL/min/{1.73_m2} — AB (ref >90)
Glucose: 117 mg/dl (ref 70–140)
Potassium: 4 mEq/L (ref 3.5–5.1)
Sodium: 135 mEq/L — ABNORMAL LOW (ref 136–145)
Total Bilirubin: 2.81 mg/dL — ABNORMAL HIGH (ref 0.20–1.20)
Total Protein: 6.4 g/dL (ref 6.4–8.3)

## 2015-12-26 LAB — CBC WITH DIFFERENTIAL/PLATELET
BASO%: 0.5 % (ref 0.0–2.0)
BASOS ABS: 0 10*3/uL (ref 0.0–0.1)
EOS ABS: 0 10*3/uL (ref 0.0–0.5)
EOS%: 0.4 % (ref 0.0–7.0)
HCT: 36.8 % (ref 34.8–46.6)
HGB: 12.3 g/dL (ref 11.6–15.9)
LYMPH%: 5.2 % — AB (ref 14.0–49.7)
MCH: 27.5 pg (ref 25.1–34.0)
MCHC: 33.3 g/dL (ref 31.5–36.0)
MCV: 82.4 fL (ref 79.5–101.0)
MONO#: 0.6 10*3/uL (ref 0.1–0.9)
MONO%: 8.3 % (ref 0.0–14.0)
NEUT#: 6.4 10*3/uL (ref 1.5–6.5)
NEUT%: 85.6 % — AB (ref 38.4–76.8)
PLATELETS: 241 10*3/uL (ref 145–400)
RBC: 4.47 10*6/uL (ref 3.70–5.45)
RDW: 14.8 % — ABNORMAL HIGH (ref 11.2–14.5)
WBC: 7.5 10*3/uL (ref 3.9–10.3)
lymph#: 0.4 10*3/uL — ABNORMAL LOW (ref 0.9–3.3)

## 2015-12-26 NOTE — Progress Notes (Signed)
Jenna [name]  Telephone:([phone]) [phone] Fax:(336) [phone]  OFFICE PROGRESS NOTE   ID: Jenna [name]   DOB: 1932-01-08  MR#: [id number]  ZSW#:[id number]   PCP: Vena Austria, MD SU: Haywood Lasso, MD RAD ONC: Arloa Koh, MD  CHIEF COMPLAINT: Triple negative breast cancer  CURRENT TREATMENT: Observation   HISTORY OF PRESENT ILLNESS: From Dr. Collier Salina Rubin's New Patient Evaluation Note dated 11/07/2010:  "This is a delightful 82 year old woman from [facility] referred by Dr. Herbie Baltimore Read for evaluation of her recently diagnosed breast cancer.   Jenna [name] is here today in the multidisciplinary clinic.  She is accompanied by her daughter, Dorian Pod, and son, Sonia Side.  Jenna [name] has been in good health.  She undergoes annual screening mammography.  She had a screening mammogram on 10/19/2009 and this showed no abnormalities.  A follow up mammogram on 10/25/2010 showed an asymmetric density developing in the upper outer quadrant of the right breast.  No new abnormalities were seen.  Follow up views with ultrasound of the right breast was performed on 11/01/2010 and this confirmed the presence of a 1- x 1.3- x 1.5-cm irregular marginated nodular density with posterior acoustic shadowing.  The patient had a biopsy performed the same day.  This showed a moderately well-differentiated invasive ductal cancer.  This appeared to be ER and PR negative.  HER-2 at this point is still pending.  An MRI scan has been scheduled."    Her subsequent history is as detailed below.   INTERVAL HISTORY: Jenna [name] returns today for follow-up of her estrogen receptor positive breast cancer accompanied by her daughter.   REVIEW OF SYSTEMS: Jenna [name] tells me this has been a "terrible year". She had pneumonia, then sepsis, and was in the intensive care unit 12 days. She nearly died she says. Mo recently she had gallstones. She had multiple tests and procedures including upper endoscopies, MRI, and  ERCP, but they could not retrieve the stones. Stent was placed and eventually she did past the stones. She is now beginning to recover but she still feels severely fatigued. She has some diarrhea. Her appetite is poor. She bruises easily. She is anxious and depressed.  PAST MEDICAL HISTORY: Past Medical History:  Diagnosis Date  . Anxiety   . Breast cancer, IDC, Right, Stage I 11/07/2010   right breast lumpectomy surgery with radiation  . Constipation   . Gall stones, common bile duct   . Hypercholesteremia   . Hypertension   . Osteoporosis   . Pneumonia 2016  . Sepsis ([place]) 02/2015    PAST SURGICAL HISTORY: Past Surgical History:  Procedure Laterality Date  . BREAST LUMPECTOMY W/ NEEDLE LOCALIZATION  11/26/2010   Right - Dr Margot Chimes  . BREAST SURGERY    . ERCP N/A 10/11/2015   Procedure: ENDOSCOPIC RETROGRADE CHOLANGIOPANCREATOGRAPHY (ERCP);  Surgeon: Arta Silence, MD;  Location: [facility];  Service: Endoscopy;  Laterality: N/A;  . ERCP N/A 12/06/2015   Procedure: ENDOSCOPIC RETROGRADE CHOLANGIOPANCREATOGRAPHY (ERCP);  Surgeon: Arta Silence, MD;  Location: [facility];  Service: Endoscopy;  Laterality: N/A;  Dr. Watt Climes to assist  . EUS N/A 10/11/2015   Procedure: ESOPHAGEAL ENDOSCOPIC ULTRASOUND (EUS) RADIAL;  Surgeon: Arta Silence, MD;  Location: [facility] END[facility]COPY;  Service: Endoscopy;  Laterality: N/A;  . HEMORRHOID SURGERY    . TONSILLECTOMY    . TUMOR REMOVAL  1960, 1970   benign tumor from bilateral breast    FAMILY HISTORY Family History  Problem Relation Age of Onset  . Cancer Mother  lung  . Heart disease Mother   . Heart disease Father   . Heart disease Brother   . Heart disease Paternal Uncle   . Heart disease Maternal Grandmother   . Stroke Maternal Grandfather   . Heart disease Paternal Grandfather     GYNECOLOGIC HISTORY: She is G2, P2, menarche at age 64, menopause in 57.  Hormone replacement therapy taken for about 4 years.  SOCIAL HISTORY: Ms.  [name] is originally from [place], [place] and moved here when her [name] (who was from [place], [place]) married her.  He died when he was 59.  She now lives by herself.  Son, Sonia Side, lives in [place].  Daughter, Dorian Pod, has 4 children and lives in [place].  The patient is very active socially in the [place]. 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    ADVANCED DIRECTIVES:  in place  Health maintenance:  Social History  Substance Use Topics  . Smoking status: Never Smoker  . Smokeless tobacco: Never Used  . Alcohol use No    Colonoscopy: April 2014 PAP: 09/20/2010 Bone density:  Lipid panel: Not on file  Allergies  Allergen Reactions  . Iodine Itching and Rash    Iodine IV  . Lactose Intolerance (Gi) Diarrhea  . Sulfa Antibiotics Itching and Rash    Current Outpatient Prescriptions  Medication Sig Dispense Refill  . acetaminophen (TYLENOL) 500 MG tablet Take 500 mg by mouth every 6 (six) hours as needed.    Marland Kitchen atenolol (TENORMIN) 50 MG tablet Take 50 mg by mouth daily.     Marland Kitchen atorvastatin (LIPITOR) 40 MG tablet Take 40 mg by mouth daily. Reported on 06/22/2015    . cholecalciferol (VITAMIN D) 1000 UNITS tablet Take 1,000 Units by mouth daily.      Marland Kitchen LORazepam (ATIVAN) 1 MG tablet Take 0.5-1 mg by mouth 2 (two) times daily as needed for anxiety or sleep.     Marland Kitchen losartan (COZAAR) 100 MG tablet Take 100 mg by mouth daily.  1  . Misc Natural Products (LUTEIN 20 PO) Take 1 tablet by mouth daily. Reported on 06/22/2015    . senna (SENOKOT) 8.6 MG tablet Take 1-2 tablets by mouth at bedtime.     . sertraline (ZOLOFT) 100 MG tablet Take 100 mg by mouth daily.     No current facility-administered medications for this visit.     OBJECTIVE: Older white woman  Vitals:   12/26/15 1024  BP: (!) 116/50  Pulse: 84  Resp: 18  Temp: 97.5 F (36.4 C)     Body mass index is 20.7 kg/m.      ECOG FS: 0            Sclerae unicteric,  EOMs intact Oropharynx clear and moist No cervical or supraclavicular adenopathy Lungs no rales or rhonchi Heart regular rate and rhythm Abd soft, nontender, positive bowel sounds MSK no focal spinal tenderness, no upper extremity lymphedema Neuro: nonfocal, well oriented, Positive affect Breasts: The right breast is status post lumpectomy followed by radiation. There is no evidence of local recurrence. The right axilla is benign. The left breast is unremarkable   LAB RESULTS: Lab Results  Component Value Date   WBC 7.5 12/26/2015   NEUTROABS 6.4 12/26/2015   HGB 12.3 12/26/2015   HCT 36.8 12/26/2015   MCV 82.4 12/26/2015   PLT 241 12/26/2015      Chemistry      Component Value Date/Time  NA 135 (L) 12/26/2015 1000   K 4.0 12/26/2015 1000   CL 101 02/09/2015 0700   CL 99 11/07/2010 0825   CO2 23 12/26/2015 1000   BUN 14.0 12/26/2015 1000   CREATININE 0.8 12/26/2015 1000      Component Value Date/Time   CALCIUM 8.8 12/26/2015 1000   ALKPHOS 892 (H) 12/26/2015 1000   AST 59 (H) 12/26/2015 1000   ALT 22 12/26/2015 1000   BILITOT 2.81 (H) 12/26/2015 1000       Lab Results  Component Value Date   LABCA2 25 11/07/2010    Urinalysis    Component Value Date/Time   COLORURINE AMBER (A) 01/29/2015 1448    STUDIES: Dg Ercp Biliary & Pancreatic Ducts  Result Date: 12/06/2015 CLINICAL DATA:  82 year old female with a history of choledocholithiasis of prior stent EXAM: ERCP TECHNIQUE: Multiple spot images obtained with the fluoroscopic device and submitted for interpretation post-procedure. FLUOROSCOPY TIME:  5 minutes 18 seconds COMPARISON:  Prior fluoro study 10/11/2015, CT 09/15/2015 FINDINGS: Limited fluoroscopic spot images during ERCP. Initial image demonstrates endoscope over the upper abdomen with a plastic stent in position. Subsequently there is cannulation of the ampulla and retrograde infusion of contrast, partially opacifying the extrahepatic biliary system. A  balloon basket deployed on sequential images, with the final image demonstrating guidewire in place with the balloon at the ampulla. IMPRESSION: Limited images during ERCP demonstrates partial opacification of the extrahepatic biliary system with a balloon basket deployed. Please refer to the dictated operative report for full details of intraoperative findings and procedure. Signed, Dulcy Fanny. Earleen Newport, DO [facility] Electronically Signed   By: Corrie Mckusick D.O.   On: 12/06/2015 11:58   Mammography at [facility] 11/28/2015 found the breast density to be category B. There was no evidence of malignancy.  ASSESSMENT: 82 y.o. [place] woman:  1.  Status post right breast upper outer quadrant lumpectomy with sentinel node biopsy 11/26/2010 for a pT1c pN0, stage IA invasive ductal carcinoma, grade 1, triple-negative, Ki-67 35%.  2.  Status post radiation therapy from 01/15/2011 through 02/12/2011.  3.  Status post genetic testing with [facility] Personalized Breast Cancer Genomic Profile - breast cancer recurrence assay 70 gene signature prognostic and predictive tumor analysis shows high risk with a 10 year distant metastasis-free survival prior to treatment 71% (baseline risk or distant metastases 29%): These patients can expect their risk to be reduced with adjuvant chemotherapy.  However, the patient declined adjuvant chemotherapy.   PLAN: Jenna [name] is now 5 years out from definitive surgery for her breast cancer with no evidence of disease recurrence. This is very favorable.  Especially triple negative breast cancers, if they are to recur, then to recur early; for that reason I feel comfortable at this point releasing her to her primary care physician.  I'll she will need is yearly mammography and a yearly physician breast exam as far as breast cancer screening is concerned.  We discussed our survivorship program and she is very interested in  participating. I went ahead and made her an appointment. I will be glad to see Jenna [name] at any point in the future on an as-needed basis, but also now we are making no further routine appointments for her with me here.    Chauncey Cruel, MD   12/26/2015, 7:27 PM

## 2016-01-04 DIAGNOSIS — Z853 Personal history of malignant neoplasm of breast: Secondary | ICD-10-CM | POA: Diagnosis not present

## 2016-01-04 DIAGNOSIS — R634 Abnormal weight loss: Secondary | ICD-10-CM | POA: Diagnosis not present

## 2016-01-04 DIAGNOSIS — R159 Full incontinence of feces: Secondary | ICD-10-CM | POA: Diagnosis not present

## 2016-01-04 DIAGNOSIS — R531 Weakness: Secondary | ICD-10-CM | POA: Diagnosis not present

## 2016-01-05 ENCOUNTER — Other Ambulatory Visit: Payer: Self-pay | Admitting: Family Medicine

## 2016-01-05 ENCOUNTER — Ambulatory Visit
Admission: RE | Admit: 2016-01-05 | Discharge: 2016-01-05 | Disposition: A | Discharge status: Home / Self Care | Payer: PPO | Source: Ambulatory Visit | Attending: Family Medicine | Admitting: Family Medicine

## 2016-01-05 DIAGNOSIS — C50919 Malignant neoplasm of unspecified site of unspecified female breast: Secondary | ICD-10-CM | POA: Diagnosis not present

## 2016-01-05 DIAGNOSIS — Z853 Personal history of malignant neoplasm of breast: Secondary | ICD-10-CM

## 2016-01-05 DIAGNOSIS — M47816 Spondylosis without myelopathy or radiculopathy, lumbar region: Secondary | ICD-10-CM | POA: Diagnosis not present

## 2016-01-05 DIAGNOSIS — R159 Full incontinence of feces: Secondary | ICD-10-CM

## 2016-01-05 DIAGNOSIS — R531 Weakness: Secondary | ICD-10-CM

## 2016-01-05 DIAGNOSIS — R109 Unspecified abdominal pain: Secondary | ICD-10-CM | POA: Diagnosis not present

## 2016-01-09 ENCOUNTER — Other Ambulatory Visit: Payer: Self-pay | Admitting: Family Medicine

## 2016-01-09 DIAGNOSIS — N3289 Other specified disorders of bladder: Secondary | ICD-10-CM

## 2016-01-09 DIAGNOSIS — R634 Abnormal weight loss: Secondary | ICD-10-CM

## 2016-01-15 ENCOUNTER — Telehealth: Payer: Self-pay

## 2016-01-15 NOTE — Telephone Encounter (Signed)
Spoke with patient to let her know her 13-hour prep has been called in to [facility] [phone]).  I told her she will take Prednisone 50mg  PO on Wednesday, January 17, 2016 at [place], 0710 and 1310.  Also at 1310 she is to take Benadryl 50mg  PO.  jkl

## 2016-01-17 ENCOUNTER — Ambulatory Visit
Admission: RE | Admit: 2016-01-17 | Discharge: 2016-01-17 | Disposition: A | Discharge status: Home / Self Care | Payer: PPO | Source: Ambulatory Visit | Attending: Family Medicine | Admitting: Family Medicine

## 2016-01-17 DIAGNOSIS — F419 Anxiety disorder, unspecified: Secondary | ICD-10-CM | POA: Diagnosis not present

## 2016-01-17 DIAGNOSIS — R634 Abnormal weight loss: Secondary | ICD-10-CM | POA: Diagnosis not present

## 2016-01-17 DIAGNOSIS — N3289 Other specified disorders of bladder: Secondary | ICD-10-CM

## 2016-01-17 DIAGNOSIS — Z66 Do not resuscitate: Secondary | ICD-10-CM | POA: Diagnosis not present

## 2016-01-17 DIAGNOSIS — R933 Abnormal findings on diagnostic imaging of other parts of digestive tract: Secondary | ICD-10-CM | POA: Diagnosis not present

## 2016-01-17 DIAGNOSIS — R1900 Intra-abdominal and pelvic swelling, mass and lump, unspecified site: Secondary | ICD-10-CM | POA: Diagnosis not present

## 2016-01-17 DIAGNOSIS — I10 Essential (primary) hypertension: Secondary | ICD-10-CM | POA: Diagnosis not present

## 2016-01-17 DIAGNOSIS — R5383 Other fatigue: Secondary | ICD-10-CM | POA: Diagnosis not present

## 2016-01-17 DIAGNOSIS — I8500 Esophageal varices without bleeding: Secondary | ICD-10-CM | POA: Diagnosis not present

## 2016-01-17 DIAGNOSIS — K449 Diaphragmatic hernia without obstruction or gangrene: Secondary | ICD-10-CM | POA: Diagnosis not present

## 2016-01-17 DIAGNOSIS — K5790 Diverticulosis of intestine, part unspecified, without perforation or abscess without bleeding: Secondary | ICD-10-CM | POA: Diagnosis not present

## 2016-01-17 DIAGNOSIS — K222 Esophageal obstruction: Secondary | ICD-10-CM | POA: Diagnosis not present

## 2016-01-17 DIAGNOSIS — R7989 Other specified abnormal findings of blood chemistry: Secondary | ICD-10-CM | POA: Diagnosis not present

## 2016-01-17 DIAGNOSIS — K838 Other specified diseases of biliary tract: Secondary | ICD-10-CM | POA: Diagnosis not present

## 2016-01-17 DIAGNOSIS — K5730 Diverticulosis of large intestine without perforation or abscess without bleeding: Secondary | ICD-10-CM | POA: Diagnosis not present

## 2016-01-17 DIAGNOSIS — K56699 Other intestinal obstruction unspecified as to partial versus complete obstruction: Secondary | ICD-10-CM | POA: Diagnosis not present

## 2016-01-17 DIAGNOSIS — R918 Other nonspecific abnormal finding of lung field: Secondary | ICD-10-CM | POA: Diagnosis not present

## 2016-01-17 DIAGNOSIS — E7800 Pure hypercholesterolemia, unspecified: Secondary | ICD-10-CM | POA: Diagnosis not present

## 2016-01-17 DIAGNOSIS — R188 Other ascites: Secondary | ICD-10-CM | POA: Diagnosis not present

## 2016-01-17 DIAGNOSIS — R7881 Bacteremia: Secondary | ICD-10-CM | POA: Diagnosis not present

## 2016-01-17 DIAGNOSIS — K7290 Hepatic failure, unspecified without coma: Secondary | ICD-10-CM | POA: Diagnosis not present

## 2016-01-17 DIAGNOSIS — E876 Hypokalemia: Secondary | ICD-10-CM | POA: Diagnosis not present

## 2016-01-17 DIAGNOSIS — K7460 Unspecified cirrhosis of liver: Secondary | ICD-10-CM | POA: Diagnosis not present

## 2016-01-17 DIAGNOSIS — R140 Abdominal distension (gaseous): Secondary | ICD-10-CM | POA: Diagnosis not present

## 2016-01-17 DIAGNOSIS — R630 Anorexia: Secondary | ICD-10-CM | POA: Diagnosis not present

## 2016-01-17 DIAGNOSIS — R410 Disorientation, unspecified: Secondary | ICD-10-CM | POA: Diagnosis not present

## 2016-01-17 DIAGNOSIS — B9689 Other specified bacterial agents as the cause of diseases classified elsewhere: Secondary | ICD-10-CM | POA: Diagnosis not present

## 2016-01-17 DIAGNOSIS — K7291 Hepatic failure, unspecified with coma: Secondary | ICD-10-CM | POA: Diagnosis not present

## 2016-01-17 DIAGNOSIS — R5381 Other malaise: Secondary | ICD-10-CM | POA: Diagnosis not present

## 2016-01-17 DIAGNOSIS — J9811 Atelectasis: Secondary | ICD-10-CM | POA: Diagnosis not present

## 2016-01-17 DIAGNOSIS — K745 Biliary cirrhosis, unspecified: Secondary | ICD-10-CM | POA: Diagnosis not present

## 2016-01-17 DIAGNOSIS — K766 Portal hypertension: Secondary | ICD-10-CM | POA: Diagnosis not present

## 2016-01-17 DIAGNOSIS — Z853 Personal history of malignant neoplasm of breast: Secondary | ICD-10-CM | POA: Diagnosis not present

## 2016-01-17 MED ORDER — IOPAMIDOL (ISOVUE-300) INJECTION 61%
100.0000 mL | Freq: Once | INTRAVENOUS | Status: AC | PRN
  Administered 2016-01-17: 100 mL via INTRAVENOUS

## 2016-01-23 ENCOUNTER — Telehealth: Payer: Self-pay | Admitting: *Deleted

## 2016-01-23 NOTE — Telephone Encounter (Signed)
This RN was contacted by Horris Latino from [facility] with discharge planning regarding recent admission - " and will need to see Dr Jannifer Rodney upon discharge for her metastatic disease".  Per chart review noted per visit in October of this year pt was NED for history of breast cancer and was released from care under Dr Jana Hakim.  Per further review with Horris Latino including data per [facility]- noted area of concern in abdomen but no associated biopsy with pathology to determine if cancer - metastatic or new diagnosis. Verified that  Pt's current diagnosis is " decompensated cirrhosis with mild hepatic encephaloathy with probable bilary obstruction - to be discharged on IV antibiotic.  Informed Horris Latino of noted history of care under Dr Clarene Essex and follow up may need to be deferred to him if concern is cirrhosis or GI concerns.  This RN gave contact information for Dr Watt Climes as well as this RN's direct number if additional concerns need to be addressed by this office.  Horris Latino stated appreciation of above conversation and if needed will contact this RN. At present no follow upper Dr Jana Hakim is indicated.

## 2016-01-26 DIAGNOSIS — K7469 Other cirrhosis of liver: Secondary | ICD-10-CM | POA: Diagnosis not present

## 2016-01-29 DIAGNOSIS — K7469 Other cirrhosis of liver: Secondary | ICD-10-CM | POA: Diagnosis not present

## 2016-01-31 ENCOUNTER — Other Ambulatory Visit: Payer: Self-pay | Admitting: Oncology

## 2016-01-31 DIAGNOSIS — Z9889 Other specified postprocedural states: Secondary | ICD-10-CM | POA: Diagnosis not present

## 2016-01-31 DIAGNOSIS — Z853 Personal history of malignant neoplasm of breast: Secondary | ICD-10-CM | POA: Diagnosis not present

## 2016-01-31 DIAGNOSIS — R410 Disorientation, unspecified: Secondary | ICD-10-CM | POA: Diagnosis not present

## 2016-01-31 DIAGNOSIS — R634 Abnormal weight loss: Secondary | ICD-10-CM | POA: Diagnosis not present

## 2016-01-31 DIAGNOSIS — R600 Localized edema: Secondary | ICD-10-CM | POA: Diagnosis not present

## 2016-01-31 DIAGNOSIS — K8050 Calculus of bile duct without cholangitis or cholecystitis without obstruction: Secondary | ICD-10-CM | POA: Diagnosis not present

## 2016-01-31 DIAGNOSIS — K7460 Unspecified cirrhosis of liver: Secondary | ICD-10-CM | POA: Diagnosis not present

## 2016-01-31 DIAGNOSIS — K222 Esophageal obstruction: Secondary | ICD-10-CM | POA: Diagnosis not present

## 2016-01-31 DIAGNOSIS — R1900 Intra-abdominal and pelvic swelling, mass and lump, unspecified site: Secondary | ICD-10-CM | POA: Diagnosis not present

## 2016-01-31 DIAGNOSIS — R188 Other ascites: Secondary | ICD-10-CM | POA: Diagnosis not present

## 2016-01-31 DIAGNOSIS — R159 Full incontinence of feces: Secondary | ICD-10-CM | POA: Diagnosis not present

## 2016-01-31 DIAGNOSIS — I8510 Secondary esophageal varices without bleeding: Secondary | ICD-10-CM | POA: Diagnosis not present

## 2016-01-31 DIAGNOSIS — K5730 Diverticulosis of large intestine without perforation or abscess without bleeding: Secondary | ICD-10-CM | POA: Diagnosis not present

## 2016-01-31 DIAGNOSIS — K7290 Hepatic failure, unspecified without coma: Secondary | ICD-10-CM | POA: Diagnosis not present

## 2016-02-01 ENCOUNTER — Other Ambulatory Visit: Payer: Self-pay

## 2016-02-01 NOTE — Patient Outreach (Signed)
[facility]) Care Management  02/01/2016  Jenna [name] [birth year] [id number]   Telephone Screen  Referral Date: 01/31/16 Referral Source: [facility] Referral Reason: " ongoing education and disease management, date of last hospital d/c 01/25/16"    Outreach attempt # 1 to patient. No answer at present. RN CM left HIPAA compliant voicemail message along with contact info.       Plan: RN CM will make outreach attempt to patient within a week.    [name], RN,BSN,CCM [place] Management Telephonic Care Management Coordinator Direct Phone: [phone] Toll Free: [phone] Fax: [phone]'

## 2016-02-01 NOTE — Patient Outreach (Signed)
[place] [facility]) Care Management  02/01/2016  SHATARRA ARK 01/22/1932 [id number]   Telephone Screen  Referral Date: 01/31/16 Referral Source: [facility] Referral Reason: " ongoing education and disease management, date of last hospital d/c 01/25/16"    Voicemail message received from a female identifying himself as Sonia Side calling RN CM back for patient. Return call placed to patient. No answer at present.    Plan: RN CM will attempt outreach call to patient within a week.    [name], RN,BSN,CCM [place] Management Telephonic Care Management Coordinator Direct Phone: [phone] Toll Free: [phone] Fax: [phone]

## 2016-02-03 DIAGNOSIS — K7469 Other cirrhosis of liver: Secondary | ICD-10-CM | POA: Diagnosis not present

## 2016-02-05 ENCOUNTER — Other Ambulatory Visit: Payer: Self-pay

## 2016-02-05 ENCOUNTER — Other Ambulatory Visit: Payer: Self-pay | Admitting: *Deleted

## 2016-02-05 NOTE — Patient Outreach (Signed)
[facility] [facility]) Care Management  02/05/2016  Jenna [name] 07-01-1931 [id number]  RN attempted to contact pt today however unsuccessful. RN unable to  Leave a HIPAA approved voice message but will rescheduled for another call this week.  Raina Mina, RN Care Management Coordinator [place] Office [phone]

## 2016-02-05 NOTE — Patient Outreach (Signed)
[place] [facility]) Care Management  02/05/2016  Jenna [name] March 04, 1932 [id number]   Telephone Screen  Referral Date: 01/31/16 Referral Source: [place] Referral Reason: " ongoing education and disease management, date of last hospital d/c 01/25/16"      Outreach attempt #  2 to patient. Spoke with both patient and [name] per patient request. Screening completed.   Social: patient resides in her home. Her son is assisting with her care needs as she requires assistance with all ADLs/IADLs. Son is taking patient to MD appts. Patient reports at least two falls within the last year and the most recent one was last month. Son states that he is in the process of getting patient moved into an ALF. He has also selected the facility he wants and started the process. He reports he is waiting to patient is stronger enough physically to make the transition. He declined needing SW assistance with placement at this time. DME in the home include walker and scales.    Conditions: Patient has PMH of triple negative breast CA s/p radiation and lumpectomy, anxiety, HLD, HTN and osteoporosis. Patient was discharged from [facility] on 01/25/16 following diagnosis of decompensated cirrhosis w/ mild hepatic encephalopathy with probable bilary obstruction.' She reports that she had paracentesis done while inpatient and had 3L of fluid removed. She voices that since discharge home she continues to have " a lot of fluid buildup" to her ankles and belly. She reports that she is taking two diuretics currently. She has a scale in the home and is weighing QD. Weight this am was 101 lbs. Son also voices that she continues to have some "residual incontinence."   Medications: Patient reported taking about five meds. Son states that she was discharged home on new med for cirrhosis. He is unsure of name but state it starts with a 'Z." He reports that he has been told that patient will have  a large co-pay amt for this med and concerned that patient will not be able to afford it. He reports that patient's pharmacy [facility] has contacted patient's insurance plan to discuss med. Son inquiring if patient eligible for any medication assistance. Advised that RN CM would refer to [place] for possible further assistance.   Appointments: She has seen GI specialist at [facility] (Dr. Redmond Pulling) since discharge. Patient has not had PCP f/u and was not advised to do so. Long talk with patient and son regarding importance of PCP f/u visit following hospital admission. Son will f/u with PCP office today.    Advance Directives: Patient has living will and HCPOA. DNR in place per hospital records.    Consent: [facility] services reviewed and discussed. Patient and son gave verbal consent for [facility] services.    Plan: RN CM will notify [facility] administrative assistant of case status. RN CM will send [facility] community referral for further TOC in home eval and assessment of care needs. RN CM will send [facility] pharmacy referral for possible med assistance.  RN CM provided patient/caregiver with [facility] 24hr Nurse Line contact info.  [name], RN,BSN,CCM [place] Management Telephonic Care Management Coordinator Direct Phone: [phone] Toll Free: [phone] Fax: [phone]

## 2016-02-06 ENCOUNTER — Other Ambulatory Visit: Payer: Self-pay | Admitting: *Deleted

## 2016-02-06 NOTE — Patient Outreach (Signed)
[name] Care Management  02/06/2016  Jenna [name] 1931-08-30 [id number]   Second outreach attempt to pt however unsuccessful. RN unable to leave a message with no available voice mail. RN will continue outreach attempt with another contact this week.  Raina Mina, RN Care Management Coordinator [place] Office [phone]

## 2016-02-07 ENCOUNTER — Encounter: Payer: Self-pay | Admitting: *Deleted

## 2016-02-07 ENCOUNTER — Other Ambulatory Visit: Payer: Self-pay | Admitting: *Deleted

## 2016-02-07 DIAGNOSIS — D692 Other nonthrombocytopenic purpura: Secondary | ICD-10-CM | POA: Diagnosis not present

## 2016-02-07 DIAGNOSIS — E871 Hypo-osmolality and hyponatremia: Secondary | ICD-10-CM | POA: Diagnosis not present

## 2016-02-07 DIAGNOSIS — D638 Anemia in other chronic diseases classified elsewhere: Secondary | ICD-10-CM | POA: Diagnosis not present

## 2016-02-07 DIAGNOSIS — K6389 Other specified diseases of intestine: Secondary | ICD-10-CM | POA: Diagnosis not present

## 2016-02-07 DIAGNOSIS — K7460 Unspecified cirrhosis of liver: Secondary | ICD-10-CM | POA: Diagnosis not present

## 2016-02-07 DIAGNOSIS — R188 Other ascites: Secondary | ICD-10-CM | POA: Diagnosis not present

## 2016-02-07 DIAGNOSIS — R946 Abnormal results of thyroid function studies: Secondary | ICD-10-CM | POA: Diagnosis not present

## 2016-02-07 NOTE — Patient Outreach (Signed)
[place]) Care Management  02/07/2016  DASHEA SERGENT April 22, 1931 [id number]  Third attempt to reach pt for transition of care however unsuccessful. RN able to leave a HIPAA approved voice message requesting a call back. Will inquire further if call is returned. Will send outreach letter and allow pt to call back. If no response will close this case.  Raina Mina, RN Care Management Coordinator [place] Office [phone]

## 2016-02-12 ENCOUNTER — Other Ambulatory Visit: Payer: Self-pay | Admitting: Oncology

## 2016-02-13 DIAGNOSIS — K7460 Unspecified cirrhosis of liver: Secondary | ICD-10-CM | POA: Diagnosis not present

## 2016-02-13 DIAGNOSIS — E039 Hypothyroidism, unspecified: Secondary | ICD-10-CM | POA: Diagnosis not present

## 2016-02-13 DIAGNOSIS — K5900 Constipation, unspecified: Secondary | ICD-10-CM | POA: Diagnosis not present

## 2016-02-13 DIAGNOSIS — R188 Other ascites: Secondary | ICD-10-CM | POA: Diagnosis not present

## 2016-02-13 DIAGNOSIS — R32 Unspecified urinary incontinence: Secondary | ICD-10-CM | POA: Diagnosis not present

## 2016-02-13 DIAGNOSIS — K429 Umbilical hernia without obstruction or gangrene: Secondary | ICD-10-CM | POA: Diagnosis not present

## 2016-02-23 ENCOUNTER — Other Ambulatory Visit: Payer: Self-pay | Admitting: *Deleted

## 2016-02-23 ENCOUNTER — Encounter: Payer: Self-pay | Admitting: *Deleted

## 2016-02-23 NOTE — Patient Outreach (Signed)
[place] [facility]) Care Management  02/23/2016  ANDRELLA KINSMAN August 20, 1931 [id number]  RN completed several outreach calls and sent an outreach letter to this pt however no response., Will close this case at this time via [facility] services and alert her provider if available.  Raina Mina, RN Care Management Coordinator [place] Office [phone]

## 2016-02-28 ENCOUNTER — Telehealth: Payer: Self-pay | Admitting: Gastroenterology

## 2016-02-28 NOTE — Telephone Encounter (Signed)
Received referral to schedule an appointment for follow up on diagnosis of cirrhosis with ascites. Patient is wanting to transfer to our office and see someone who has a "focus on treatment of liver disease". Records received indicate that patient has seen [facility] and [facility]. Referral placed on Dr. Lynne Leader desk for review.

## 2016-02-29 NOTE — Telephone Encounter (Signed)
Dr. Fuller Plan reviewed records and has declined to accept patient. Dr. Fuller Plan states that patient needs to be referred to [facility] by her PCP. I informed patient of this and she states that she will call her PCP

## 2016-03-11 DIAGNOSIS — K7290 Hepatic failure, unspecified without coma: Secondary | ICD-10-CM | POA: Diagnosis not present

## 2016-03-11 DIAGNOSIS — K7469 Other cirrhosis of liver: Secondary | ICD-10-CM | POA: Diagnosis not present

## 2016-03-11 DIAGNOSIS — R188 Other ascites: Secondary | ICD-10-CM | POA: Diagnosis not present

## 2016-03-11 DIAGNOSIS — K7460 Unspecified cirrhosis of liver: Secondary | ICD-10-CM | POA: Diagnosis not present

## 2016-03-12 ENCOUNTER — Other Ambulatory Visit: Payer: Self-pay | Admitting: Pharmacist

## 2016-03-12 ENCOUNTER — Other Ambulatory Visit (HOSPITAL_COMMUNITY): Payer: Self-pay | Admitting: Nurse Practitioner

## 2016-03-12 DIAGNOSIS — R978 Other abnormal tumor markers: Secondary | ICD-10-CM

## 2016-03-12 NOTE — Patient Outreach (Signed)
[place] [facility]) Care Management  03/12/2016  BRECCA TRIVETTE [birth year] [id number]  Patient was referred to [place] by [place] for medication assistance evaluation for Xifaxan.   Successful phone outreach to patient, HIPAA details verified and purpose of call explained to patient.    Discussed patient assistance application process for [employer] Patient Assistance Program.   Patient was counseled program may evaluate Part D beneficiaries on a case by case basis.    She is aware she will need to get her prescriber to complete the prescriber portion of the application.  Patient wishes to have [employer] Patient Assistance [employer] application mailed to her---she verified address listed in this chart for her is correct.   Plan:  Will send patient [employer] patient assistance program application via mail.   Will place follow-up outreach call to patient in the next 2 weeks.   Karrie Meres, PharmD, [place] [phone]

## 2016-03-13 ENCOUNTER — Encounter: Payer: Self-pay | Admitting: Pharmacist

## 2016-03-25 ENCOUNTER — Telehealth: Payer: Self-pay | Admitting: *Deleted

## 2016-03-25 ENCOUNTER — Other Ambulatory Visit: Payer: Self-pay | Admitting: Pharmacist

## 2016-03-25 NOTE — Patient Outreach (Signed)
[facility]) Care Management  03/25/2016  WILLISHA MORVAY 06-01-1931 [id number]  Unsuccessful phone outreach to patient to follow-up if patient received patient assistance application for Xifaxan.   No answer, HIPAA compliant voicemail left requesting return call from patient   Plan:  If no return call, will make second outreach attempt in the next week.    Karrie Meres, PharmD, [place] [phone]

## 2016-03-26 ENCOUNTER — Ambulatory Visit (HOSPITAL_COMMUNITY): Payer: PPO

## 2016-03-28 ENCOUNTER — Other Ambulatory Visit: Payer: Self-pay | Admitting: Pharmacist

## 2016-03-28 NOTE — Patient Outreach (Signed)
[place] [facility]) Care Management  03/28/2016  Jenna [name] 1931/05/01 [id number]  Second attempt to reach patient to follow up on patient assistance unsuccessful.  Phone continued to ring with no answer and no option to leave voicemail.    Plan:  Will make a third outreach attempt to patient next week.   Karrie Meres, PharmD, [place] [phone]

## 2016-04-01 ENCOUNTER — Other Ambulatory Visit: Payer: Self-pay | Admitting: Pharmacist

## 2016-04-01 NOTE — Patient Outreach (Signed)
[place] [facility]) Care Management  04/01/2016  Jenna [name] 1932-02-09 [id number]  Third attempt to reach patient to follow-up patient assistance application.  Call was answered, identified self and asked if [name] was available.  Person who answered phone, responded with "This is [name] but she is not available now, please call back another time."    Plan:  Will make another outreach attempt.  [place] referral was for patient assistance.    Karrie Meres, PharmD, [place] [phone]

## 2016-04-03 ENCOUNTER — Other Ambulatory Visit: Payer: Self-pay | Admitting: Pharmacist

## 2016-04-03 NOTE — Patient Outreach (Signed)
[place] [facility]) Care Management  04/03/2016  Jenna [name] 05-23-1931 [id number]  Unsuccessful phone outreach attempt to patient.  HIPAA compliant voicemail left requesting return call.   Four attempts to reach patient via phone have been made.  Patient answered phone earlier this week and stated she wasn't available at time of call.   Plan:  If no return call from patient, will consider if case closure is appropriate given referral was for patient assistance evaluation and patient has not actively engaged with [facility] Pharmacist.   Karrie Meres, PharmD, [place] [phone]

## 2016-04-05 ENCOUNTER — Other Ambulatory Visit: Payer: Self-pay | Admitting: Pharmacist

## 2016-04-05 NOTE — Patient Outreach (Signed)
[place] [facility]) Care Management  04/05/2016  Jenna [name] 10/10/1931 [id number]  Multiple phone attempts have been made to reach patient.  On 04/01/16 phone outreach, patient identified herself on phone and said "she is not available now."  Patient has not returned voice messages left for her.    Discussed case with [facility] Surveyor, quantity of Pharmacy.    Plan:  Will close this medication assistance case due to inability to maintain contact with patient.   Karrie Meres, PharmD, [place] [phone]

## 2016-04-11 ENCOUNTER — Ambulatory Visit (HOSPITAL_COMMUNITY): Payer: PPO

## 2016-04-23 ENCOUNTER — Ambulatory Visit (HOSPITAL_COMMUNITY)
Admission: RE | Admit: 2016-04-23 | Discharge: 2016-04-23 | Disposition: A | Discharge status: Home / Self Care | Payer: PPO | Source: Ambulatory Visit | Attending: Nurse Practitioner | Admitting: Nurse Practitioner

## 2016-04-23 DIAGNOSIS — R161 Splenomegaly, not elsewhere classified: Secondary | ICD-10-CM | POA: Diagnosis not present

## 2016-04-23 DIAGNOSIS — K838 Other specified diseases of biliary tract: Secondary | ICD-10-CM | POA: Diagnosis not present

## 2016-04-23 DIAGNOSIS — R188 Other ascites: Secondary | ICD-10-CM | POA: Diagnosis not present

## 2016-04-23 DIAGNOSIS — R978 Other abnormal tumor markers: Secondary | ICD-10-CM | POA: Insufficient documentation

## 2016-04-23 LAB — GLUCOSE, CAPILLARY: Glucose-Capillary: 102 mg/dL — ABNORMAL HIGH (ref 65–99)

## 2016-04-23 MED ORDER — FLUDEOXYGLUCOSE F - 18 (FDG) INJECTION
6.6000 | Freq: Once | INTRAVENOUS | Status: AC | PRN
  Administered 2016-04-23: 6.6 via INTRAVENOUS

## 2016-05-01 DIAGNOSIS — R188 Other ascites: Secondary | ICD-10-CM | POA: Diagnosis not present

## 2016-05-01 DIAGNOSIS — K7460 Unspecified cirrhosis of liver: Secondary | ICD-10-CM | POA: Diagnosis not present

## 2016-05-01 DIAGNOSIS — K7290 Hepatic failure, unspecified without coma: Secondary | ICD-10-CM | POA: Diagnosis not present

## 2016-05-01 DIAGNOSIS — R978 Other abnormal tumor markers: Secondary | ICD-10-CM | POA: Diagnosis not present

## 2016-05-13 DIAGNOSIS — Z0000 Encounter for general adult medical examination without abnormal findings: Secondary | ICD-10-CM | POA: Diagnosis not present

## 2016-05-13 DIAGNOSIS — F324 Major depressive disorder, single episode, in partial remission: Secondary | ICD-10-CM | POA: Diagnosis not present

## 2016-05-13 DIAGNOSIS — R32 Unspecified urinary incontinence: Secondary | ICD-10-CM | POA: Diagnosis not present

## 2016-05-13 DIAGNOSIS — M810 Age-related osteoporosis without current pathological fracture: Secondary | ICD-10-CM | POA: Diagnosis not present

## 2016-05-13 DIAGNOSIS — I10 Essential (primary) hypertension: Secondary | ICD-10-CM | POA: Diagnosis not present

## 2016-05-13 DIAGNOSIS — R188 Other ascites: Secondary | ICD-10-CM | POA: Diagnosis not present

## 2016-05-13 DIAGNOSIS — F419 Anxiety disorder, unspecified: Secondary | ICD-10-CM | POA: Diagnosis not present

## 2016-05-13 DIAGNOSIS — E7800 Pure hypercholesterolemia, unspecified: Secondary | ICD-10-CM | POA: Diagnosis not present

## 2016-05-13 DIAGNOSIS — E039 Hypothyroidism, unspecified: Secondary | ICD-10-CM | POA: Diagnosis not present

## 2016-05-13 DIAGNOSIS — K7460 Unspecified cirrhosis of liver: Secondary | ICD-10-CM | POA: Diagnosis not present

## 2016-05-14 NOTE — Progress Notes (Signed)
This encounter was created in error - please disregard.

## 2016-05-29 DIAGNOSIS — E860 Dehydration: Secondary | ICD-10-CM | POA: Diagnosis not present

## 2016-07-17 DIAGNOSIS — E039 Hypothyroidism, unspecified: Secondary | ICD-10-CM | POA: Diagnosis not present

## 2016-07-30 DIAGNOSIS — K7469 Other cirrhosis of liver: Secondary | ICD-10-CM | POA: Diagnosis not present

## 2016-08-05 DIAGNOSIS — K7290 Hepatic failure, unspecified without coma: Secondary | ICD-10-CM | POA: Diagnosis not present

## 2016-08-05 DIAGNOSIS — I8500 Esophageal varices without bleeding: Secondary | ICD-10-CM | POA: Diagnosis not present

## 2016-08-05 DIAGNOSIS — R188 Other ascites: Secondary | ICD-10-CM | POA: Diagnosis not present

## 2016-08-05 DIAGNOSIS — K7460 Unspecified cirrhosis of liver: Secondary | ICD-10-CM | POA: Diagnosis not present

## 2016-09-24 DIAGNOSIS — L299 Pruritus, unspecified: Secondary | ICD-10-CM | POA: Diagnosis not present

## 2016-09-24 DIAGNOSIS — E039 Hypothyroidism, unspecified: Secondary | ICD-10-CM | POA: Diagnosis not present

## 2016-10-08 DIAGNOSIS — L308 Other specified dermatitis: Secondary | ICD-10-CM | POA: Diagnosis not present

## 2016-10-08 DIAGNOSIS — L309 Dermatitis, unspecified: Secondary | ICD-10-CM | POA: Diagnosis not present

## 2016-10-11 DIAGNOSIS — H2513 Age-related nuclear cataract, bilateral: Secondary | ICD-10-CM | POA: Diagnosis not present

## 2016-10-22 DIAGNOSIS — Z4802 Encounter for removal of sutures: Secondary | ICD-10-CM | POA: Diagnosis not present

## 2016-10-22 DIAGNOSIS — L309 Dermatitis, unspecified: Secondary | ICD-10-CM | POA: Diagnosis not present

## 2016-11-12 DIAGNOSIS — J301 Allergic rhinitis due to pollen: Secondary | ICD-10-CM | POA: Diagnosis not present

## 2016-11-12 DIAGNOSIS — L501 Idiopathic urticaria: Secondary | ICD-10-CM | POA: Diagnosis not present

## 2016-12-09 DIAGNOSIS — Z23 Encounter for immunization: Secondary | ICD-10-CM | POA: Diagnosis not present

## 2016-12-09 DIAGNOSIS — R32 Unspecified urinary incontinence: Secondary | ICD-10-CM | POA: Diagnosis not present

## 2016-12-09 DIAGNOSIS — F324 Major depressive disorder, single episode, in partial remission: Secondary | ICD-10-CM | POA: Diagnosis not present

## 2016-12-09 DIAGNOSIS — K7460 Unspecified cirrhosis of liver: Secondary | ICD-10-CM | POA: Diagnosis not present

## 2016-12-09 DIAGNOSIS — E039 Hypothyroidism, unspecified: Secondary | ICD-10-CM | POA: Diagnosis not present

## 2016-12-09 DIAGNOSIS — R188 Other ascites: Secondary | ICD-10-CM | POA: Diagnosis not present

## 2016-12-09 DIAGNOSIS — E7800 Pure hypercholesterolemia, unspecified: Secondary | ICD-10-CM | POA: Diagnosis not present

## 2016-12-09 DIAGNOSIS — M810 Age-related osteoporosis without current pathological fracture: Secondary | ICD-10-CM | POA: Diagnosis not present

## 2016-12-09 DIAGNOSIS — Z6822 Body mass index (BMI) 22.0-22.9, adult: Secondary | ICD-10-CM | POA: Diagnosis not present

## 2016-12-09 DIAGNOSIS — I10 Essential (primary) hypertension: Secondary | ICD-10-CM | POA: Diagnosis not present

## 2016-12-09 DIAGNOSIS — Z853 Personal history of malignant neoplasm of breast: Secondary | ICD-10-CM | POA: Diagnosis not present

## 2016-12-09 DIAGNOSIS — F419 Anxiety disorder, unspecified: Secondary | ICD-10-CM | POA: Diagnosis not present

## 2016-12-27 ENCOUNTER — Encounter: Payer: PPO | Admitting: Adult Health

## 2016-12-31 DIAGNOSIS — K625 Hemorrhage of anus and rectum: Secondary | ICD-10-CM | POA: Diagnosis not present

## 2016-12-31 DIAGNOSIS — N183 Chronic kidney disease, stage 3 (moderate): Secondary | ICD-10-CM | POA: Diagnosis not present

## 2016-12-31 DIAGNOSIS — K648 Other hemorrhoids: Secondary | ICD-10-CM | POA: Diagnosis not present

## 2017-01-03 DIAGNOSIS — E860 Dehydration: Secondary | ICD-10-CM | POA: Diagnosis not present

## 2017-02-03 DIAGNOSIS — I8500 Esophageal varices without bleeding: Secondary | ICD-10-CM | POA: Diagnosis not present

## 2017-02-03 DIAGNOSIS — R188 Other ascites: Secondary | ICD-10-CM | POA: Diagnosis not present

## 2017-02-03 DIAGNOSIS — K7469 Other cirrhosis of liver: Secondary | ICD-10-CM | POA: Diagnosis not present

## 2017-02-03 DIAGNOSIS — K7290 Hepatic failure, unspecified without coma: Secondary | ICD-10-CM | POA: Diagnosis not present

## 2017-03-06 IMAGING — CR DG FOOT COMPLETE 3+V*L*
3 series · 3 of 3 positions shown · non-contrast
Comparison: None.

CLINICAL DATA: Twisted foot yesterday.  Initial evaluation.

EXAM:
LEFT FOOT - COMPLETE 3+ VIEW

[t foot ap left]
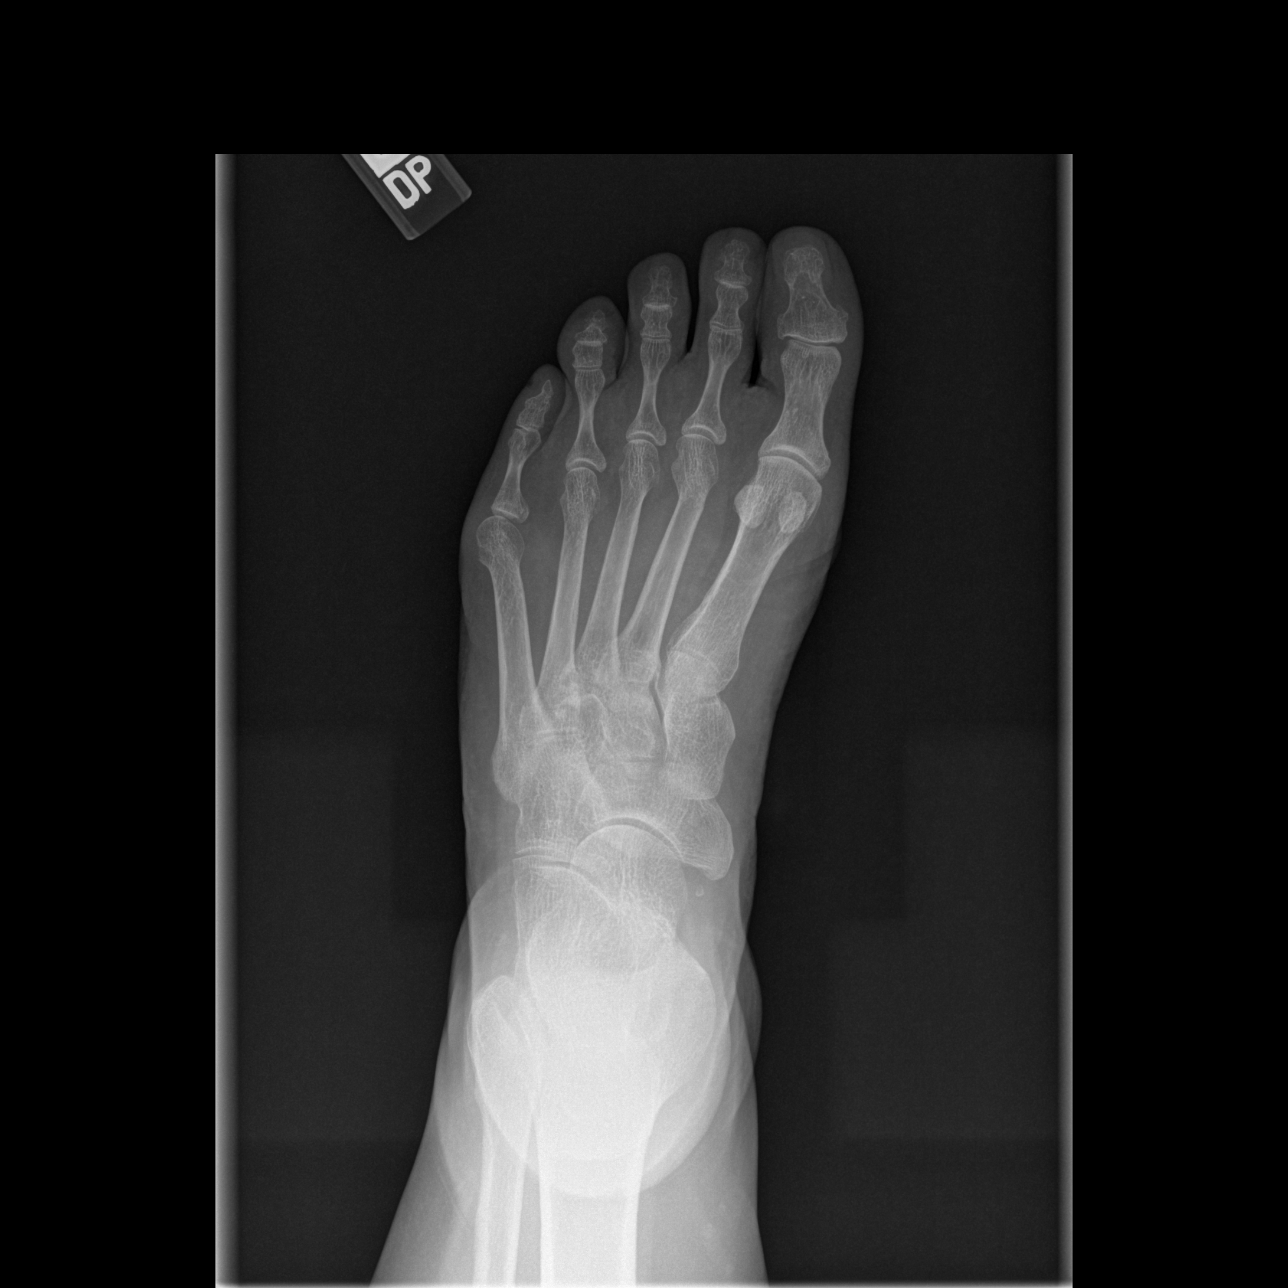

[t foot oblique left]
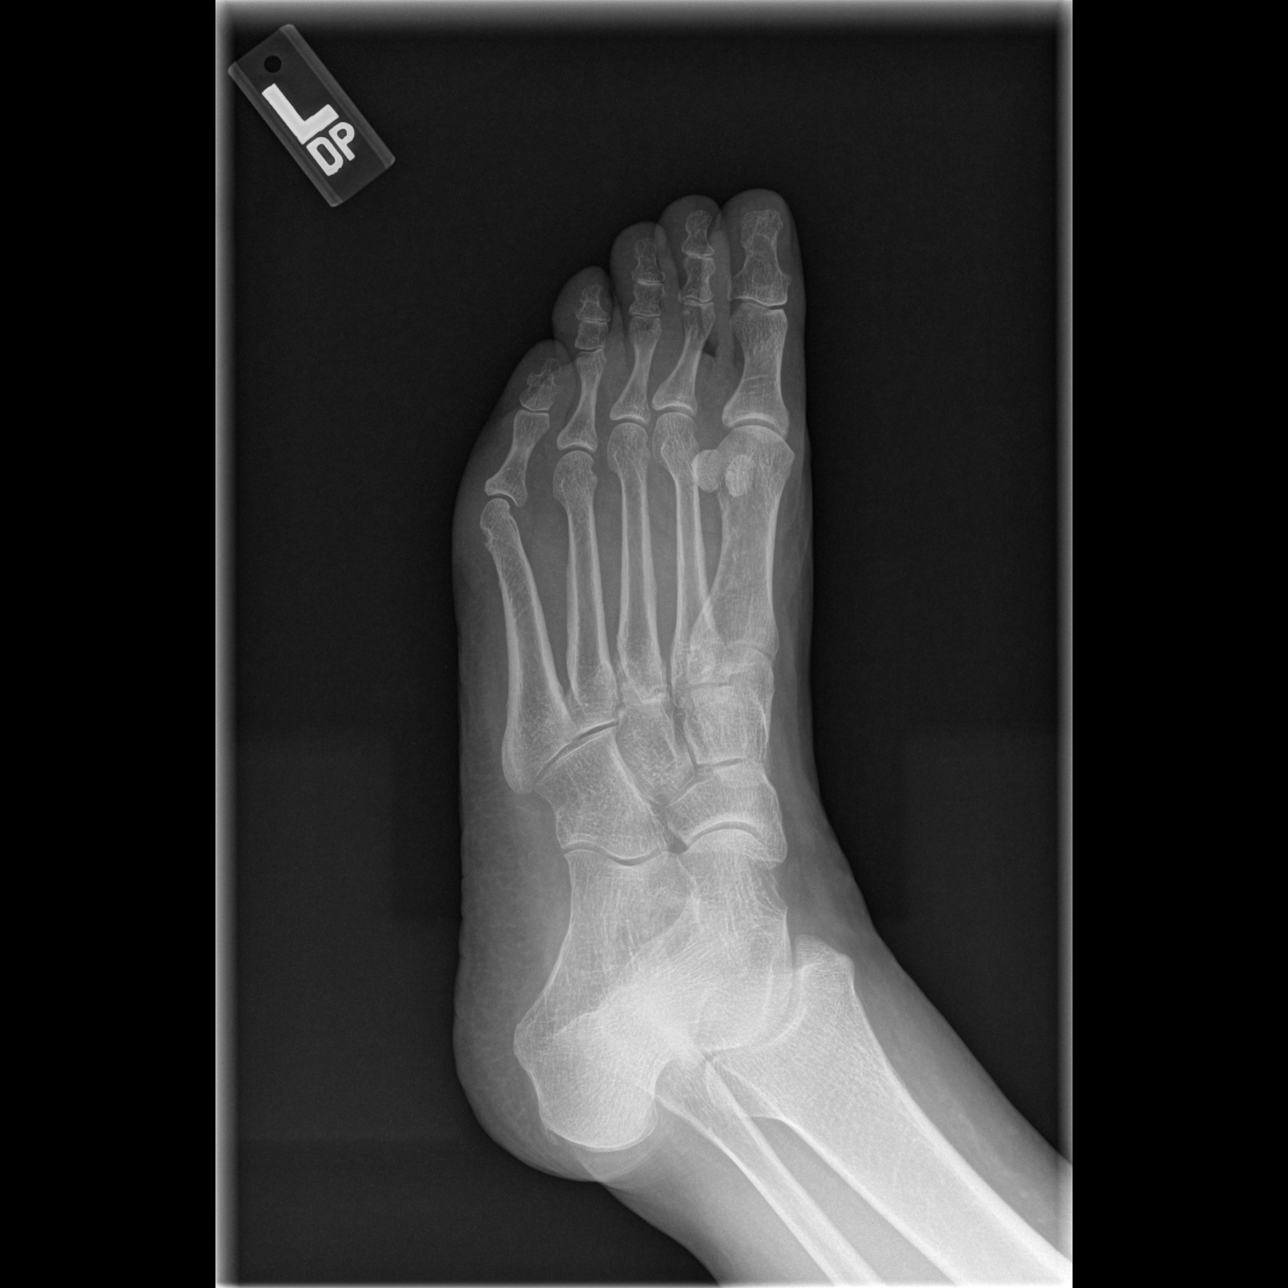

[t foot lat left]
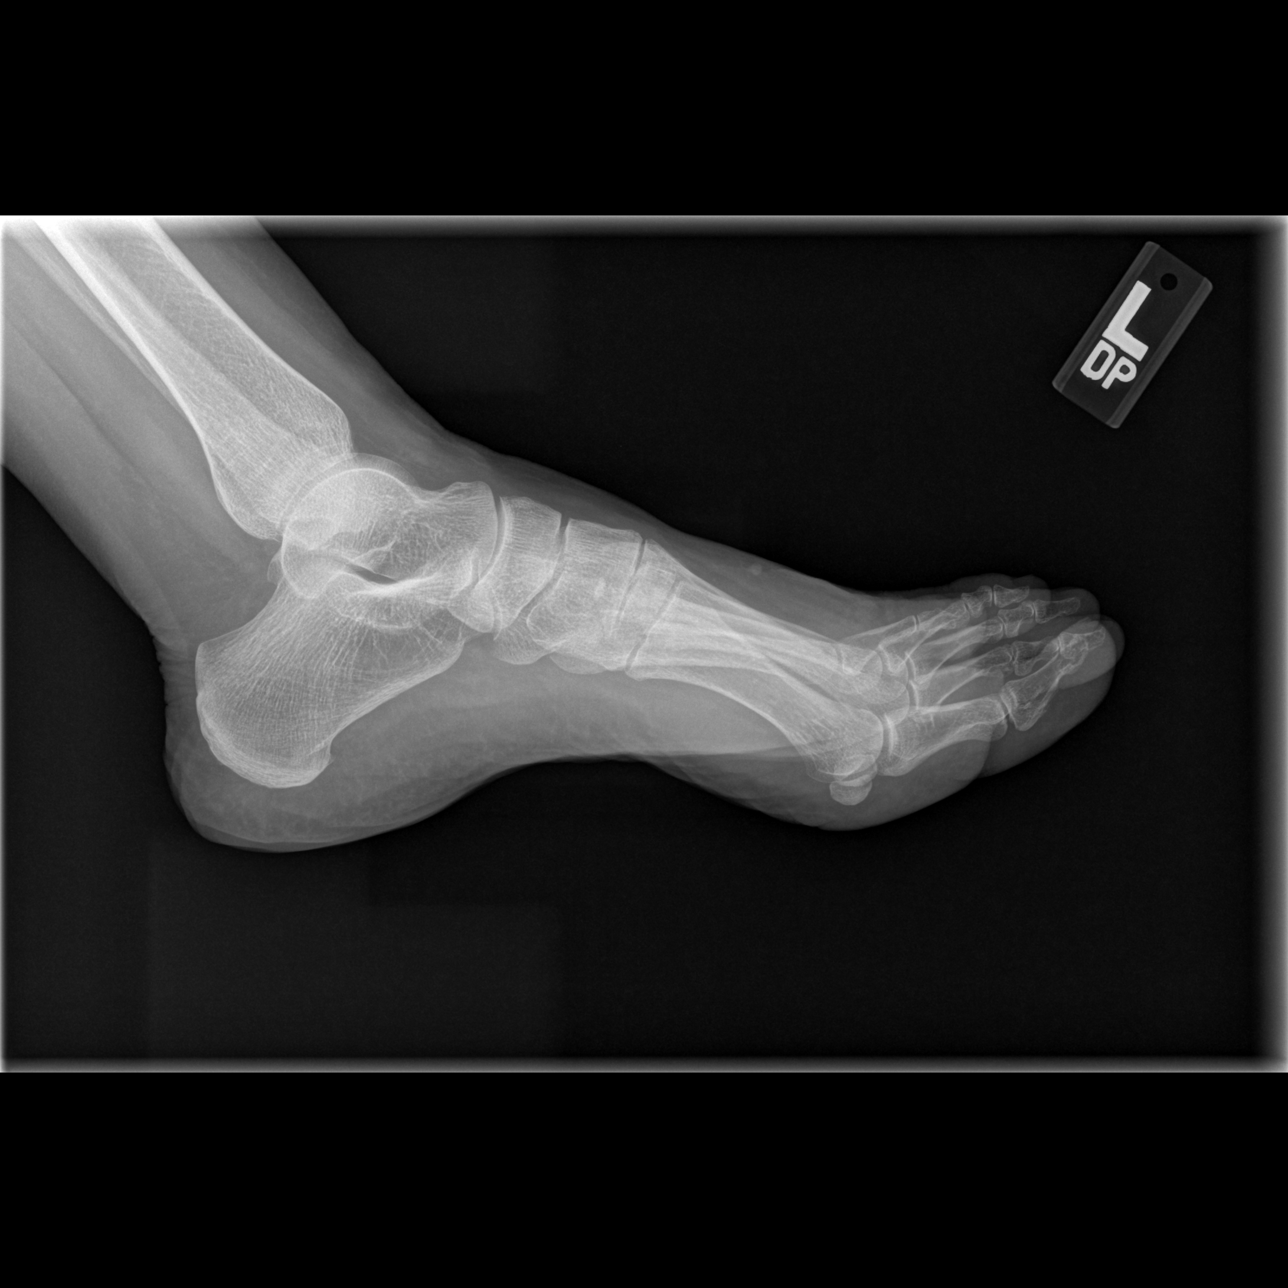

[3 of 3 positions shown; findings below may reference images not displayed]

FINDINGS: No acute bony or joint abnormality identified .No evidence of
fracture or dislocation.
IMPRESSION: Negative exam.

## 2017-04-16 DIAGNOSIS — Z853 Personal history of malignant neoplasm of breast: Secondary | ICD-10-CM | POA: Diagnosis not present

## 2017-04-16 DIAGNOSIS — R928 Other abnormal and inconclusive findings on diagnostic imaging of breast: Secondary | ICD-10-CM | POA: Diagnosis not present

## 2017-05-19 DIAGNOSIS — E7800 Pure hypercholesterolemia, unspecified: Secondary | ICD-10-CM | POA: Diagnosis not present

## 2017-05-19 DIAGNOSIS — M810 Age-related osteoporosis without current pathological fracture: Secondary | ICD-10-CM | POA: Diagnosis not present

## 2017-05-19 DIAGNOSIS — E039 Hypothyroidism, unspecified: Secondary | ICD-10-CM | POA: Diagnosis not present

## 2017-05-19 DIAGNOSIS — K7460 Unspecified cirrhosis of liver: Secondary | ICD-10-CM | POA: Diagnosis not present

## 2017-05-19 DIAGNOSIS — I10 Essential (primary) hypertension: Secondary | ICD-10-CM | POA: Diagnosis not present

## 2017-05-19 DIAGNOSIS — R32 Unspecified urinary incontinence: Secondary | ICD-10-CM | POA: Diagnosis not present

## 2017-05-19 DIAGNOSIS — D692 Other nonthrombocytopenic purpura: Secondary | ICD-10-CM | POA: Diagnosis not present

## 2017-05-19 DIAGNOSIS — R188 Other ascites: Secondary | ICD-10-CM | POA: Diagnosis not present

## 2017-05-19 DIAGNOSIS — F419 Anxiety disorder, unspecified: Secondary | ICD-10-CM | POA: Diagnosis not present

## 2017-05-19 DIAGNOSIS — Z0000 Encounter for general adult medical examination without abnormal findings: Secondary | ICD-10-CM | POA: Diagnosis not present

## 2017-05-19 DIAGNOSIS — F324 Major depressive disorder, single episode, in partial remission: Secondary | ICD-10-CM | POA: Diagnosis not present

## 2017-05-19 DIAGNOSIS — Z6824 Body mass index (BMI) 24.0-24.9, adult: Secondary | ICD-10-CM | POA: Diagnosis not present

## 2017-07-02 DIAGNOSIS — D509 Iron deficiency anemia, unspecified: Secondary | ICD-10-CM | POA: Diagnosis not present

## 2017-07-04 DIAGNOSIS — H25012 Cortical age-related cataract, left eye: Secondary | ICD-10-CM | POA: Diagnosis not present

## 2017-07-04 DIAGNOSIS — H25041 Posterior subcapsular polar age-related cataract, right eye: Secondary | ICD-10-CM | POA: Diagnosis not present

## 2017-07-04 DIAGNOSIS — H524 Presbyopia: Secondary | ICD-10-CM | POA: Diagnosis not present

## 2017-07-04 DIAGNOSIS — H2513 Age-related nuclear cataract, bilateral: Secondary | ICD-10-CM | POA: Diagnosis not present

## 2017-07-10 DIAGNOSIS — M810 Age-related osteoporosis without current pathological fracture: Secondary | ICD-10-CM | POA: Diagnosis not present

## 2017-08-13 DIAGNOSIS — K7469 Other cirrhosis of liver: Secondary | ICD-10-CM | POA: Diagnosis not present

## 2017-08-13 DIAGNOSIS — I8500 Esophageal varices without bleeding: Secondary | ICD-10-CM | POA: Diagnosis not present

## 2017-08-13 DIAGNOSIS — R188 Other ascites: Secondary | ICD-10-CM | POA: Diagnosis not present

## 2017-08-27 DIAGNOSIS — M810 Age-related osteoporosis without current pathological fracture: Secondary | ICD-10-CM | POA: Diagnosis not present

## 2017-09-16 DIAGNOSIS — H25811 Combined forms of age-related cataract, right eye: Secondary | ICD-10-CM | POA: Diagnosis not present

## 2017-09-16 DIAGNOSIS — H2511 Age-related nuclear cataract, right eye: Secondary | ICD-10-CM | POA: Diagnosis not present

## 2017-09-16 DIAGNOSIS — H25041 Posterior subcapsular polar age-related cataract, right eye: Secondary | ICD-10-CM | POA: Diagnosis not present

## 2017-11-19 DIAGNOSIS — M7712 Lateral epicondylitis, left elbow: Secondary | ICD-10-CM | POA: Diagnosis not present

## 2017-11-25 DIAGNOSIS — H25012 Cortical age-related cataract, left eye: Secondary | ICD-10-CM | POA: Diagnosis not present

## 2017-11-25 DIAGNOSIS — H2512 Age-related nuclear cataract, left eye: Secondary | ICD-10-CM | POA: Diagnosis not present

## 2017-11-25 DIAGNOSIS — H25812 Combined forms of age-related cataract, left eye: Secondary | ICD-10-CM | POA: Diagnosis not present

## 2017-11-25 IMAGING — DX DG CHEST 1V PORT
1 series · 1 of 1 positions shown · non-contrast
Comparison: PA and lateral chest 11/09/2014 and 12/02/2014.

CLINICAL DATA: Hypotension hypothermia. History of breast cancer on
the right.

EXAM:
PORTABLE CHEST 1 VIEW

[chest ap]
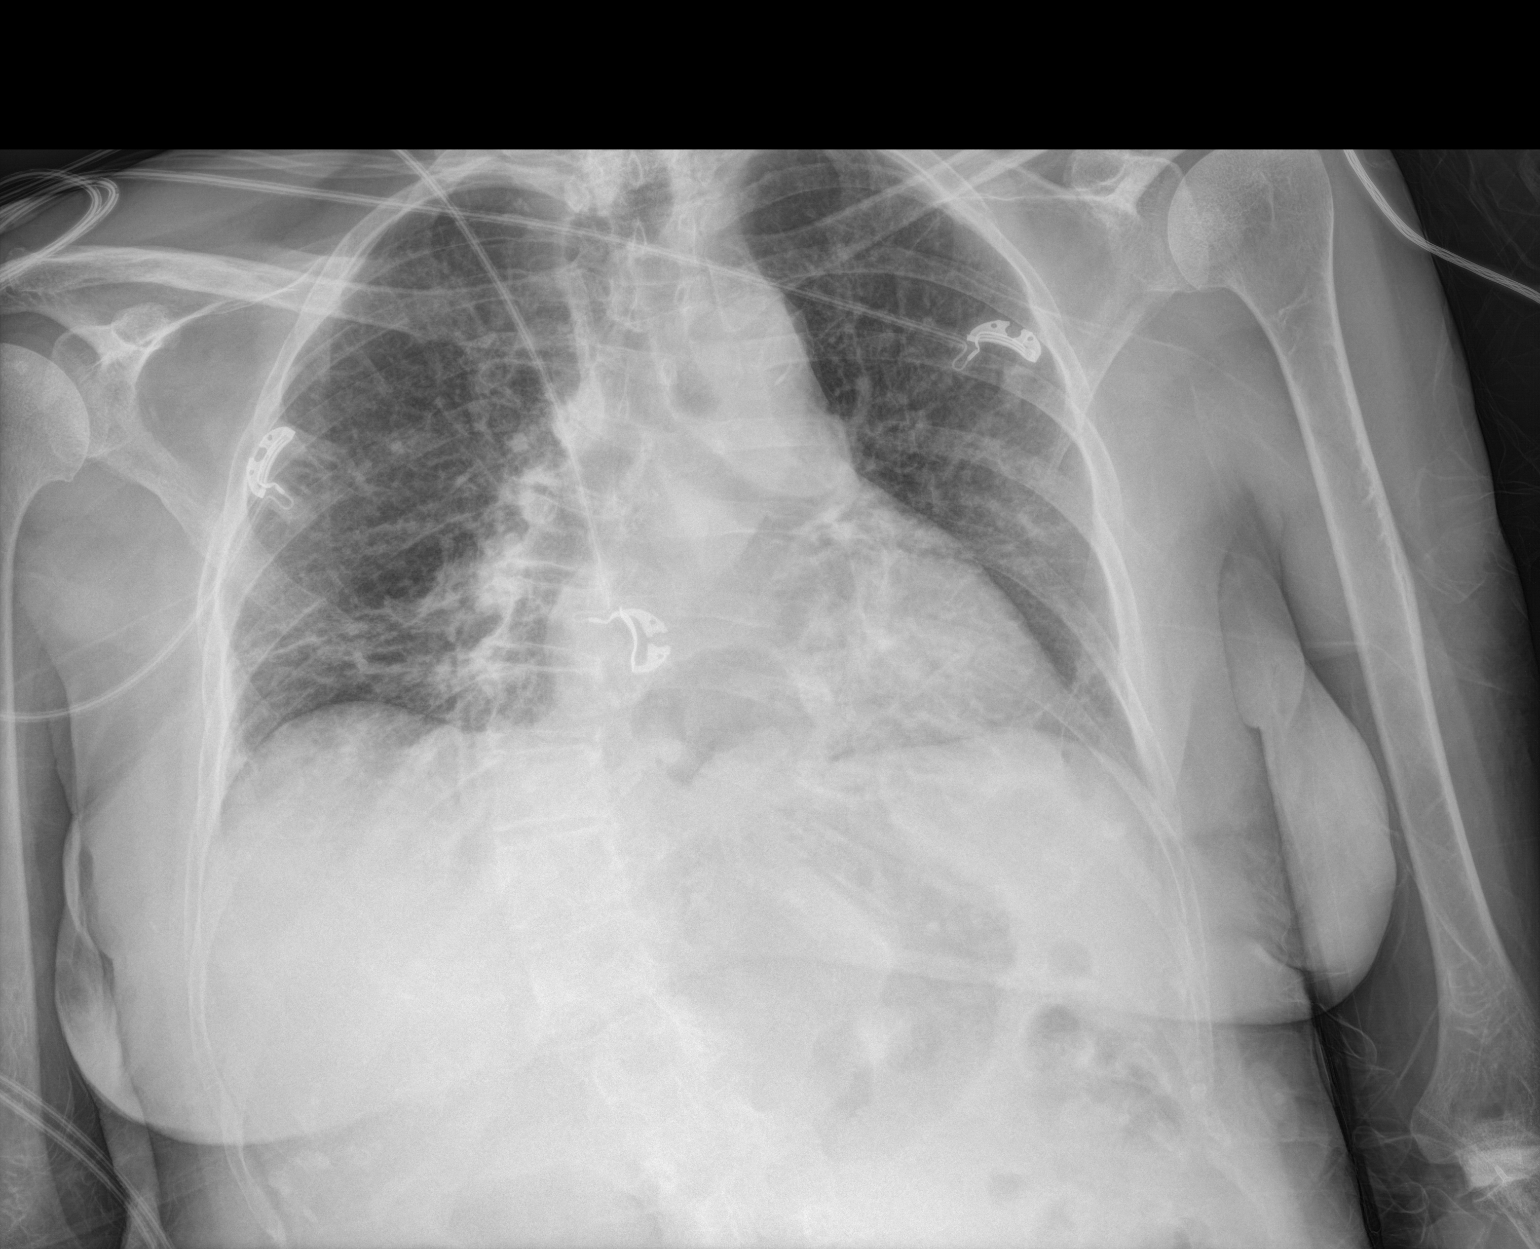

[1 of 1 positions shown; findings below may reference images not displayed]

FINDINGS: Streaky airspace disease is present in the right base appears and
mildly increased compared to the prior study. The left lung is
clear. Heart size is normal. No pneumothorax or pleural effusion.
Marked thoracolumbar scoliosis is again seen.
IMPRESSION: Some increase in streaky right basilar airspace opacities since the
most recent examination could be due to atelectasis or infection.

## 2017-12-09 DIAGNOSIS — Z23 Encounter for immunization: Secondary | ICD-10-CM | POA: Diagnosis not present

## 2017-12-09 DIAGNOSIS — D692 Other nonthrombocytopenic purpura: Secondary | ICD-10-CM | POA: Diagnosis not present

## 2017-12-09 DIAGNOSIS — E039 Hypothyroidism, unspecified: Secondary | ICD-10-CM | POA: Diagnosis not present

## 2017-12-09 DIAGNOSIS — E7800 Pure hypercholesterolemia, unspecified: Secondary | ICD-10-CM | POA: Diagnosis not present

## 2017-12-09 DIAGNOSIS — F419 Anxiety disorder, unspecified: Secondary | ICD-10-CM | POA: Diagnosis not present

## 2017-12-09 DIAGNOSIS — H60543 Acute eczematoid otitis externa, bilateral: Secondary | ICD-10-CM | POA: Diagnosis not present

## 2017-12-09 DIAGNOSIS — F330 Major depressive disorder, recurrent, mild: Secondary | ICD-10-CM | POA: Diagnosis not present

## 2017-12-09 DIAGNOSIS — R188 Other ascites: Secondary | ICD-10-CM | POA: Diagnosis not present

## 2017-12-09 DIAGNOSIS — K7460 Unspecified cirrhosis of liver: Secondary | ICD-10-CM | POA: Diagnosis not present

## 2017-12-09 DIAGNOSIS — I10 Essential (primary) hypertension: Secondary | ICD-10-CM | POA: Diagnosis not present

## 2017-12-09 DIAGNOSIS — R32 Unspecified urinary incontinence: Secondary | ICD-10-CM | POA: Diagnosis not present

## 2017-12-09 DIAGNOSIS — M810 Age-related osteoporosis without current pathological fracture: Secondary | ICD-10-CM | POA: Diagnosis not present

## 2018-01-04 DIAGNOSIS — M25511 Pain in right shoulder: Secondary | ICD-10-CM | POA: Diagnosis not present

## 2018-01-04 IMAGING — CR DG CHEST 2V
2 series · 2 of 2 positions shown · non-contrast
Comparison: 02/04/2015

CLINICAL DATA: Recent pneumonia with antibiotics, clinically
improved

EXAM:
CHEST  2 VIEW

[w chest pa]
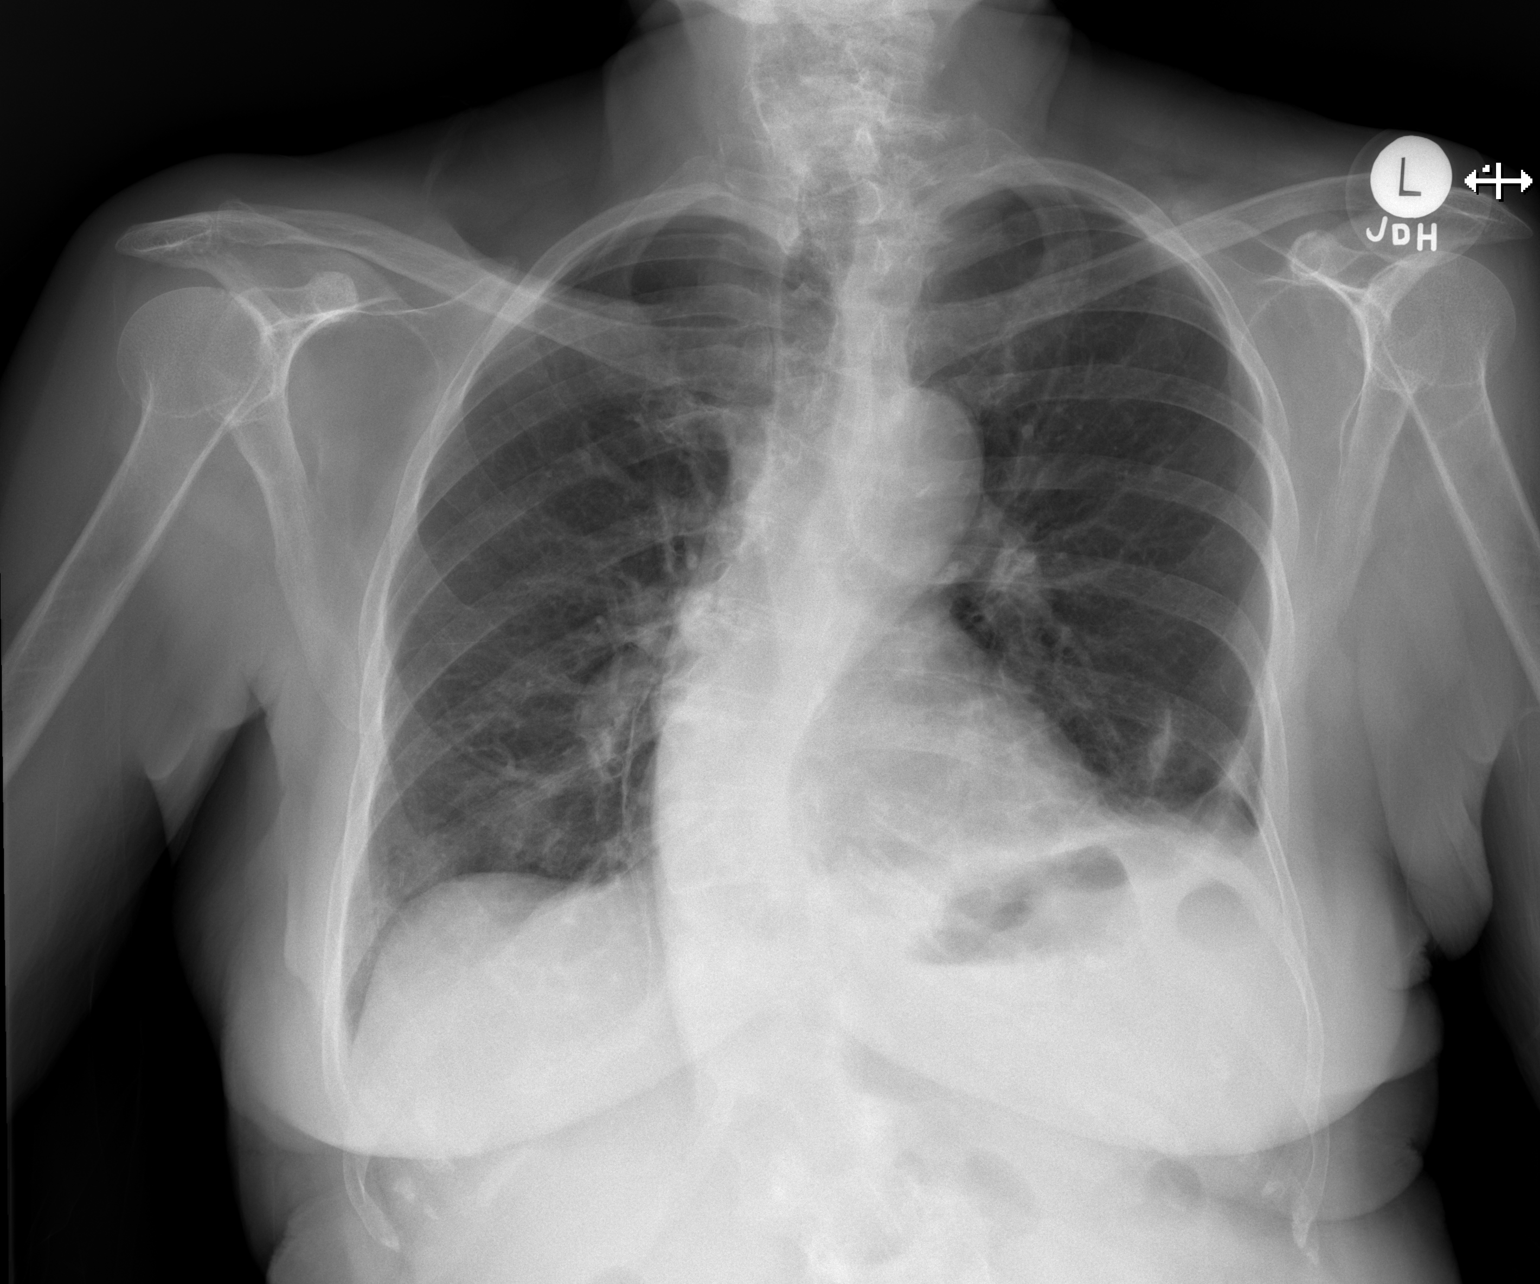

[w chest lat]
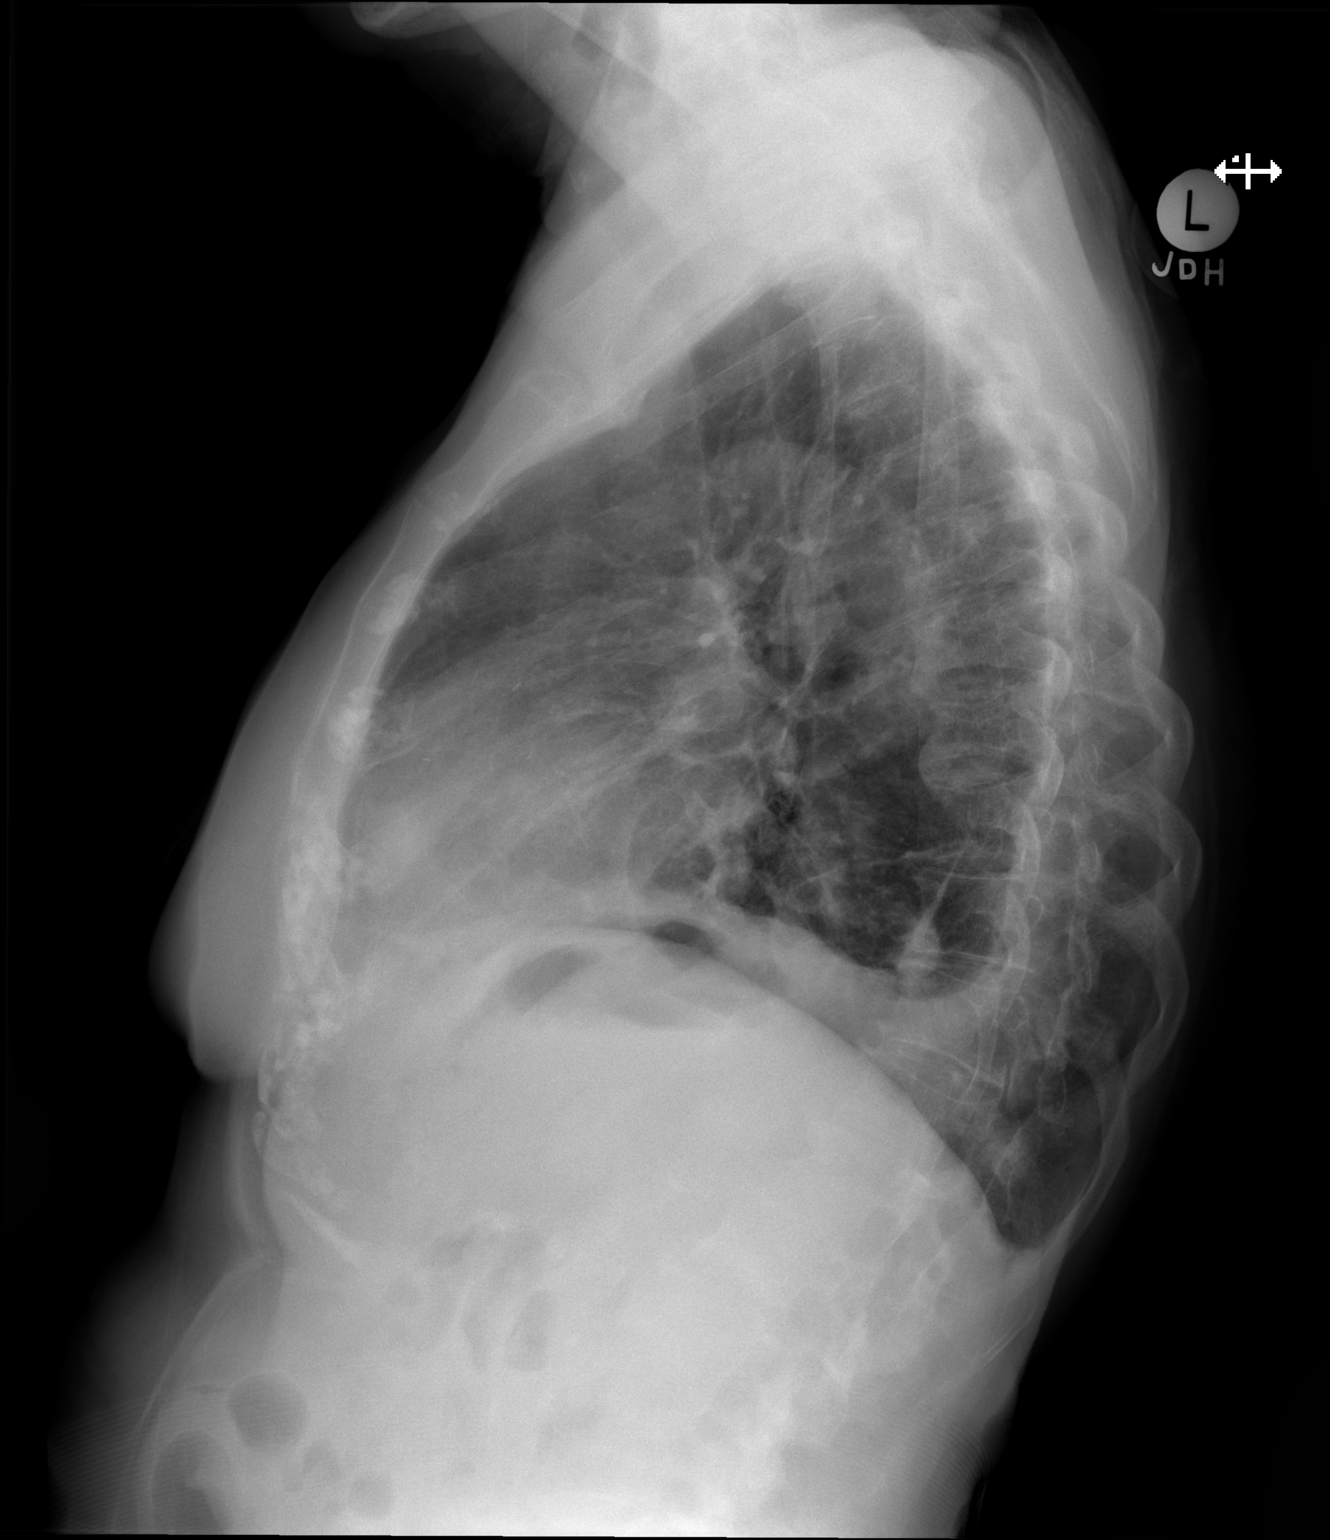

[2 of 2 positions shown; findings below may reference images not displayed]

FINDINGS: Heart size upper normal. Significant scoliosis. Vascular pattern
normal.

Essentially complete resolution of right-sided airspace disease with
minimal residual atelectasis right lung base. On the left, there is
also significant improvement with mild residual left lower lobe
infiltrate and small residual left effusion.
IMPRESSION: Significant bilateral improvement with mild persistent left lower
lobe infiltrate and small left effusion. Would suggest follow-up
radiograph to document complete resolution.

## 2018-01-06 DIAGNOSIS — H1013 Acute atopic conjunctivitis, bilateral: Secondary | ICD-10-CM | POA: Diagnosis not present

## 2018-02-11 DIAGNOSIS — K7469 Other cirrhosis of liver: Secondary | ICD-10-CM | POA: Diagnosis not present

## 2018-02-17 DIAGNOSIS — N183 Chronic kidney disease, stage 3 (moderate): Secondary | ICD-10-CM | POA: Diagnosis not present

## 2018-02-17 DIAGNOSIS — E7800 Pure hypercholesterolemia, unspecified: Secondary | ICD-10-CM | POA: Diagnosis not present

## 2018-02-27 DIAGNOSIS — M810 Age-related osteoporosis without current pathological fracture: Secondary | ICD-10-CM | POA: Diagnosis not present

## 2018-03-16 DIAGNOSIS — J0100 Acute maxillary sinusitis, unspecified: Secondary | ICD-10-CM | POA: Diagnosis not present

## 2018-03-16 DIAGNOSIS — J069 Acute upper respiratory infection, unspecified: Secondary | ICD-10-CM | POA: Diagnosis not present

## 2018-04-19 DIAGNOSIS — J309 Allergic rhinitis, unspecified: Secondary | ICD-10-CM | POA: Diagnosis not present

## 2018-04-19 DIAGNOSIS — H60549 Acute eczematoid otitis externa, unspecified ear: Secondary | ICD-10-CM | POA: Diagnosis not present

## 2018-05-06 DIAGNOSIS — H1013 Acute atopic conjunctivitis, bilateral: Secondary | ICD-10-CM | POA: Diagnosis not present

## 2018-05-27 DIAGNOSIS — J309 Allergic rhinitis, unspecified: Secondary | ICD-10-CM | POA: Diagnosis not present

## 2018-05-27 DIAGNOSIS — F419 Anxiety disorder, unspecified: Secondary | ICD-10-CM | POA: Diagnosis not present

## 2018-05-27 DIAGNOSIS — R32 Unspecified urinary incontinence: Secondary | ICD-10-CM | POA: Diagnosis not present

## 2018-05-27 DIAGNOSIS — R188 Other ascites: Secondary | ICD-10-CM | POA: Diagnosis not present

## 2018-05-27 DIAGNOSIS — D692 Other nonthrombocytopenic purpura: Secondary | ICD-10-CM | POA: Diagnosis not present

## 2018-05-27 DIAGNOSIS — Z0000 Encounter for general adult medical examination without abnormal findings: Secondary | ICD-10-CM | POA: Diagnosis not present

## 2018-05-27 DIAGNOSIS — K7460 Unspecified cirrhosis of liver: Secondary | ICD-10-CM | POA: Diagnosis not present

## 2018-05-27 DIAGNOSIS — D509 Iron deficiency anemia, unspecified: Secondary | ICD-10-CM | POA: Diagnosis not present

## 2018-05-27 DIAGNOSIS — I10 Essential (primary) hypertension: Secondary | ICD-10-CM | POA: Diagnosis not present

## 2018-05-27 DIAGNOSIS — E039 Hypothyroidism, unspecified: Secondary | ICD-10-CM | POA: Diagnosis not present

## 2018-05-27 DIAGNOSIS — E7800 Pure hypercholesterolemia, unspecified: Secondary | ICD-10-CM | POA: Diagnosis not present

## 2018-05-27 DIAGNOSIS — M810 Age-related osteoporosis without current pathological fracture: Secondary | ICD-10-CM | POA: Diagnosis not present

## 2018-09-02 DIAGNOSIS — E039 Hypothyroidism, unspecified: Secondary | ICD-10-CM | POA: Diagnosis not present

## 2018-09-02 DIAGNOSIS — I10 Essential (primary) hypertension: Secondary | ICD-10-CM | POA: Diagnosis not present

## 2018-09-02 DIAGNOSIS — I2510 Atherosclerotic heart disease of native coronary artery without angina pectoris: Secondary | ICD-10-CM | POA: Diagnosis not present

## 2018-09-02 DIAGNOSIS — N183 Chronic kidney disease, stage 3 (moderate): Secondary | ICD-10-CM | POA: Diagnosis not present

## 2018-09-02 DIAGNOSIS — F324 Major depressive disorder, single episode, in partial remission: Secondary | ICD-10-CM | POA: Diagnosis not present

## 2018-09-02 DIAGNOSIS — E7800 Pure hypercholesterolemia, unspecified: Secondary | ICD-10-CM | POA: Diagnosis not present

## 2018-09-02 DIAGNOSIS — M810 Age-related osteoporosis without current pathological fracture: Secondary | ICD-10-CM | POA: Diagnosis not present

## 2018-09-02 DIAGNOSIS — Z853 Personal history of malignant neoplasm of breast: Secondary | ICD-10-CM | POA: Diagnosis not present

## 2018-09-02 DIAGNOSIS — D509 Iron deficiency anemia, unspecified: Secondary | ICD-10-CM | POA: Diagnosis not present

## 2018-09-02 DIAGNOSIS — D638 Anemia in other chronic diseases classified elsewhere: Secondary | ICD-10-CM | POA: Diagnosis not present

## 2018-09-02 DIAGNOSIS — F330 Major depressive disorder, recurrent, mild: Secondary | ICD-10-CM | POA: Diagnosis not present

## 2018-09-08 DIAGNOSIS — K7469 Other cirrhosis of liver: Secondary | ICD-10-CM | POA: Diagnosis not present

## 2018-09-18 DIAGNOSIS — K625 Hemorrhage of anus and rectum: Secondary | ICD-10-CM | POA: Diagnosis not present

## 2018-09-18 DIAGNOSIS — K648 Other hemorrhoids: Secondary | ICD-10-CM | POA: Diagnosis not present

## 2018-10-07 DIAGNOSIS — D638 Anemia in other chronic diseases classified elsewhere: Secondary | ICD-10-CM | POA: Diagnosis not present

## 2018-10-07 DIAGNOSIS — I10 Essential (primary) hypertension: Secondary | ICD-10-CM | POA: Diagnosis not present

## 2018-10-07 DIAGNOSIS — I2510 Atherosclerotic heart disease of native coronary artery without angina pectoris: Secondary | ICD-10-CM | POA: Diagnosis not present

## 2018-10-07 DIAGNOSIS — M810 Age-related osteoporosis without current pathological fracture: Secondary | ICD-10-CM | POA: Diagnosis not present

## 2018-10-07 DIAGNOSIS — N183 Chronic kidney disease, stage 3 (moderate): Secondary | ICD-10-CM | POA: Diagnosis not present

## 2018-10-07 DIAGNOSIS — Z853 Personal history of malignant neoplasm of breast: Secondary | ICD-10-CM | POA: Diagnosis not present

## 2018-10-07 DIAGNOSIS — F324 Major depressive disorder, single episode, in partial remission: Secondary | ICD-10-CM | POA: Diagnosis not present

## 2018-10-07 DIAGNOSIS — E039 Hypothyroidism, unspecified: Secondary | ICD-10-CM | POA: Diagnosis not present

## 2018-10-07 DIAGNOSIS — F330 Major depressive disorder, recurrent, mild: Secondary | ICD-10-CM | POA: Diagnosis not present

## 2018-10-07 DIAGNOSIS — D509 Iron deficiency anemia, unspecified: Secondary | ICD-10-CM | POA: Diagnosis not present

## 2018-10-07 DIAGNOSIS — E7800 Pure hypercholesterolemia, unspecified: Secondary | ICD-10-CM | POA: Diagnosis not present

## 2018-10-14 DIAGNOSIS — Z86010 Personal history of colonic polyps: Secondary | ICD-10-CM | POA: Diagnosis not present

## 2018-10-14 DIAGNOSIS — K921 Melena: Secondary | ICD-10-CM | POA: Diagnosis not present

## 2018-10-22 DIAGNOSIS — K5520 Angiodysplasia of colon without hemorrhage: Secondary | ICD-10-CM | POA: Diagnosis not present

## 2018-10-22 DIAGNOSIS — R361 Hematospermia: Secondary | ICD-10-CM | POA: Diagnosis not present

## 2018-10-22 DIAGNOSIS — K648 Other hemorrhoids: Secondary | ICD-10-CM | POA: Diagnosis not present

## 2018-10-22 DIAGNOSIS — Z86010 Personal history of colonic polyps: Secondary | ICD-10-CM | POA: Diagnosis not present

## 2018-10-22 DIAGNOSIS — Z1211 Encounter for screening for malignant neoplasm of colon: Secondary | ICD-10-CM | POA: Diagnosis not present

## 2018-10-27 DIAGNOSIS — L218 Other seborrheic dermatitis: Secondary | ICD-10-CM | POA: Diagnosis not present

## 2018-11-26 DIAGNOSIS — I10 Essential (primary) hypertension: Secondary | ICD-10-CM | POA: Diagnosis not present

## 2018-11-26 DIAGNOSIS — I2510 Atherosclerotic heart disease of native coronary artery without angina pectoris: Secondary | ICD-10-CM | POA: Diagnosis not present

## 2018-11-26 DIAGNOSIS — D638 Anemia in other chronic diseases classified elsewhere: Secondary | ICD-10-CM | POA: Diagnosis not present

## 2018-11-26 DIAGNOSIS — D509 Iron deficiency anemia, unspecified: Secondary | ICD-10-CM | POA: Diagnosis not present

## 2018-11-26 DIAGNOSIS — M810 Age-related osteoporosis without current pathological fracture: Secondary | ICD-10-CM | POA: Diagnosis not present

## 2018-11-26 DIAGNOSIS — E039 Hypothyroidism, unspecified: Secondary | ICD-10-CM | POA: Diagnosis not present

## 2018-11-26 DIAGNOSIS — F324 Major depressive disorder, single episode, in partial remission: Secondary | ICD-10-CM | POA: Diagnosis not present

## 2018-11-26 DIAGNOSIS — E7800 Pure hypercholesterolemia, unspecified: Secondary | ICD-10-CM | POA: Diagnosis not present

## 2018-11-26 DIAGNOSIS — Z853 Personal history of malignant neoplasm of breast: Secondary | ICD-10-CM | POA: Diagnosis not present

## 2018-11-26 DIAGNOSIS — F330 Major depressive disorder, recurrent, mild: Secondary | ICD-10-CM | POA: Diagnosis not present

## 2018-11-26 DIAGNOSIS — N183 Chronic kidney disease, stage 3 (moderate): Secondary | ICD-10-CM | POA: Diagnosis not present

## 2018-12-02 DIAGNOSIS — J309 Allergic rhinitis, unspecified: Secondary | ICD-10-CM | POA: Diagnosis not present

## 2018-12-02 DIAGNOSIS — K7460 Unspecified cirrhosis of liver: Secondary | ICD-10-CM | POA: Diagnosis not present

## 2018-12-02 DIAGNOSIS — M810 Age-related osteoporosis without current pathological fracture: Secondary | ICD-10-CM | POA: Diagnosis not present

## 2018-12-02 DIAGNOSIS — D692 Other nonthrombocytopenic purpura: Secondary | ICD-10-CM | POA: Diagnosis not present

## 2018-12-02 DIAGNOSIS — R32 Unspecified urinary incontinence: Secondary | ICD-10-CM | POA: Diagnosis not present

## 2018-12-02 DIAGNOSIS — D509 Iron deficiency anemia, unspecified: Secondary | ICD-10-CM | POA: Diagnosis not present

## 2018-12-02 DIAGNOSIS — F419 Anxiety disorder, unspecified: Secondary | ICD-10-CM | POA: Diagnosis not present

## 2018-12-02 DIAGNOSIS — E039 Hypothyroidism, unspecified: Secondary | ICD-10-CM | POA: Diagnosis not present

## 2018-12-02 DIAGNOSIS — Z23 Encounter for immunization: Secondary | ICD-10-CM | POA: Diagnosis not present

## 2018-12-02 DIAGNOSIS — R188 Other ascites: Secondary | ICD-10-CM | POA: Diagnosis not present

## 2018-12-02 DIAGNOSIS — I10 Essential (primary) hypertension: Secondary | ICD-10-CM | POA: Diagnosis not present

## 2018-12-02 DIAGNOSIS — E7800 Pure hypercholesterolemia, unspecified: Secondary | ICD-10-CM | POA: Diagnosis not present

## 2019-02-15 DIAGNOSIS — Z853 Personal history of malignant neoplasm of breast: Secondary | ICD-10-CM | POA: Diagnosis not present

## 2019-02-15 DIAGNOSIS — I10 Essential (primary) hypertension: Secondary | ICD-10-CM | POA: Diagnosis not present

## 2019-02-15 DIAGNOSIS — E7800 Pure hypercholesterolemia, unspecified: Secondary | ICD-10-CM | POA: Diagnosis not present

## 2019-02-15 DIAGNOSIS — F324 Major depressive disorder, single episode, in partial remission: Secondary | ICD-10-CM | POA: Diagnosis not present

## 2019-02-15 DIAGNOSIS — D509 Iron deficiency anemia, unspecified: Secondary | ICD-10-CM | POA: Diagnosis not present

## 2019-02-15 DIAGNOSIS — E039 Hypothyroidism, unspecified: Secondary | ICD-10-CM | POA: Diagnosis not present

## 2019-02-15 DIAGNOSIS — I2510 Atherosclerotic heart disease of native coronary artery without angina pectoris: Secondary | ICD-10-CM | POA: Diagnosis not present

## 2019-02-15 DIAGNOSIS — D638 Anemia in other chronic diseases classified elsewhere: Secondary | ICD-10-CM | POA: Diagnosis not present

## 2019-02-15 DIAGNOSIS — M810 Age-related osteoporosis without current pathological fracture: Secondary | ICD-10-CM | POA: Diagnosis not present

## 2019-02-15 DIAGNOSIS — F330 Major depressive disorder, recurrent, mild: Secondary | ICD-10-CM | POA: Diagnosis not present

## 2019-03-23 DIAGNOSIS — K429 Umbilical hernia without obstruction or gangrene: Secondary | ICD-10-CM | POA: Diagnosis not present

## 2019-03-23 DIAGNOSIS — R141 Gas pain: Secondary | ICD-10-CM | POA: Diagnosis not present

## 2019-04-02 DIAGNOSIS — F324 Major depressive disorder, single episode, in partial remission: Secondary | ICD-10-CM | POA: Diagnosis not present

## 2019-04-02 DIAGNOSIS — I10 Essential (primary) hypertension: Secondary | ICD-10-CM | POA: Diagnosis not present

## 2019-04-02 DIAGNOSIS — Z853 Personal history of malignant neoplasm of breast: Secondary | ICD-10-CM | POA: Diagnosis not present

## 2019-04-02 DIAGNOSIS — I2510 Atherosclerotic heart disease of native coronary artery without angina pectoris: Secondary | ICD-10-CM | POA: Diagnosis not present

## 2019-04-02 DIAGNOSIS — M810 Age-related osteoporosis without current pathological fracture: Secondary | ICD-10-CM | POA: Diagnosis not present

## 2019-04-02 DIAGNOSIS — E039 Hypothyroidism, unspecified: Secondary | ICD-10-CM | POA: Diagnosis not present

## 2019-04-02 DIAGNOSIS — D638 Anemia in other chronic diseases classified elsewhere: Secondary | ICD-10-CM | POA: Diagnosis not present

## 2019-04-02 DIAGNOSIS — D509 Iron deficiency anemia, unspecified: Secondary | ICD-10-CM | POA: Diagnosis not present

## 2019-04-02 DIAGNOSIS — E7800 Pure hypercholesterolemia, unspecified: Secondary | ICD-10-CM | POA: Diagnosis not present

## 2019-04-02 DIAGNOSIS — F330 Major depressive disorder, recurrent, mild: Secondary | ICD-10-CM | POA: Diagnosis not present

## 2019-04-02 DIAGNOSIS — N183 Chronic kidney disease, stage 3 unspecified: Secondary | ICD-10-CM | POA: Diagnosis not present

## 2019-04-15 DIAGNOSIS — I10 Essential (primary) hypertension: Secondary | ICD-10-CM | POA: Diagnosis not present

## 2019-04-15 DIAGNOSIS — E039 Hypothyroidism, unspecified: Secondary | ICD-10-CM | POA: Diagnosis not present

## 2019-04-15 DIAGNOSIS — F330 Major depressive disorder, recurrent, mild: Secondary | ICD-10-CM | POA: Diagnosis not present

## 2019-04-15 DIAGNOSIS — F324 Major depressive disorder, single episode, in partial remission: Secondary | ICD-10-CM | POA: Diagnosis not present

## 2019-04-15 DIAGNOSIS — I2510 Atherosclerotic heart disease of native coronary artery without angina pectoris: Secondary | ICD-10-CM | POA: Diagnosis not present

## 2019-04-15 DIAGNOSIS — D509 Iron deficiency anemia, unspecified: Secondary | ICD-10-CM | POA: Diagnosis not present

## 2019-04-15 DIAGNOSIS — Z853 Personal history of malignant neoplasm of breast: Secondary | ICD-10-CM | POA: Diagnosis not present

## 2019-04-15 DIAGNOSIS — M810 Age-related osteoporosis without current pathological fracture: Secondary | ICD-10-CM | POA: Diagnosis not present

## 2019-04-15 DIAGNOSIS — E7800 Pure hypercholesterolemia, unspecified: Secondary | ICD-10-CM | POA: Diagnosis not present

## 2019-04-15 DIAGNOSIS — N183 Chronic kidney disease, stage 3 unspecified: Secondary | ICD-10-CM | POA: Diagnosis not present

## 2019-04-15 DIAGNOSIS — D638 Anemia in other chronic diseases classified elsewhere: Secondary | ICD-10-CM | POA: Diagnosis not present

## 2019-05-07 DIAGNOSIS — D638 Anemia in other chronic diseases classified elsewhere: Secondary | ICD-10-CM | POA: Diagnosis not present

## 2019-05-07 DIAGNOSIS — F324 Major depressive disorder, single episode, in partial remission: Secondary | ICD-10-CM | POA: Diagnosis not present

## 2019-05-07 DIAGNOSIS — Z853 Personal history of malignant neoplasm of breast: Secondary | ICD-10-CM | POA: Diagnosis not present

## 2019-05-07 DIAGNOSIS — I10 Essential (primary) hypertension: Secondary | ICD-10-CM | POA: Diagnosis not present

## 2019-05-07 DIAGNOSIS — D509 Iron deficiency anemia, unspecified: Secondary | ICD-10-CM | POA: Diagnosis not present

## 2019-05-07 DIAGNOSIS — F330 Major depressive disorder, recurrent, mild: Secondary | ICD-10-CM | POA: Diagnosis not present

## 2019-05-07 DIAGNOSIS — I2510 Atherosclerotic heart disease of native coronary artery without angina pectoris: Secondary | ICD-10-CM | POA: Diagnosis not present

## 2019-05-07 DIAGNOSIS — E7800 Pure hypercholesterolemia, unspecified: Secondary | ICD-10-CM | POA: Diagnosis not present

## 2019-05-07 DIAGNOSIS — G4700 Insomnia, unspecified: Secondary | ICD-10-CM | POA: Diagnosis not present

## 2019-05-07 DIAGNOSIS — M810 Age-related osteoporosis without current pathological fracture: Secondary | ICD-10-CM | POA: Diagnosis not present

## 2019-05-07 DIAGNOSIS — N183 Chronic kidney disease, stage 3 unspecified: Secondary | ICD-10-CM | POA: Diagnosis not present

## 2019-05-07 DIAGNOSIS — E039 Hypothyroidism, unspecified: Secondary | ICD-10-CM | POA: Diagnosis not present

## 2019-05-26 DIAGNOSIS — K7469 Other cirrhosis of liver: Secondary | ICD-10-CM | POA: Diagnosis not present

## 2019-06-08 DIAGNOSIS — I10 Essential (primary) hypertension: Secondary | ICD-10-CM | POA: Diagnosis not present

## 2019-06-08 DIAGNOSIS — J309 Allergic rhinitis, unspecified: Secondary | ICD-10-CM | POA: Diagnosis not present

## 2019-06-08 DIAGNOSIS — M810 Age-related osteoporosis without current pathological fracture: Secondary | ICD-10-CM | POA: Diagnosis not present

## 2019-06-08 DIAGNOSIS — E7800 Pure hypercholesterolemia, unspecified: Secondary | ICD-10-CM | POA: Diagnosis not present

## 2019-06-08 DIAGNOSIS — F330 Major depressive disorder, recurrent, mild: Secondary | ICD-10-CM | POA: Diagnosis not present

## 2019-06-08 DIAGNOSIS — D692 Other nonthrombocytopenic purpura: Secondary | ICD-10-CM | POA: Diagnosis not present

## 2019-06-08 DIAGNOSIS — K7460 Unspecified cirrhosis of liver: Secondary | ICD-10-CM | POA: Diagnosis not present

## 2019-06-08 DIAGNOSIS — E039 Hypothyroidism, unspecified: Secondary | ICD-10-CM | POA: Diagnosis not present

## 2019-06-08 DIAGNOSIS — R188 Other ascites: Secondary | ICD-10-CM | POA: Diagnosis not present

## 2019-06-08 DIAGNOSIS — D509 Iron deficiency anemia, unspecified: Secondary | ICD-10-CM | POA: Diagnosis not present

## 2019-06-08 DIAGNOSIS — F419 Anxiety disorder, unspecified: Secondary | ICD-10-CM | POA: Diagnosis not present

## 2019-06-08 DIAGNOSIS — Z0000 Encounter for general adult medical examination without abnormal findings: Secondary | ICD-10-CM | POA: Diagnosis not present

## 2019-07-02 DIAGNOSIS — D696 Thrombocytopenia, unspecified: Secondary | ICD-10-CM | POA: Diagnosis not present

## 2019-08-18 DIAGNOSIS — I2510 Atherosclerotic heart disease of native coronary artery without angina pectoris: Secondary | ICD-10-CM | POA: Diagnosis not present

## 2019-08-18 DIAGNOSIS — E7800 Pure hypercholesterolemia, unspecified: Secondary | ICD-10-CM | POA: Diagnosis not present

## 2019-08-18 DIAGNOSIS — G4700 Insomnia, unspecified: Secondary | ICD-10-CM | POA: Diagnosis not present

## 2019-08-18 DIAGNOSIS — F330 Major depressive disorder, recurrent, mild: Secondary | ICD-10-CM | POA: Diagnosis not present

## 2019-08-18 DIAGNOSIS — N183 Chronic kidney disease, stage 3 unspecified: Secondary | ICD-10-CM | POA: Diagnosis not present

## 2019-08-18 DIAGNOSIS — Z853 Personal history of malignant neoplasm of breast: Secondary | ICD-10-CM | POA: Diagnosis not present

## 2019-08-18 DIAGNOSIS — D509 Iron deficiency anemia, unspecified: Secondary | ICD-10-CM | POA: Diagnosis not present

## 2019-08-18 DIAGNOSIS — I10 Essential (primary) hypertension: Secondary | ICD-10-CM | POA: Diagnosis not present

## 2019-08-18 DIAGNOSIS — F324 Major depressive disorder, single episode, in partial remission: Secondary | ICD-10-CM | POA: Diagnosis not present

## 2019-08-18 DIAGNOSIS — E039 Hypothyroidism, unspecified: Secondary | ICD-10-CM | POA: Diagnosis not present

## 2019-08-18 DIAGNOSIS — M810 Age-related osteoporosis without current pathological fracture: Secondary | ICD-10-CM | POA: Diagnosis not present

## 2019-08-20 DIAGNOSIS — E7800 Pure hypercholesterolemia, unspecified: Secondary | ICD-10-CM | POA: Diagnosis not present

## 2019-08-20 DIAGNOSIS — D696 Thrombocytopenia, unspecified: Secondary | ICD-10-CM | POA: Diagnosis not present

## 2019-10-15 DIAGNOSIS — H9113 Presbycusis, bilateral: Secondary | ICD-10-CM | POA: Diagnosis not present

## 2019-10-15 DIAGNOSIS — H903 Sensorineural hearing loss, bilateral: Secondary | ICD-10-CM | POA: Diagnosis not present

## 2019-10-15 DIAGNOSIS — H9313 Tinnitus, bilateral: Secondary | ICD-10-CM | POA: Diagnosis not present

## 2019-11-29 DIAGNOSIS — D696 Thrombocytopenia, unspecified: Secondary | ICD-10-CM | POA: Diagnosis not present

## 2019-12-09 DIAGNOSIS — L0100 Impetigo, unspecified: Secondary | ICD-10-CM | POA: Diagnosis not present

## 2019-12-20 DIAGNOSIS — F419 Anxiety disorder, unspecified: Secondary | ICD-10-CM | POA: Diagnosis not present

## 2019-12-20 DIAGNOSIS — E039 Hypothyroidism, unspecified: Secondary | ICD-10-CM | POA: Diagnosis not present

## 2019-12-20 DIAGNOSIS — E7800 Pure hypercholesterolemia, unspecified: Secondary | ICD-10-CM | POA: Diagnosis not present

## 2019-12-20 DIAGNOSIS — R188 Other ascites: Secondary | ICD-10-CM | POA: Diagnosis not present

## 2019-12-20 DIAGNOSIS — D509 Iron deficiency anemia, unspecified: Secondary | ICD-10-CM | POA: Diagnosis not present

## 2019-12-20 DIAGNOSIS — D692 Other nonthrombocytopenic purpura: Secondary | ICD-10-CM | POA: Diagnosis not present

## 2019-12-20 DIAGNOSIS — I10 Essential (primary) hypertension: Secondary | ICD-10-CM | POA: Diagnosis not present

## 2019-12-20 DIAGNOSIS — Z23 Encounter for immunization: Secondary | ICD-10-CM | POA: Diagnosis not present

## 2019-12-20 DIAGNOSIS — F330 Major depressive disorder, recurrent, mild: Secondary | ICD-10-CM | POA: Diagnosis not present

## 2019-12-20 DIAGNOSIS — R7309 Other abnormal glucose: Secondary | ICD-10-CM | POA: Diagnosis not present

## 2019-12-20 DIAGNOSIS — M810 Age-related osteoporosis without current pathological fracture: Secondary | ICD-10-CM | POA: Diagnosis not present

## 2019-12-20 DIAGNOSIS — K7460 Unspecified cirrhosis of liver: Secondary | ICD-10-CM | POA: Diagnosis not present

## 2019-12-29 DIAGNOSIS — I10 Essential (primary) hypertension: Secondary | ICD-10-CM | POA: Diagnosis not present

## 2019-12-29 DIAGNOSIS — G4700 Insomnia, unspecified: Secondary | ICD-10-CM | POA: Diagnosis not present

## 2019-12-29 DIAGNOSIS — I2510 Atherosclerotic heart disease of native coronary artery without angina pectoris: Secondary | ICD-10-CM | POA: Diagnosis not present

## 2019-12-29 DIAGNOSIS — D509 Iron deficiency anemia, unspecified: Secondary | ICD-10-CM | POA: Diagnosis not present

## 2019-12-29 DIAGNOSIS — F330 Major depressive disorder, recurrent, mild: Secondary | ICD-10-CM | POA: Diagnosis not present

## 2019-12-29 DIAGNOSIS — Z853 Personal history of malignant neoplasm of breast: Secondary | ICD-10-CM | POA: Diagnosis not present

## 2019-12-29 DIAGNOSIS — F324 Major depressive disorder, single episode, in partial remission: Secondary | ICD-10-CM | POA: Diagnosis not present

## 2019-12-29 DIAGNOSIS — E039 Hypothyroidism, unspecified: Secondary | ICD-10-CM | POA: Diagnosis not present

## 2019-12-29 DIAGNOSIS — N183 Chronic kidney disease, stage 3 unspecified: Secondary | ICD-10-CM | POA: Diagnosis not present

## 2019-12-29 DIAGNOSIS — M810 Age-related osteoporosis without current pathological fracture: Secondary | ICD-10-CM | POA: Diagnosis not present

## 2019-12-29 DIAGNOSIS — E7800 Pure hypercholesterolemia, unspecified: Secondary | ICD-10-CM | POA: Diagnosis not present

## 2020-01-06 DIAGNOSIS — R188 Other ascites: Secondary | ICD-10-CM | POA: Diagnosis not present

## 2020-01-06 DIAGNOSIS — I8500 Esophageal varices without bleeding: Secondary | ICD-10-CM | POA: Diagnosis not present

## 2020-01-06 DIAGNOSIS — K7469 Other cirrhosis of liver: Secondary | ICD-10-CM | POA: Diagnosis not present

## 2020-01-20 DIAGNOSIS — Z853 Personal history of malignant neoplasm of breast: Secondary | ICD-10-CM | POA: Diagnosis not present

## 2020-01-20 DIAGNOSIS — D638 Anemia in other chronic diseases classified elsewhere: Secondary | ICD-10-CM | POA: Diagnosis not present

## 2020-01-20 DIAGNOSIS — I10 Essential (primary) hypertension: Secondary | ICD-10-CM | POA: Diagnosis not present

## 2020-01-20 DIAGNOSIS — E7800 Pure hypercholesterolemia, unspecified: Secondary | ICD-10-CM | POA: Diagnosis not present

## 2020-01-20 DIAGNOSIS — F330 Major depressive disorder, recurrent, mild: Secondary | ICD-10-CM | POA: Diagnosis not present

## 2020-01-20 DIAGNOSIS — G4700 Insomnia, unspecified: Secondary | ICD-10-CM | POA: Diagnosis not present

## 2020-01-20 DIAGNOSIS — I2510 Atherosclerotic heart disease of native coronary artery without angina pectoris: Secondary | ICD-10-CM | POA: Diagnosis not present

## 2020-01-20 DIAGNOSIS — F324 Major depressive disorder, single episode, in partial remission: Secondary | ICD-10-CM | POA: Diagnosis not present

## 2020-01-20 DIAGNOSIS — M810 Age-related osteoporosis without current pathological fracture: Secondary | ICD-10-CM | POA: Diagnosis not present

## 2020-01-20 DIAGNOSIS — E039 Hypothyroidism, unspecified: Secondary | ICD-10-CM | POA: Diagnosis not present

## 2020-01-20 DIAGNOSIS — N183 Chronic kidney disease, stage 3 unspecified: Secondary | ICD-10-CM | POA: Diagnosis not present

## 2020-01-20 DIAGNOSIS — D509 Iron deficiency anemia, unspecified: Secondary | ICD-10-CM | POA: Diagnosis not present

## 2020-03-09 DIAGNOSIS — H524 Presbyopia: Secondary | ICD-10-CM | POA: Diagnosis not present

## 2020-03-09 DIAGNOSIS — Z961 Presence of intraocular lens: Secondary | ICD-10-CM | POA: Diagnosis not present

## 2020-03-09 DIAGNOSIS — H43813 Vitreous degeneration, bilateral: Secondary | ICD-10-CM | POA: Diagnosis not present

## 2020-03-09 DIAGNOSIS — H52203 Unspecified astigmatism, bilateral: Secondary | ICD-10-CM | POA: Diagnosis not present

## 2020-03-29 DIAGNOSIS — M85852 Other specified disorders of bone density and structure, left thigh: Secondary | ICD-10-CM | POA: Diagnosis not present

## 2020-03-29 DIAGNOSIS — Z780 Asymptomatic menopausal state: Secondary | ICD-10-CM | POA: Diagnosis not present

## 2020-03-29 DIAGNOSIS — M810 Age-related osteoporosis without current pathological fracture: Secondary | ICD-10-CM | POA: Diagnosis not present

## 2020-06-01 DIAGNOSIS — H903 Sensorineural hearing loss, bilateral: Secondary | ICD-10-CM | POA: Diagnosis not present

## 2020-06-21 DIAGNOSIS — R188 Other ascites: Secondary | ICD-10-CM | POA: Diagnosis not present

## 2020-06-21 DIAGNOSIS — E7800 Pure hypercholesterolemia, unspecified: Secondary | ICD-10-CM | POA: Diagnosis not present

## 2020-06-21 DIAGNOSIS — K7460 Unspecified cirrhosis of liver: Secondary | ICD-10-CM | POA: Diagnosis not present

## 2020-06-21 DIAGNOSIS — F419 Anxiety disorder, unspecified: Secondary | ICD-10-CM | POA: Diagnosis not present

## 2020-06-21 DIAGNOSIS — Z0000 Encounter for general adult medical examination without abnormal findings: Secondary | ICD-10-CM | POA: Diagnosis not present

## 2020-06-21 DIAGNOSIS — R7303 Prediabetes: Secondary | ICD-10-CM | POA: Diagnosis not present

## 2020-06-21 DIAGNOSIS — I10 Essential (primary) hypertension: Secondary | ICD-10-CM | POA: Diagnosis not present

## 2020-06-21 DIAGNOSIS — M810 Age-related osteoporosis without current pathological fracture: Secondary | ICD-10-CM | POA: Diagnosis not present

## 2020-06-21 DIAGNOSIS — F330 Major depressive disorder, recurrent, mild: Secondary | ICD-10-CM | POA: Diagnosis not present

## 2020-06-21 DIAGNOSIS — E039 Hypothyroidism, unspecified: Secondary | ICD-10-CM | POA: Diagnosis not present

## 2020-06-21 DIAGNOSIS — J309 Allergic rhinitis, unspecified: Secondary | ICD-10-CM | POA: Diagnosis not present

## 2020-06-21 DIAGNOSIS — D509 Iron deficiency anemia, unspecified: Secondary | ICD-10-CM | POA: Diagnosis not present

## 2020-07-05 DIAGNOSIS — R188 Other ascites: Secondary | ICD-10-CM | POA: Diagnosis not present

## 2020-07-05 DIAGNOSIS — I8500 Esophageal varices without bleeding: Secondary | ICD-10-CM | POA: Diagnosis not present

## 2020-07-05 DIAGNOSIS — K7469 Other cirrhosis of liver: Secondary | ICD-10-CM | POA: Diagnosis not present

## 2020-07-05 DIAGNOSIS — D6959 Other secondary thrombocytopenia: Secondary | ICD-10-CM | POA: Diagnosis not present

## 2020-07-19 ENCOUNTER — Telehealth: Payer: Self-pay | Admitting: Oncology

## 2020-07-19 NOTE — Telephone Encounter (Signed)
I received a new hem referral from Dr. Alyson Ingles for thrombocytopenia. Jenna [name] has seen Dr. Jana Hakim in the past for breast cancer. She has been scheduled to see him on 5/19 at 4pm w/lab at 330pm.

## 2020-07-19 NOTE — Progress Notes (Signed)
[facility]  [phone] [phone]     ID: Jenna [name] DOB: [birth year]  MR#: [id number]  TDH#:[id number]  Patient Care Team: Maury Dus, MD as PCP - General (Family Medicine) Adelin Ventrella, Virgie Dad, MD as Consulting Physician (Oncology) Chauncey Cruel, MD OTHER MD:  CHIEF COMPLAINT: Thrombocytopenia  CURRENT TREATMENT: Observation   HISTORY OF CURRENT ILLNESS: Jenna [name] is a former breast cancer patient whom I released from follow-up here in 2017.  She is now followed by Dr. Alyson Ingles and he has noted a steady decline in her platelet count over the past 2 years, with the most recent reading in the 70,000 range.  The patient was referred for further evaluation of her thrombocytopenia.  The patient's subsequent history is as detailed below.   INTERVAL HISTORY: Jenna [name] was evaluated in the hematology clinic on 07/20/2020.  She drove herself to this visit   REVIEW OF SYSTEMS: The patient denies unusual headaches, visual changes, nausea, vomiting, stiff neck, dizziness, or gait imbalance. There has been no cough, phlegm production, or pleurisy, no chest pain or pressure, and no change in bowel or bladder habits.  She denies altered taste, loss of appetite, or abdominal discomfort or swelling.  The patient denies fever, rash, bleeding, unexplained fatigue or unexplained weight loss.  She exercises by walking which she does mostly inside in the halls at [employer].  She does not participate in their exercise program.  A detailed review of systems was otherwise entirely negative.   COVID 19 VACCINATION STATUS: Moderna x2 plus 2 boosters, most recently May 2022   PAST MEDICAL HISTORY: Past Medical History:  Diagnosis Date  . Anxiety   . Breast cancer, IDC, Right, Stage I 11/07/2010   right breast lumpectomy surgery with radiation  . Constipation   . Gall stones, common bile duct   . Hypercholesteremia   . Hypertension   . Osteoporosis   .  Pneumonia 2016  . Sepsis ([place]) 02/2015    PAST SURGICAL HISTORY: Past Surgical History:  Procedure Laterality Date  . BREAST LUMPECTOMY W/ NEEDLE LOCALIZATION  11/26/2010   Right - Dr Margot Chimes  . BREAST SURGERY    . ERCP N/A 10/11/2015   Procedure: ENDOSCOPIC RETROGRADE CHOLANGIOPANCREATOGRAPHY (ERCP);  Surgeon: Arta Silence, MD;  Location: [facility];  Service: Endoscopy;  Laterality: N/A;  . ERCP N/A 12/06/2015   Procedure: ENDOSCOPIC RETROGRADE CHOLANGIOPANCREATOGRAPHY (ERCP);  Surgeon: Arta Silence, MD;  Location: [facility];  Service: Endoscopy;  Laterality: N/A;  Dr. Watt Climes to assist  . EUS N/A 10/11/2015   Procedure: ESOPHAGEAL ENDOSCOPIC ULTRASOUND (EUS) RADIAL;  Surgeon: Arta Silence, MD;  Location: [facility];  Service: Endoscopy;  Laterality: N/A;  . HEMORRHOID SURGERY    . TONSILLECTOMY    . TUMOR REMOVAL  1960, 1970   benign tumor from bilateral breast    FAMILY HISTORY: Family History  Problem Relation Age of Onset  . Cancer Mother        lung  . Heart disease Mother   . Heart disease Father   . Heart disease Brother   . Heart disease Paternal Uncle   . Heart disease Maternal Grandmother   . Stroke Maternal Grandfather   . Heart disease Paternal Grandfather   The patient's father died in his 73s from heart disease.  The patient's mother died at 3 from lung cancer.  Both were heavy smokers.  The patient had no sisters.  She had 1 brother who died from heart disease at age 55.  GYNECOLOGIC HISTORY:  No LMP recorded. Patient is postmenopausal. Menarche: 87 years old Age at first live birth: 87 years old GX P 2 LMP 52 HRT "several years"    SOCIAL HISTORY: (updated May 2022) Tatym is originally from [place] moved here when her husband (who was from [place], [place]) married her.  He died when he was 65.  She now lives by herself in [place], [place], died from heart disease at the age of 88, leaving no children.  Daughter, Jenna [name], lives in  [place].  The patient is very proud of her 4 grandchildren 1 of whom works in the transplant center at [facility] as a Designer, jewellery, another is a Animal nutritionist, another 1 is in Engineer, mining and another one is an Dietitian for [employer].  Kharter attends the synagogue and occasionally the temple.   ADVANCED DIRECTIVES: Her daughter Jenna [name] is her healthcare power of attorney   HEALTH MAINTENANCE: Social History   Tobacco Use  . Smoking status: Never Smoker  . Smokeless tobacco: Never Used  Substance Use Topics  . Alcohol use: No  . Drug use: No     Colonoscopy:    Bone density: Jan 2022: "severe osteoporosis"   Allergies  Allergen Reactions  . Iodine Itching and Rash    Iodine IV  . Lactose Intolerance (Gi) Diarrhea  . Naproxen Itching  . Wound Dressing Adhesive Dermatitis  . Sulfa Antibiotics Itching and Rash    Current Outpatient Medications  Medication Sig Dispense Refill  . atenolol (TENORMIN) 50 MG tablet Take 50 mg by mouth daily.     . cholecalciferol (VITAMIN D) 1000 UNITS tablet Take 1,000 Units by mouth daily.    . furosemide (LASIX) 20 MG tablet Take 20 mg by mouth.    . iron polysaccharides (NIFEREX) 150 MG capsule 1 capsule    . LORazepam (ATIVAN) 1 MG tablet Take 0.5-1 mg by mouth 2 (two) times daily as needed for anxiety or sleep.    Marland Kitchen losartan (COZAAR) 100 MG tablet Take 100 mg by mouth daily.  1  . Misc Natural Products (LUTEIN 20 PO) Take 1 tablet by mouth daily. Reported on 06/22/2015    . rosuvastatin (CRESTOR) 5 MG tablet Take 5 mg by mouth 2 (two) times a week.    . senna (SENOKOT) 8.6 MG tablet Take 1-2 tablets by mouth at bedtime.     . sertraline (ZOLOFT) 100 MG tablet Take 100 mg by mouth daily.    Marland Kitchen acetaminophen (TYLENOL) 500 MG tablet Take 500 mg by mouth every 6 (six) hours as needed. (Patient not taking: Reported on 07/20/2020)     No current facility-administered medications for this visit.    OBJECTIVE: White woman who  appears younger than stated age  52:   07/20/20 1555  BP: (!) 142/62  Pulse: 71  Resp: 17  Temp: (!) 97.5 F (36.4 C)  SpO2: 97%     Body mass index is 22.36 kg/m.   Wt Readings from Last 3 Encounters:  07/20/20 110 lb 11.2 oz (50.2 kg)  12/26/15 102 lb 8 oz (46.5 kg)  12/06/15 105 lb (47.6 kg)      ECOG FS:1 - Symptomatic but completely ambulatory  Ocular: Sclerae unicteric, pupils round and equal Ear-nose-throat: Wearing a mask Lymphatic: No cervical or supraclavicular adenopathy Lungs no rales or rhonchi Heart regular rate and rhythm Abd soft, nontender, positive bowel sounds MSK kyphosis and significant scoliosis but no focal spinal tenderness Neuro: non-focal, well-oriented,  appropriate affect Breasts: The right breast is status post a remote lumpectomy and radiation.  There is a very firm scar immediately subjacent to the incision.  Left breast and both axillae are benign.   LAB RESULTS:  CMP     Component Value Date/Time   NA 141 07/20/2020 1540   NA 135 (L) 12/26/2015 1000   K 3.3 (L) 07/20/2020 1540   K 4.0 12/26/2015 1000   CL 104 07/20/2020 1540   CL 99 11/07/2010 0825   CO2 25 07/20/2020 1540   CO2 23 12/26/2015 1000   GLUCOSE 107 (H) 07/20/2020 1540   GLUCOSE 117 12/26/2015 1000   GLUCOSE 109 11/07/2010 0825   BUN 13 07/20/2020 1540   BUN 14.0 12/26/2015 1000   CREATININE 0.90 07/20/2020 1540   CREATININE 0.8 12/26/2015 1000   CALCIUM 9.6 07/20/2020 1540   CALCIUM 8.8 12/26/2015 1000   PROT 7.0 07/20/2020 1540   PROT 6.4 12/26/2015 1000   ALBUMIN 4.0 07/20/2020 1540   ALBUMIN 2.1 (L) 12/26/2015 1000   AST 40 07/20/2020 1540   AST 59 (H) 12/26/2015 1000   ALT 30 07/20/2020 1540   ALT 22 12/26/2015 1000   ALKPHOS 177 (H) 07/20/2020 1540   ALKPHOS 892 (H) 12/26/2015 1000   BILITOT 1.0 07/20/2020 1540   BILITOT 2.81 (H) 12/26/2015 1000   GFRNONAA >60 07/20/2020 1540   GFRAA >60 02/09/2015 0700    No results found for: TOTALPROTELP,  ALBUMINELP, A1GS, A2GS, BETS, BETA2SER, GAMS, MSPIKE, SPEI  Lab Results  Component Value Date   WBC 3.4 (L) 07/20/2020   NEUTROABS 2.2 07/20/2020   HGB 13.3 07/20/2020   HCT 40.6 07/20/2020   MCV 88.5 07/20/2020   PLT 93 (L) 07/20/2020    Lab Results  Component Value Date   LABCA2 25 11/07/2010    No components found for: RWERXV400  No results for input(s): INR in the last 168 hours.  Lab Results  Component Value Date   LABCA2 25 11/07/2010    No results found for: QQP619  No results found for: JKD326  No results found for: ZTI458  No results found for: CA2729  No components found for: HGQUANT  No results found for: CEA1 / No results found for: CEA1   No results found for: AFPTUMOR  No results found for: CHROMOGRNA  No results found for: KPAFRELGTCHN, LAMBDASER, KAPLAMBRATIO (kappa/lambda light chains)  No results found for: HGBA, HGBA2QUANT, HGBFQUANT, HGBSQUAN (Hemoglobinopathy evaluation)   Lab Results  Component Value Date   LDH 198 (H) 07/20/2020    No results found for: IRON, TIBC, IRONPCTSAT (Iron and TIBC)  No results found for: FERRITIN  Urinalysis    Component Value Date/Time   COLORURINE AMBER (A) 01/29/2015 1448   APPEARANCEUR CLOUDY (A) 01/29/2015 1448   LABSPEC 1.012 01/29/2015 1448   PHURINE 6.0 01/29/2015 1448   GLUCOSEU NEGATIVE 01/29/2015 1448   HGBUR NEGATIVE 01/29/2015 1448   BILIRUBINUR SMALL (A) 01/29/2015 1448   KETONESUR 15 (A) 01/29/2015 1448   PROTEINUR 100 (A) 01/29/2015 1448   NITRITE NEGATIVE 01/29/2015 1448   LEUKOCYTESUR NEGATIVE 01/29/2015 1448     STUDIES: No results found.   ELIGIBLE FOR AVAILABLE RESEARCH PROTOCOL: no  ASSESSMENT: 87 y.o. [place] woman with a prior history of breast cancer, referred to May 2022 for progressive thrombocytopenia  1.  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  2.  Status post radiation therapy from 01/15/2011 through 02/12/2011.  3.  Status post genetic testing with [facility] Personalized Breast Cancer Genomic Profile - breast cancer recurrence assay 70 gene signature prognostic and predictive tumor analysis shows high risk with a 10 year distant metastasis-free survival prior to treatment 71% (baseline risk or distant metastases 29%): These patients can expect their risk to be reduced with adjuvant chemotherapy.  However, the patient declined adjuvant chemotherapy.  4.  Thrombocytopenia:   (a) CT of the abdomen and pelvis 01/17/2016 shows hepatic findings consistent with early cirrhosis and spleen at the upper normal size  REVIEW OF BLOOD FILM 07/20/2020: The red cells show no significant anisocytosis, they are with a hemoglobin 9, there are no increased rouleaux, no targets, no nucleated red blood cells.  The white cell series are morphologically unremarkable, with no left shift.  There are no artifactual platelet clumps  PLAN: I met today with Liridona to review her diagnosis of thrombocytopenia.  We discussed the fact that all blood cells are made in the marrow bones and that sometimes for that reason we do a bone marrow biopsy to investigate low counts.  There are 3 types of blood cells.  Specifically red cells carry oxygen and she has morphologically normal red cells in normal numbers.  There is no anemia.  A second type of blood cell is the white cells.  This is the immune system circulating.  She has slightly low numbers of white cells but morphologically they are totally normal and there is no left shift.  The differential and the review of the blood film under microscopy are unremarkable and there is no evidence of leukemia.  The third type of blood cell is the platelets.  These are clotting cells.  She has about half the normal number in circulation.  There is no evidence of artifactual clumping to explain the low  circulating number  Since one of the 3 cell lines is entirely normal this means a primary bone marrow problem is unlikely and the review of the blood film is also consistent with this.  And explanation that would account for the low white cells and low platelets is splenomegaly.  The patient had evidence of early cirrhosis and spleen "at the upper limits of normal" 5 years ago.  I do not have more recent films.  She understands that all the blood that goes through the spleen next goes through the liver and the cirrhosis, which means scarring of the liver, makes it harder for the blood to flow through the liver.  This causes a "traffic jam" in the liver which backs up into the spleen and other organs.  The upshot of this is that she very likely has a normal number of white cells and platelets but an increased number of them are not circulating.  They are instead "parked" in the spleen.  We could work to confirm this with an ultrasound of the abdomen but I really do not think we need to do that.  So long as her platelet count is over 50,000 she is not particularly increased risk of bleeding.  My only suggestion is for Dr. Mariea Clonts to continue to follow our CBC.  Keeara tells me she sees them twice a year and he does lab work at every visit.  Certainly if her platelet count were to drop below 50,000 on at least 2 consecutive occasions she should be referred back for further evaluation.  Otherwise observation alone is all that is  required  Total encounter time 55 minutes.Jazelle Achey has a good understanding of the overall plan. she agrees with it. she will call with any problems that may develop before her next visit here.   Virgie Dad. Zuriah Bordas, MD 07/20/2020 5:04 PM Medical Oncology and Hematology [facility] [place], [place] [address] Tel. [phone]    Fax. [phone]   This document serves as a record of services personally performed by Lurline Del, MD. It was  created on his behalf by Wilburn Mylar, a trained medical scribe. The creation of this record is based on the scribe's personal observations and the provider's statements to them.   I, Lurline Del MD, have reviewed the above documentation for accuracy and completeness, and I agree with the above.    *Total Encounter Time as defined by the Centers for Medicare and Medicaid Services includes, in addition to the face-to-face time of a patient visit (documented in the note above) non-face-to-face time: obtaining and reviewing outside history, ordering and reviewing medications, tests or procedures, care coordination (communications with other health care professionals or caregivers) and documentation in the medical record.

## 2020-07-20 ENCOUNTER — Inpatient Hospital Stay: Payer: PPO | Attending: Oncology

## 2020-07-20 ENCOUNTER — Other Ambulatory Visit: Payer: Self-pay | Admitting: *Deleted

## 2020-07-20 ENCOUNTER — Encounter: Payer: Self-pay | Admitting: Oncology

## 2020-07-20 ENCOUNTER — Inpatient Hospital Stay (HOSPITAL_BASED_OUTPATIENT_CLINIC_OR_DEPARTMENT_OTHER): Payer: PPO | Admitting: Oncology

## 2020-07-20 ENCOUNTER — Other Ambulatory Visit: Payer: Self-pay

## 2020-07-20 VITALS — BP 142/62 | HR 71 | Temp 97.5°F | Resp 17 | Wt 110.7 lb

## 2020-07-20 DIAGNOSIS — Z923 Personal history of irradiation: Secondary | ICD-10-CM

## 2020-07-20 DIAGNOSIS — D696 Thrombocytopenia, unspecified: Secondary | ICD-10-CM | POA: Insufficient documentation

## 2020-07-20 DIAGNOSIS — Z801 Family history of malignant neoplasm of trachea, bronchus and lung: Secondary | ICD-10-CM

## 2020-07-20 DIAGNOSIS — Z853 Personal history of malignant neoplasm of breast: Secondary | ICD-10-CM | POA: Diagnosis not present

## 2020-07-20 DIAGNOSIS — C50411 Malignant neoplasm of upper-outer quadrant of right female breast: Secondary | ICD-10-CM

## 2020-07-20 LAB — COMPREHENSIVE METABOLIC PANEL
ALT: 30 U/L (ref 0–44)
AST: 40 U/L (ref 15–41)
Albumin: 4 g/dL (ref 3.5–5.0)
Alkaline Phosphatase: 177 U/L — ABNORMAL HIGH (ref 38–126)
Anion gap: 12 (ref 5–15)
BUN: 13 mg/dL (ref 8–23)
CO2: 25 mmol/L (ref 22–32)
Calcium: 9.6 mg/dL (ref 8.9–10.3)
Chloride: 104 mmol/L (ref 98–111)
Creatinine, Ser: 0.9 mg/dL (ref 0.44–1.00)
GFR, Estimated: >60 mL/min (ref >60)
Glucose, Bld: 107 mg/dL — ABNORMAL HIGH (ref 70–99)
Potassium: 3.3 mmol/L — ABNORMAL LOW (ref 3.5–5.1)
Sodium: 141 mmol/L (ref 135–145)
Total Bilirubin: 1 mg/dL (ref 0.3–1.2)
Total Protein: 7 g/dL (ref 6.5–8.1)

## 2020-07-20 LAB — CBC WITH DIFFERENTIAL/PLATELET
Abs Immature Granulocytes: 0.01 10*3/uL (ref 0.00–0.07)
Basophils Absolute: 0 10*3/uL (ref 0.0–0.1)
Basophils Relative: 1 %
Eosinophils Absolute: 0.1 10*3/uL (ref 0.0–0.5)
Eosinophils Relative: 2 %
HCT: 40.6 % (ref 36.0–46.0)
Hemoglobin: 13.3 g/dL (ref 12.0–15.0)
Immature Granulocytes: 0 %
Lymphocytes Relative: 22 %
Lymphs Abs: 0.8 10*3/uL (ref 0.7–4.0)
MCH: 29 pg (ref 26.0–34.0)
MCHC: 32.8 g/dL (ref 30.0–36.0)
MCV: 88.5 fL (ref 80.0–100.0)
Monocytes Absolute: 0.4 10*3/uL (ref 0.1–1.0)
Monocytes Relative: 11 %
Neutro Abs: 2.2 10*3/uL (ref 1.7–7.7)
Neutrophils Relative %: 64 %
Platelets: 93 10*3/uL — ABNORMAL LOW (ref 150–400)
RBC: 4.59 MIL/uL (ref 3.87–5.11)
RDW: 15 % (ref 11.5–15.5)
WBC: 3.4 10*3/uL — ABNORMAL LOW (ref 4.0–10.5)
nRBC: 0 % (ref 0.0–0.2)

## 2020-07-20 LAB — SAVE SMEAR(SSMR), FOR PROVIDER SLIDE REVIEW

## 2020-07-20 LAB — LACTATE DEHYDROGENASE: LDH: 198 U/L — ABNORMAL HIGH (ref 98–192)

## 2020-07-21 LAB — ANTINUCLEAR ANTIBODIES, IFA: ANA Ab, IFA: NEGATIVE

## 2020-09-06 DIAGNOSIS — E7800 Pure hypercholesterolemia, unspecified: Secondary | ICD-10-CM | POA: Diagnosis not present

## 2020-09-18 DIAGNOSIS — D509 Iron deficiency anemia, unspecified: Secondary | ICD-10-CM | POA: Diagnosis not present

## 2020-09-18 DIAGNOSIS — F330 Major depressive disorder, recurrent, mild: Secondary | ICD-10-CM | POA: Diagnosis not present

## 2020-09-18 DIAGNOSIS — M810 Age-related osteoporosis without current pathological fracture: Secondary | ICD-10-CM | POA: Diagnosis not present

## 2020-09-18 DIAGNOSIS — G4700 Insomnia, unspecified: Secondary | ICD-10-CM | POA: Diagnosis not present

## 2020-09-18 DIAGNOSIS — I2510 Atherosclerotic heart disease of native coronary artery without angina pectoris: Secondary | ICD-10-CM | POA: Diagnosis not present

## 2020-09-18 DIAGNOSIS — E7800 Pure hypercholesterolemia, unspecified: Secondary | ICD-10-CM | POA: Diagnosis not present

## 2020-09-18 DIAGNOSIS — D638 Anemia in other chronic diseases classified elsewhere: Secondary | ICD-10-CM | POA: Diagnosis not present

## 2020-09-18 DIAGNOSIS — N183 Chronic kidney disease, stage 3 unspecified: Secondary | ICD-10-CM | POA: Diagnosis not present

## 2020-09-18 DIAGNOSIS — I10 Essential (primary) hypertension: Secondary | ICD-10-CM | POA: Diagnosis not present

## 2020-09-18 DIAGNOSIS — E039 Hypothyroidism, unspecified: Secondary | ICD-10-CM | POA: Diagnosis not present

## 2020-11-15 DIAGNOSIS — F330 Major depressive disorder, recurrent, mild: Secondary | ICD-10-CM | POA: Diagnosis not present

## 2020-11-15 DIAGNOSIS — E7800 Pure hypercholesterolemia, unspecified: Secondary | ICD-10-CM | POA: Diagnosis not present

## 2020-11-15 DIAGNOSIS — M810 Age-related osteoporosis without current pathological fracture: Secondary | ICD-10-CM | POA: Diagnosis not present

## 2020-11-15 DIAGNOSIS — E039 Hypothyroidism, unspecified: Secondary | ICD-10-CM | POA: Diagnosis not present

## 2020-11-15 DIAGNOSIS — I2510 Atherosclerotic heart disease of native coronary artery without angina pectoris: Secondary | ICD-10-CM | POA: Diagnosis not present

## 2020-11-15 DIAGNOSIS — N183 Chronic kidney disease, stage 3 unspecified: Secondary | ICD-10-CM | POA: Diagnosis not present

## 2020-11-15 DIAGNOSIS — I10 Essential (primary) hypertension: Secondary | ICD-10-CM | POA: Diagnosis not present

## 2020-11-15 DIAGNOSIS — D638 Anemia in other chronic diseases classified elsewhere: Secondary | ICD-10-CM | POA: Diagnosis not present

## 2020-11-15 DIAGNOSIS — G4700 Insomnia, unspecified: Secondary | ICD-10-CM | POA: Diagnosis not present

## 2020-12-11 DIAGNOSIS — N183 Chronic kidney disease, stage 3 unspecified: Secondary | ICD-10-CM | POA: Diagnosis not present

## 2020-12-11 DIAGNOSIS — F330 Major depressive disorder, recurrent, mild: Secondary | ICD-10-CM | POA: Diagnosis not present

## 2020-12-11 DIAGNOSIS — D509 Iron deficiency anemia, unspecified: Secondary | ICD-10-CM | POA: Diagnosis not present

## 2020-12-11 DIAGNOSIS — E7800 Pure hypercholesterolemia, unspecified: Secondary | ICD-10-CM | POA: Diagnosis not present

## 2020-12-11 DIAGNOSIS — E039 Hypothyroidism, unspecified: Secondary | ICD-10-CM | POA: Diagnosis not present

## 2020-12-11 DIAGNOSIS — M810 Age-related osteoporosis without current pathological fracture: Secondary | ICD-10-CM | POA: Diagnosis not present

## 2020-12-11 DIAGNOSIS — D638 Anemia in other chronic diseases classified elsewhere: Secondary | ICD-10-CM | POA: Diagnosis not present

## 2020-12-11 DIAGNOSIS — I2510 Atherosclerotic heart disease of native coronary artery without angina pectoris: Secondary | ICD-10-CM | POA: Diagnosis not present

## 2020-12-11 DIAGNOSIS — G4700 Insomnia, unspecified: Secondary | ICD-10-CM | POA: Diagnosis not present

## 2020-12-11 DIAGNOSIS — F324 Major depressive disorder, single episode, in partial remission: Secondary | ICD-10-CM | POA: Diagnosis not present

## 2020-12-11 DIAGNOSIS — I10 Essential (primary) hypertension: Secondary | ICD-10-CM | POA: Diagnosis not present

## 2020-12-18 DIAGNOSIS — R188 Other ascites: Secondary | ICD-10-CM | POA: Diagnosis not present

## 2020-12-18 DIAGNOSIS — R7303 Prediabetes: Secondary | ICD-10-CM | POA: Diagnosis not present

## 2020-12-18 DIAGNOSIS — M810 Age-related osteoporosis without current pathological fracture: Secondary | ICD-10-CM | POA: Diagnosis not present

## 2020-12-18 DIAGNOSIS — G4700 Insomnia, unspecified: Secondary | ICD-10-CM | POA: Diagnosis not present

## 2020-12-18 DIAGNOSIS — D696 Thrombocytopenia, unspecified: Secondary | ICD-10-CM | POA: Diagnosis not present

## 2020-12-18 DIAGNOSIS — I10 Essential (primary) hypertension: Secondary | ICD-10-CM | POA: Diagnosis not present

## 2020-12-18 DIAGNOSIS — E039 Hypothyroidism, unspecified: Secondary | ICD-10-CM | POA: Diagnosis not present

## 2020-12-18 DIAGNOSIS — F324 Major depressive disorder, single episode, in partial remission: Secondary | ICD-10-CM | POA: Diagnosis not present

## 2020-12-18 DIAGNOSIS — E7800 Pure hypercholesterolemia, unspecified: Secondary | ICD-10-CM | POA: Diagnosis not present

## 2020-12-18 DIAGNOSIS — F419 Anxiety disorder, unspecified: Secondary | ICD-10-CM | POA: Diagnosis not present

## 2021-01-11 DIAGNOSIS — R188 Other ascites: Secondary | ICD-10-CM | POA: Diagnosis not present

## 2021-01-11 DIAGNOSIS — K7469 Other cirrhosis of liver: Secondary | ICD-10-CM | POA: Diagnosis not present

## 2021-01-11 DIAGNOSIS — R143 Flatulence: Secondary | ICD-10-CM | POA: Diagnosis not present

## 2021-01-17 DIAGNOSIS — J3489 Other specified disorders of nose and nasal sinuses: Secondary | ICD-10-CM | POA: Diagnosis not present

## 2021-01-31 DIAGNOSIS — E039 Hypothyroidism, unspecified: Secondary | ICD-10-CM | POA: Diagnosis not present

## 2021-02-14 DIAGNOSIS — Z1159 Encounter for screening for other viral diseases: Secondary | ICD-10-CM | POA: Diagnosis not present

## 2021-02-14 DIAGNOSIS — Z20828 Contact with and (suspected) exposure to other viral communicable diseases: Secondary | ICD-10-CM | POA: Diagnosis not present

## 2021-02-16 DIAGNOSIS — Z1159 Encounter for screening for other viral diseases: Secondary | ICD-10-CM | POA: Diagnosis not present

## 2021-02-16 DIAGNOSIS — Z20828 Contact with and (suspected) exposure to other viral communicable diseases: Secondary | ICD-10-CM | POA: Diagnosis not present

## 2021-02-19 DIAGNOSIS — Z20828 Contact with and (suspected) exposure to other viral communicable diseases: Secondary | ICD-10-CM | POA: Diagnosis not present

## 2021-02-19 DIAGNOSIS — Z1159 Encounter for screening for other viral diseases: Secondary | ICD-10-CM | POA: Diagnosis not present

## 2021-03-05 DIAGNOSIS — Z20828 Contact with and (suspected) exposure to other viral communicable diseases: Secondary | ICD-10-CM | POA: Diagnosis not present

## 2021-03-15 DIAGNOSIS — H43813 Vitreous degeneration, bilateral: Secondary | ICD-10-CM | POA: Diagnosis not present

## 2021-03-15 DIAGNOSIS — Z961 Presence of intraocular lens: Secondary | ICD-10-CM | POA: Diagnosis not present

## 2021-03-15 DIAGNOSIS — H5211 Myopia, right eye: Secondary | ICD-10-CM | POA: Diagnosis not present

## 2021-03-15 DIAGNOSIS — H5202 Hypermetropia, left eye: Secondary | ICD-10-CM | POA: Diagnosis not present

## 2021-03-19 DIAGNOSIS — Z20828 Contact with and (suspected) exposure to other viral communicable diseases: Secondary | ICD-10-CM | POA: Diagnosis not present

## 2021-03-30 DIAGNOSIS — M791 Myalgia, unspecified site: Secondary | ICD-10-CM | POA: Diagnosis not present

## 2021-03-30 DIAGNOSIS — M5416 Radiculopathy, lumbar region: Secondary | ICD-10-CM | POA: Diagnosis not present

## 2021-05-23 DIAGNOSIS — G4700 Insomnia, unspecified: Secondary | ICD-10-CM | POA: Diagnosis not present

## 2021-05-23 DIAGNOSIS — E7800 Pure hypercholesterolemia, unspecified: Secondary | ICD-10-CM | POA: Diagnosis not present

## 2021-05-23 DIAGNOSIS — I10 Essential (primary) hypertension: Secondary | ICD-10-CM | POA: Diagnosis not present

## 2021-05-23 DIAGNOSIS — F330 Major depressive disorder, recurrent, mild: Secondary | ICD-10-CM | POA: Diagnosis not present

## 2021-05-23 DIAGNOSIS — E039 Hypothyroidism, unspecified: Secondary | ICD-10-CM | POA: Diagnosis not present

## 2021-07-09 DIAGNOSIS — K7460 Unspecified cirrhosis of liver: Secondary | ICD-10-CM | POA: Diagnosis not present

## 2021-07-09 DIAGNOSIS — D509 Iron deficiency anemia, unspecified: Secondary | ICD-10-CM | POA: Diagnosis not present

## 2021-07-09 DIAGNOSIS — E46 Unspecified protein-calorie malnutrition: Secondary | ICD-10-CM | POA: Diagnosis not present

## 2021-07-09 DIAGNOSIS — Z0000 Encounter for general adult medical examination without abnormal findings: Secondary | ICD-10-CM | POA: Diagnosis not present

## 2021-07-09 DIAGNOSIS — E039 Hypothyroidism, unspecified: Secondary | ICD-10-CM | POA: Diagnosis not present

## 2021-07-09 DIAGNOSIS — I10 Essential (primary) hypertension: Secondary | ICD-10-CM | POA: Diagnosis not present

## 2021-07-09 DIAGNOSIS — R188 Other ascites: Secondary | ICD-10-CM | POA: Diagnosis not present

## 2021-07-09 DIAGNOSIS — F419 Anxiety disorder, unspecified: Secondary | ICD-10-CM | POA: Diagnosis not present

## 2021-07-09 DIAGNOSIS — R7303 Prediabetes: Secondary | ICD-10-CM | POA: Diagnosis not present

## 2021-07-09 DIAGNOSIS — K9289 Other specified diseases of the digestive system: Secondary | ICD-10-CM | POA: Diagnosis not present

## 2021-07-09 DIAGNOSIS — E7800 Pure hypercholesterolemia, unspecified: Secondary | ICD-10-CM | POA: Diagnosis not present

## 2021-07-09 DIAGNOSIS — F330 Major depressive disorder, recurrent, mild: Secondary | ICD-10-CM | POA: Diagnosis not present

## 2021-07-11 DIAGNOSIS — R188 Other ascites: Secondary | ICD-10-CM | POA: Diagnosis not present

## 2021-07-11 DIAGNOSIS — I8500 Esophageal varices without bleeding: Secondary | ICD-10-CM | POA: Diagnosis not present

## 2021-07-11 DIAGNOSIS — K7469 Other cirrhosis of liver: Secondary | ICD-10-CM | POA: Diagnosis not present

## 2021-08-02 DIAGNOSIS — E7800 Pure hypercholesterolemia, unspecified: Secondary | ICD-10-CM | POA: Diagnosis not present

## 2021-11-14 DIAGNOSIS — E7800 Pure hypercholesterolemia, unspecified: Secondary | ICD-10-CM | POA: Diagnosis not present

## 2021-11-14 DIAGNOSIS — M810 Age-related osteoporosis without current pathological fracture: Secondary | ICD-10-CM | POA: Diagnosis not present

## 2021-11-14 DIAGNOSIS — I10 Essential (primary) hypertension: Secondary | ICD-10-CM | POA: Diagnosis not present

## 2021-11-26 DIAGNOSIS — Z23 Encounter for immunization: Secondary | ICD-10-CM | POA: Diagnosis not present

## 2021-12-06 DIAGNOSIS — R109 Unspecified abdominal pain: Secondary | ICD-10-CM | POA: Diagnosis not present

## 2021-12-10 ENCOUNTER — Ambulatory Visit
Admission: RE | Admit: 2021-12-10 | Discharge: 2021-12-10 | Disposition: A | Discharge status: Home / Self Care | Payer: PPO | Source: Ambulatory Visit | Attending: Family Medicine | Admitting: Family Medicine

## 2021-12-10 ENCOUNTER — Other Ambulatory Visit: Payer: Self-pay | Admitting: Family Medicine

## 2021-12-10 DIAGNOSIS — I878 Other specified disorders of veins: Secondary | ICD-10-CM | POA: Diagnosis not present

## 2021-12-10 DIAGNOSIS — M25551 Pain in right hip: Secondary | ICD-10-CM

## 2021-12-10 DIAGNOSIS — M1611 Unilateral primary osteoarthritis, right hip: Secondary | ICD-10-CM | POA: Diagnosis not present

## 2021-12-10 DIAGNOSIS — M810 Age-related osteoporosis without current pathological fracture: Secondary | ICD-10-CM | POA: Diagnosis not present

## 2021-12-14 DIAGNOSIS — M545 Low back pain, unspecified: Secondary | ICD-10-CM | POA: Diagnosis not present

## 2021-12-14 DIAGNOSIS — M4185 Other forms of scoliosis, thoracolumbar region: Secondary | ICD-10-CM | POA: Diagnosis not present

## 2021-12-14 DIAGNOSIS — M47816 Spondylosis without myelopathy or radiculopathy, lumbar region: Secondary | ICD-10-CM | POA: Diagnosis not present

## 2021-12-14 DIAGNOSIS — M533 Sacrococcygeal disorders, not elsewhere classified: Secondary | ICD-10-CM | POA: Diagnosis not present

## 2021-12-18 DIAGNOSIS — M533 Sacrococcygeal disorders, not elsewhere classified: Secondary | ICD-10-CM | POA: Diagnosis not present

## 2022-01-01 ENCOUNTER — Other Ambulatory Visit: Payer: Self-pay | Admitting: Anesthesiology

## 2022-01-01 DIAGNOSIS — M5416 Radiculopathy, lumbar region: Secondary | ICD-10-CM

## 2022-01-01 DIAGNOSIS — M479 Spondylosis, unspecified: Secondary | ICD-10-CM | POA: Diagnosis not present

## 2022-01-01 DIAGNOSIS — M533 Sacrococcygeal disorders, not elsewhere classified: Secondary | ICD-10-CM | POA: Diagnosis not present

## 2022-01-06 ENCOUNTER — Ambulatory Visit
Admission: RE | Admit: 2022-01-06 | Discharge: 2022-01-06 | Disposition: A | Discharge status: Home / Self Care | Payer: PPO | Source: Ambulatory Visit | Attending: Anesthesiology | Admitting: Anesthesiology

## 2022-01-06 DIAGNOSIS — M5416 Radiculopathy, lumbar region: Secondary | ICD-10-CM

## 2022-01-06 DIAGNOSIS — M545 Low back pain, unspecified: Secondary | ICD-10-CM | POA: Diagnosis not present

## 2022-01-06 DIAGNOSIS — M48061 Spinal stenosis, lumbar region without neurogenic claudication: Secondary | ICD-10-CM | POA: Diagnosis not present

## 2022-01-08 DIAGNOSIS — M5451 Vertebrogenic low back pain: Secondary | ICD-10-CM | POA: Diagnosis not present

## 2022-01-23 DIAGNOSIS — I10 Essential (primary) hypertension: Secondary | ICD-10-CM | POA: Diagnosis not present

## 2022-01-23 DIAGNOSIS — D509 Iron deficiency anemia, unspecified: Secondary | ICD-10-CM | POA: Diagnosis not present

## 2022-01-23 DIAGNOSIS — D692 Other nonthrombocytopenic purpura: Secondary | ICD-10-CM | POA: Diagnosis not present

## 2022-01-23 DIAGNOSIS — R7303 Prediabetes: Secondary | ICD-10-CM | POA: Diagnosis not present

## 2022-01-23 DIAGNOSIS — D696 Thrombocytopenia, unspecified: Secondary | ICD-10-CM | POA: Diagnosis not present

## 2022-01-23 DIAGNOSIS — E039 Hypothyroidism, unspecified: Secondary | ICD-10-CM | POA: Diagnosis not present

## 2022-01-23 DIAGNOSIS — R188 Other ascites: Secondary | ICD-10-CM | POA: Diagnosis not present

## 2022-01-23 DIAGNOSIS — E46 Unspecified protein-calorie malnutrition: Secondary | ICD-10-CM | POA: Diagnosis not present

## 2022-01-23 DIAGNOSIS — F419 Anxiety disorder, unspecified: Secondary | ICD-10-CM | POA: Diagnosis not present

## 2022-01-23 DIAGNOSIS — K7460 Unspecified cirrhosis of liver: Secondary | ICD-10-CM | POA: Diagnosis not present

## 2022-01-23 DIAGNOSIS — E7800 Pure hypercholesterolemia, unspecified: Secondary | ICD-10-CM | POA: Diagnosis not present

## 2022-01-23 DIAGNOSIS — F330 Major depressive disorder, recurrent, mild: Secondary | ICD-10-CM | POA: Diagnosis not present

## 2022-01-29 DIAGNOSIS — M5451 Vertebrogenic low back pain: Secondary | ICD-10-CM | POA: Diagnosis not present

## 2022-02-13 DIAGNOSIS — R2689 Other abnormalities of gait and mobility: Secondary | ICD-10-CM | POA: Diagnosis not present

## 2022-02-13 DIAGNOSIS — M5451 Vertebrogenic low back pain: Secondary | ICD-10-CM | POA: Diagnosis not present

## 2022-02-13 DIAGNOSIS — M6281 Muscle weakness (generalized): Secondary | ICD-10-CM | POA: Diagnosis not present

## 2022-02-18 DIAGNOSIS — M5451 Vertebrogenic low back pain: Secondary | ICD-10-CM | POA: Diagnosis not present

## 2022-02-18 DIAGNOSIS — R2689 Other abnormalities of gait and mobility: Secondary | ICD-10-CM | POA: Diagnosis not present

## 2022-02-18 DIAGNOSIS — M6281 Muscle weakness (generalized): Secondary | ICD-10-CM | POA: Diagnosis not present

## 2022-02-20 DIAGNOSIS — M6281 Muscle weakness (generalized): Secondary | ICD-10-CM | POA: Diagnosis not present

## 2022-02-20 DIAGNOSIS — R2689 Other abnormalities of gait and mobility: Secondary | ICD-10-CM | POA: Diagnosis not present

## 2022-02-20 DIAGNOSIS — M5451 Vertebrogenic low back pain: Secondary | ICD-10-CM | POA: Diagnosis not present

## 2022-02-28 DIAGNOSIS — M810 Age-related osteoporosis without current pathological fracture: Secondary | ICD-10-CM | POA: Diagnosis not present

## 2022-03-01 DIAGNOSIS — M5451 Vertebrogenic low back pain: Secondary | ICD-10-CM | POA: Diagnosis not present

## 2022-03-01 DIAGNOSIS — R2689 Other abnormalities of gait and mobility: Secondary | ICD-10-CM | POA: Diagnosis not present

## 2022-03-01 DIAGNOSIS — M6281 Muscle weakness (generalized): Secondary | ICD-10-CM | POA: Diagnosis not present

## 2022-03-06 DIAGNOSIS — M6281 Muscle weakness (generalized): Secondary | ICD-10-CM | POA: Diagnosis not present

## 2022-03-06 DIAGNOSIS — M5451 Vertebrogenic low back pain: Secondary | ICD-10-CM | POA: Diagnosis not present

## 2022-03-06 DIAGNOSIS — R2689 Other abnormalities of gait and mobility: Secondary | ICD-10-CM | POA: Diagnosis not present

## 2022-03-13 DIAGNOSIS — M5451 Vertebrogenic low back pain: Secondary | ICD-10-CM | POA: Diagnosis not present

## 2022-03-13 DIAGNOSIS — M6281 Muscle weakness (generalized): Secondary | ICD-10-CM | POA: Diagnosis not present

## 2022-03-13 DIAGNOSIS — R2689 Other abnormalities of gait and mobility: Secondary | ICD-10-CM | POA: Diagnosis not present

## 2022-03-15 DIAGNOSIS — R2689 Other abnormalities of gait and mobility: Secondary | ICD-10-CM | POA: Diagnosis not present

## 2022-03-15 DIAGNOSIS — M5451 Vertebrogenic low back pain: Secondary | ICD-10-CM | POA: Diagnosis not present

## 2022-03-15 DIAGNOSIS — M6281 Muscle weakness (generalized): Secondary | ICD-10-CM | POA: Diagnosis not present

## 2022-03-18 DIAGNOSIS — M5451 Vertebrogenic low back pain: Secondary | ICD-10-CM | POA: Diagnosis not present

## 2022-03-18 DIAGNOSIS — M6281 Muscle weakness (generalized): Secondary | ICD-10-CM | POA: Diagnosis not present

## 2022-03-18 DIAGNOSIS — R2689 Other abnormalities of gait and mobility: Secondary | ICD-10-CM | POA: Diagnosis not present

## 2022-03-22 DIAGNOSIS — M6281 Muscle weakness (generalized): Secondary | ICD-10-CM | POA: Diagnosis not present

## 2022-03-22 DIAGNOSIS — R2689 Other abnormalities of gait and mobility: Secondary | ICD-10-CM | POA: Diagnosis not present

## 2022-03-22 DIAGNOSIS — M5451 Vertebrogenic low back pain: Secondary | ICD-10-CM | POA: Diagnosis not present

## 2022-03-27 DIAGNOSIS — Z961 Presence of intraocular lens: Secondary | ICD-10-CM | POA: Diagnosis not present

## 2022-03-27 DIAGNOSIS — H43813 Vitreous degeneration, bilateral: Secondary | ICD-10-CM | POA: Diagnosis not present

## 2022-03-27 DIAGNOSIS — H52203 Unspecified astigmatism, bilateral: Secondary | ICD-10-CM | POA: Diagnosis not present

## 2022-03-29 DIAGNOSIS — M6281 Muscle weakness (generalized): Secondary | ICD-10-CM | POA: Diagnosis not present

## 2022-03-29 DIAGNOSIS — R2689 Other abnormalities of gait and mobility: Secondary | ICD-10-CM | POA: Diagnosis not present

## 2022-03-29 DIAGNOSIS — M5451 Vertebrogenic low back pain: Secondary | ICD-10-CM | POA: Diagnosis not present

## 2022-04-08 DIAGNOSIS — M5451 Vertebrogenic low back pain: Secondary | ICD-10-CM | POA: Diagnosis not present

## 2022-04-08 DIAGNOSIS — M6281 Muscle weakness (generalized): Secondary | ICD-10-CM | POA: Diagnosis not present

## 2022-04-08 DIAGNOSIS — R2689 Other abnormalities of gait and mobility: Secondary | ICD-10-CM | POA: Diagnosis not present

## 2022-04-10 DIAGNOSIS — R2689 Other abnormalities of gait and mobility: Secondary | ICD-10-CM | POA: Diagnosis not present

## 2022-04-10 DIAGNOSIS — M5451 Vertebrogenic low back pain: Secondary | ICD-10-CM | POA: Diagnosis not present

## 2022-04-10 DIAGNOSIS — M6281 Muscle weakness (generalized): Secondary | ICD-10-CM | POA: Diagnosis not present

## 2022-07-17 DIAGNOSIS — E039 Hypothyroidism, unspecified: Secondary | ICD-10-CM | POA: Diagnosis not present

## 2022-07-17 DIAGNOSIS — R188 Other ascites: Secondary | ICD-10-CM | POA: Diagnosis not present

## 2022-07-17 DIAGNOSIS — M810 Age-related osteoporosis without current pathological fracture: Secondary | ICD-10-CM | POA: Diagnosis not present

## 2022-07-17 DIAGNOSIS — I8510 Secondary esophageal varices without bleeding: Secondary | ICD-10-CM | POA: Diagnosis not present

## 2022-07-17 DIAGNOSIS — K7469 Other cirrhosis of liver: Secondary | ICD-10-CM | POA: Diagnosis not present

## 2022-07-17 DIAGNOSIS — E7800 Pure hypercholesterolemia, unspecified: Secondary | ICD-10-CM | POA: Diagnosis not present

## 2022-07-17 DIAGNOSIS — D509 Iron deficiency anemia, unspecified: Secondary | ICD-10-CM | POA: Diagnosis not present

## 2022-08-12 DIAGNOSIS — F330 Major depressive disorder, recurrent, mild: Secondary | ICD-10-CM | POA: Diagnosis not present

## 2022-08-12 DIAGNOSIS — I10 Essential (primary) hypertension: Secondary | ICD-10-CM | POA: Diagnosis not present

## 2022-08-12 DIAGNOSIS — M810 Age-related osteoporosis without current pathological fracture: Secondary | ICD-10-CM | POA: Diagnosis not present

## 2022-08-12 DIAGNOSIS — D509 Iron deficiency anemia, unspecified: Secondary | ICD-10-CM | POA: Diagnosis not present

## 2022-08-12 DIAGNOSIS — I700 Atherosclerosis of aorta: Secondary | ICD-10-CM | POA: Diagnosis not present

## 2022-08-12 DIAGNOSIS — Z23 Encounter for immunization: Secondary | ICD-10-CM | POA: Diagnosis not present

## 2022-08-12 DIAGNOSIS — K7460 Unspecified cirrhosis of liver: Secondary | ICD-10-CM | POA: Diagnosis not present

## 2022-08-12 DIAGNOSIS — E039 Hypothyroidism, unspecified: Secondary | ICD-10-CM | POA: Diagnosis not present

## 2022-08-12 DIAGNOSIS — D696 Thrombocytopenia, unspecified: Secondary | ICD-10-CM | POA: Diagnosis not present

## 2022-08-12 DIAGNOSIS — R7303 Prediabetes: Secondary | ICD-10-CM | POA: Diagnosis not present

## 2022-08-12 DIAGNOSIS — Z0000 Encounter for general adult medical examination without abnormal findings: Secondary | ICD-10-CM | POA: Diagnosis not present

## 2022-08-12 DIAGNOSIS — E7800 Pure hypercholesterolemia, unspecified: Secondary | ICD-10-CM | POA: Diagnosis not present

## 2022-08-13 DIAGNOSIS — M5451 Vertebrogenic low back pain: Secondary | ICD-10-CM | POA: Diagnosis not present

## 2022-08-20 DIAGNOSIS — M6281 Muscle weakness (generalized): Secondary | ICD-10-CM | POA: Diagnosis not present

## 2022-08-20 DIAGNOSIS — M5459 Other low back pain: Secondary | ICD-10-CM | POA: Diagnosis not present

## 2022-08-20 DIAGNOSIS — M25552 Pain in left hip: Secondary | ICD-10-CM | POA: Diagnosis not present

## 2022-08-20 DIAGNOSIS — M5451 Vertebrogenic low back pain: Secondary | ICD-10-CM | POA: Diagnosis not present

## 2022-08-21 DIAGNOSIS — Z13820 Encounter for screening for osteoporosis: Secondary | ICD-10-CM | POA: Diagnosis not present

## 2022-08-21 DIAGNOSIS — Z853 Personal history of malignant neoplasm of breast: Secondary | ICD-10-CM | POA: Diagnosis not present

## 2022-08-21 DIAGNOSIS — M419 Scoliosis, unspecified: Secondary | ICD-10-CM | POA: Diagnosis not present

## 2022-08-22 DIAGNOSIS — M25552 Pain in left hip: Secondary | ICD-10-CM | POA: Diagnosis not present

## 2022-08-22 DIAGNOSIS — M5451 Vertebrogenic low back pain: Secondary | ICD-10-CM | POA: Diagnosis not present

## 2022-08-22 DIAGNOSIS — M6281 Muscle weakness (generalized): Secondary | ICD-10-CM | POA: Diagnosis not present

## 2022-08-22 DIAGNOSIS — M5459 Other low back pain: Secondary | ICD-10-CM | POA: Diagnosis not present

## 2022-08-27 DIAGNOSIS — M5451 Vertebrogenic low back pain: Secondary | ICD-10-CM | POA: Diagnosis not present

## 2022-08-27 DIAGNOSIS — M5459 Other low back pain: Secondary | ICD-10-CM | POA: Diagnosis not present

## 2022-08-27 DIAGNOSIS — M25552 Pain in left hip: Secondary | ICD-10-CM | POA: Diagnosis not present

## 2022-08-27 DIAGNOSIS — M6281 Muscle weakness (generalized): Secondary | ICD-10-CM | POA: Diagnosis not present

## 2022-08-29 DIAGNOSIS — R944 Abnormal results of kidney function studies: Secondary | ICD-10-CM | POA: Diagnosis not present

## 2022-09-03 DIAGNOSIS — M6281 Muscle weakness (generalized): Secondary | ICD-10-CM | POA: Diagnosis not present

## 2022-09-03 DIAGNOSIS — M5451 Vertebrogenic low back pain: Secondary | ICD-10-CM | POA: Diagnosis not present

## 2022-09-03 DIAGNOSIS — M5459 Other low back pain: Secondary | ICD-10-CM | POA: Diagnosis not present

## 2022-09-03 DIAGNOSIS — M25552 Pain in left hip: Secondary | ICD-10-CM | POA: Diagnosis not present

## 2022-09-16 DIAGNOSIS — M810 Age-related osteoporosis without current pathological fracture: Secondary | ICD-10-CM | POA: Diagnosis not present

## 2022-09-19 DIAGNOSIS — M5459 Other low back pain: Secondary | ICD-10-CM | POA: Diagnosis not present

## 2022-09-19 DIAGNOSIS — M25552 Pain in left hip: Secondary | ICD-10-CM | POA: Diagnosis not present

## 2022-09-19 DIAGNOSIS — M5451 Vertebrogenic low back pain: Secondary | ICD-10-CM | POA: Diagnosis not present

## 2022-09-19 DIAGNOSIS — M6281 Muscle weakness (generalized): Secondary | ICD-10-CM | POA: Diagnosis not present

## 2022-09-26 DIAGNOSIS — M6281 Muscle weakness (generalized): Secondary | ICD-10-CM | POA: Diagnosis not present

## 2022-09-26 DIAGNOSIS — M25552 Pain in left hip: Secondary | ICD-10-CM | POA: Diagnosis not present

## 2022-09-26 DIAGNOSIS — M5451 Vertebrogenic low back pain: Secondary | ICD-10-CM | POA: Diagnosis not present

## 2022-09-26 DIAGNOSIS — M5459 Other low back pain: Secondary | ICD-10-CM | POA: Diagnosis not present

## 2022-10-01 DIAGNOSIS — M6281 Muscle weakness (generalized): Secondary | ICD-10-CM | POA: Diagnosis not present

## 2022-10-01 DIAGNOSIS — M25552 Pain in left hip: Secondary | ICD-10-CM | POA: Diagnosis not present

## 2022-10-01 DIAGNOSIS — M5459 Other low back pain: Secondary | ICD-10-CM | POA: Diagnosis not present

## 2022-10-01 DIAGNOSIS — M5451 Vertebrogenic low back pain: Secondary | ICD-10-CM | POA: Diagnosis not present

## 2022-10-08 DIAGNOSIS — M5451 Vertebrogenic low back pain: Secondary | ICD-10-CM | POA: Diagnosis not present

## 2022-10-08 DIAGNOSIS — M5459 Other low back pain: Secondary | ICD-10-CM | POA: Diagnosis not present

## 2022-10-08 DIAGNOSIS — M25552 Pain in left hip: Secondary | ICD-10-CM | POA: Diagnosis not present

## 2022-10-08 DIAGNOSIS — M6281 Muscle weakness (generalized): Secondary | ICD-10-CM | POA: Diagnosis not present

## 2022-10-10 DIAGNOSIS — M5451 Vertebrogenic low back pain: Secondary | ICD-10-CM | POA: Diagnosis not present

## 2022-10-10 DIAGNOSIS — M25552 Pain in left hip: Secondary | ICD-10-CM | POA: Diagnosis not present

## 2022-10-10 DIAGNOSIS — M6281 Muscle weakness (generalized): Secondary | ICD-10-CM | POA: Diagnosis not present

## 2022-10-10 DIAGNOSIS — M5459 Other low back pain: Secondary | ICD-10-CM | POA: Diagnosis not present

## 2022-10-15 DIAGNOSIS — M6281 Muscle weakness (generalized): Secondary | ICD-10-CM | POA: Diagnosis not present

## 2022-10-15 DIAGNOSIS — M5451 Vertebrogenic low back pain: Secondary | ICD-10-CM | POA: Diagnosis not present

## 2022-10-15 DIAGNOSIS — M5459 Other low back pain: Secondary | ICD-10-CM | POA: Diagnosis not present

## 2022-10-15 DIAGNOSIS — M25552 Pain in left hip: Secondary | ICD-10-CM | POA: Diagnosis not present

## 2022-10-17 DIAGNOSIS — M6281 Muscle weakness (generalized): Secondary | ICD-10-CM | POA: Diagnosis not present

## 2022-10-17 DIAGNOSIS — M5451 Vertebrogenic low back pain: Secondary | ICD-10-CM | POA: Diagnosis not present

## 2022-10-17 DIAGNOSIS — M5459 Other low back pain: Secondary | ICD-10-CM | POA: Diagnosis not present

## 2022-10-17 DIAGNOSIS — M25552 Pain in left hip: Secondary | ICD-10-CM | POA: Diagnosis not present

## 2022-10-24 DIAGNOSIS — M6281 Muscle weakness (generalized): Secondary | ICD-10-CM | POA: Diagnosis not present

## 2022-10-24 DIAGNOSIS — M5451 Vertebrogenic low back pain: Secondary | ICD-10-CM | POA: Diagnosis not present

## 2022-10-24 DIAGNOSIS — M25552 Pain in left hip: Secondary | ICD-10-CM | POA: Diagnosis not present

## 2022-10-24 DIAGNOSIS — M5459 Other low back pain: Secondary | ICD-10-CM | POA: Diagnosis not present

## 2022-10-25 DIAGNOSIS — L218 Other seborrheic dermatitis: Secondary | ICD-10-CM | POA: Diagnosis not present

## 2022-10-25 DIAGNOSIS — T148XXA Other injury of unspecified body region, initial encounter: Secondary | ICD-10-CM | POA: Diagnosis not present

## 2022-10-25 DIAGNOSIS — C4441 Basal cell carcinoma of skin of scalp and neck: Secondary | ICD-10-CM | POA: Diagnosis not present

## 2022-11-07 DIAGNOSIS — M5451 Vertebrogenic low back pain: Secondary | ICD-10-CM | POA: Diagnosis not present

## 2022-11-07 DIAGNOSIS — M5459 Other low back pain: Secondary | ICD-10-CM | POA: Diagnosis not present

## 2022-11-07 DIAGNOSIS — M25552 Pain in left hip: Secondary | ICD-10-CM | POA: Diagnosis not present

## 2022-11-07 DIAGNOSIS — M6281 Muscle weakness (generalized): Secondary | ICD-10-CM | POA: Diagnosis not present

## 2022-11-15 DIAGNOSIS — M5451 Vertebrogenic low back pain: Secondary | ICD-10-CM | POA: Diagnosis not present

## 2022-11-15 DIAGNOSIS — M5459 Other low back pain: Secondary | ICD-10-CM | POA: Diagnosis not present

## 2022-11-15 DIAGNOSIS — M6281 Muscle weakness (generalized): Secondary | ICD-10-CM | POA: Diagnosis not present

## 2022-11-15 DIAGNOSIS — M25552 Pain in left hip: Secondary | ICD-10-CM | POA: Diagnosis not present

## 2022-11-21 DIAGNOSIS — M25552 Pain in left hip: Secondary | ICD-10-CM | POA: Diagnosis not present

## 2022-11-21 DIAGNOSIS — M5459 Other low back pain: Secondary | ICD-10-CM | POA: Diagnosis not present

## 2022-11-21 DIAGNOSIS — M6281 Muscle weakness (generalized): Secondary | ICD-10-CM | POA: Diagnosis not present

## 2022-11-21 DIAGNOSIS — M5451 Vertebrogenic low back pain: Secondary | ICD-10-CM | POA: Diagnosis not present

## 2022-11-27 DIAGNOSIS — M5451 Vertebrogenic low back pain: Secondary | ICD-10-CM | POA: Diagnosis not present

## 2022-11-27 DIAGNOSIS — M6281 Muscle weakness (generalized): Secondary | ICD-10-CM | POA: Diagnosis not present

## 2022-11-27 DIAGNOSIS — M25552 Pain in left hip: Secondary | ICD-10-CM | POA: Diagnosis not present

## 2022-11-27 DIAGNOSIS — M5459 Other low back pain: Secondary | ICD-10-CM | POA: Diagnosis not present

## 2022-12-11 DIAGNOSIS — M6281 Muscle weakness (generalized): Secondary | ICD-10-CM | POA: Diagnosis not present

## 2022-12-11 DIAGNOSIS — M25552 Pain in left hip: Secondary | ICD-10-CM | POA: Diagnosis not present

## 2022-12-11 DIAGNOSIS — M5459 Other low back pain: Secondary | ICD-10-CM | POA: Diagnosis not present

## 2022-12-11 DIAGNOSIS — M5451 Vertebrogenic low back pain: Secondary | ICD-10-CM | POA: Diagnosis not present

## 2022-12-23 DIAGNOSIS — N183 Chronic kidney disease, stage 3 unspecified: Secondary | ICD-10-CM | POA: Diagnosis not present

## 2022-12-23 DIAGNOSIS — E039 Hypothyroidism, unspecified: Secondary | ICD-10-CM | POA: Diagnosis not present

## 2022-12-23 DIAGNOSIS — Z853 Personal history of malignant neoplasm of breast: Secondary | ICD-10-CM | POA: Diagnosis not present

## 2022-12-23 DIAGNOSIS — M810 Age-related osteoporosis without current pathological fracture: Secondary | ICD-10-CM | POA: Diagnosis not present

## 2022-12-23 DIAGNOSIS — I10 Essential (primary) hypertension: Secondary | ICD-10-CM | POA: Diagnosis not present

## 2023-01-03 DIAGNOSIS — Z85828 Personal history of other malignant neoplasm of skin: Secondary | ICD-10-CM | POA: Diagnosis not present

## 2023-01-03 DIAGNOSIS — Z08 Encounter for follow-up examination after completed treatment for malignant neoplasm: Secondary | ICD-10-CM | POA: Diagnosis not present

## 2023-01-03 DIAGNOSIS — L821 Other seborrheic keratosis: Secondary | ICD-10-CM | POA: Diagnosis not present

## 2023-02-07 DIAGNOSIS — J0100 Acute maxillary sinusitis, unspecified: Secondary | ICD-10-CM | POA: Diagnosis not present

## 2023-02-11 DIAGNOSIS — D696 Thrombocytopenia, unspecified: Secondary | ICD-10-CM | POA: Diagnosis not present

## 2023-02-11 DIAGNOSIS — J0100 Acute maxillary sinusitis, unspecified: Secondary | ICD-10-CM | POA: Diagnosis not present

## 2023-02-11 DIAGNOSIS — I10 Essential (primary) hypertension: Secondary | ICD-10-CM | POA: Diagnosis not present

## 2023-02-11 DIAGNOSIS — M810 Age-related osteoporosis without current pathological fracture: Secondary | ICD-10-CM | POA: Diagnosis not present

## 2023-02-11 DIAGNOSIS — F330 Major depressive disorder, recurrent, mild: Secondary | ICD-10-CM | POA: Diagnosis not present

## 2023-02-11 DIAGNOSIS — Z853 Personal history of malignant neoplasm of breast: Secondary | ICD-10-CM | POA: Diagnosis not present

## 2023-02-11 DIAGNOSIS — D509 Iron deficiency anemia, unspecified: Secondary | ICD-10-CM | POA: Diagnosis not present

## 2023-02-11 DIAGNOSIS — E039 Hypothyroidism, unspecified: Secondary | ICD-10-CM | POA: Diagnosis not present

## 2023-02-11 DIAGNOSIS — K7460 Unspecified cirrhosis of liver: Secondary | ICD-10-CM | POA: Diagnosis not present

## 2023-02-11 DIAGNOSIS — I700 Atherosclerosis of aorta: Secondary | ICD-10-CM | POA: Diagnosis not present

## 2023-02-11 DIAGNOSIS — R7303 Prediabetes: Secondary | ICD-10-CM | POA: Diagnosis not present

## 2023-02-11 DIAGNOSIS — E7800 Pure hypercholesterolemia, unspecified: Secondary | ICD-10-CM | POA: Diagnosis not present

## 2023-05-20 DIAGNOSIS — S51019A Laceration without foreign body of unspecified elbow, initial encounter: Secondary | ICD-10-CM | POA: Diagnosis not present

## 2023-06-09 DIAGNOSIS — K644 Residual hemorrhoidal skin tags: Secondary | ICD-10-CM | POA: Diagnosis not present

## 2023-07-16 DIAGNOSIS — K7469 Other cirrhosis of liver: Secondary | ICD-10-CM | POA: Diagnosis not present

## 2023-07-16 DIAGNOSIS — R188 Other ascites: Secondary | ICD-10-CM | POA: Diagnosis not present

## 2023-08-01 DIAGNOSIS — Z08 Encounter for follow-up examination after completed treatment for malignant neoplasm: Secondary | ICD-10-CM | POA: Diagnosis not present

## 2023-08-01 DIAGNOSIS — L821 Other seborrheic keratosis: Secondary | ICD-10-CM | POA: Diagnosis not present

## 2023-08-01 DIAGNOSIS — Z85828 Personal history of other malignant neoplasm of skin: Secondary | ICD-10-CM | POA: Diagnosis not present

## 2023-08-01 DIAGNOSIS — D225 Melanocytic nevi of trunk: Secondary | ICD-10-CM | POA: Diagnosis not present

## 2023-08-04 DIAGNOSIS — I2510 Atherosclerotic heart disease of native coronary artery without angina pectoris: Secondary | ICD-10-CM | POA: Diagnosis not present

## 2023-08-04 DIAGNOSIS — R7303 Prediabetes: Secondary | ICD-10-CM | POA: Diagnosis not present

## 2023-08-04 DIAGNOSIS — D509 Iron deficiency anemia, unspecified: Secondary | ICD-10-CM | POA: Diagnosis not present

## 2023-08-04 DIAGNOSIS — N183 Chronic kidney disease, stage 3 unspecified: Secondary | ICD-10-CM | POA: Diagnosis not present

## 2023-08-04 DIAGNOSIS — M810 Age-related osteoporosis without current pathological fracture: Secondary | ICD-10-CM | POA: Diagnosis not present

## 2023-08-04 DIAGNOSIS — E871 Hypo-osmolality and hyponatremia: Secondary | ICD-10-CM | POA: Diagnosis not present

## 2023-08-04 DIAGNOSIS — K7460 Unspecified cirrhosis of liver: Secondary | ICD-10-CM | POA: Diagnosis not present

## 2023-08-04 DIAGNOSIS — E039 Hypothyroidism, unspecified: Secondary | ICD-10-CM | POA: Diagnosis not present

## 2023-08-04 DIAGNOSIS — I10 Essential (primary) hypertension: Secondary | ICD-10-CM | POA: Diagnosis not present

## 2023-09-25 DIAGNOSIS — K7460 Unspecified cirrhosis of liver: Secondary | ICD-10-CM | POA: Diagnosis not present

## 2023-09-25 DIAGNOSIS — E039 Hypothyroidism, unspecified: Secondary | ICD-10-CM | POA: Diagnosis not present

## 2023-09-25 DIAGNOSIS — R188 Other ascites: Secondary | ICD-10-CM | POA: Diagnosis not present

## 2023-09-25 DIAGNOSIS — Z853 Personal history of malignant neoplasm of breast: Secondary | ICD-10-CM | POA: Diagnosis not present

## 2023-09-25 DIAGNOSIS — F330 Major depressive disorder, recurrent, mild: Secondary | ICD-10-CM | POA: Diagnosis not present

## 2023-09-25 DIAGNOSIS — I10 Essential (primary) hypertension: Secondary | ICD-10-CM | POA: Diagnosis not present

## 2023-09-25 DIAGNOSIS — M810 Age-related osteoporosis without current pathological fracture: Secondary | ICD-10-CM | POA: Diagnosis not present

## 2023-09-25 DIAGNOSIS — R7303 Prediabetes: Secondary | ICD-10-CM | POA: Diagnosis not present

## 2023-09-25 DIAGNOSIS — D696 Thrombocytopenia, unspecified: Secondary | ICD-10-CM | POA: Diagnosis not present

## 2023-09-25 DIAGNOSIS — D509 Iron deficiency anemia, unspecified: Secondary | ICD-10-CM | POA: Diagnosis not present

## 2023-09-25 DIAGNOSIS — E7800 Pure hypercholesterolemia, unspecified: Secondary | ICD-10-CM | POA: Diagnosis not present

## 2023-09-25 DIAGNOSIS — Z0000 Encounter for general adult medical examination without abnormal findings: Secondary | ICD-10-CM | POA: Diagnosis not present

## 2023-10-06 DIAGNOSIS — M791 Myalgia, unspecified site: Secondary | ICD-10-CM | POA: Diagnosis not present

## 2023-10-06 DIAGNOSIS — M545 Low back pain, unspecified: Secondary | ICD-10-CM | POA: Diagnosis not present

## 2023-10-06 DIAGNOSIS — M7062 Trochanteric bursitis, left hip: Secondary | ICD-10-CM | POA: Diagnosis not present

## 2023-10-29 ENCOUNTER — Emergency Department (HOSPITAL_COMMUNITY)

## 2023-10-29 ENCOUNTER — Emergency Department (HOSPITAL_COMMUNITY)
Admission: EM | Admit: 2023-10-29 | Discharge: 2023-10-29 | Disposition: A | Discharge status: Home / Self Care | Attending: Emergency Medicine | Admitting: Emergency Medicine

## 2023-10-29 ENCOUNTER — Other Ambulatory Visit: Payer: Self-pay

## 2023-10-29 DIAGNOSIS — S41011A Laceration without foreign body of right shoulder, initial encounter: Secondary | ICD-10-CM | POA: Diagnosis not present

## 2023-10-29 DIAGNOSIS — W19XXXA Unspecified fall, initial encounter: Secondary | ICD-10-CM

## 2023-10-29 DIAGNOSIS — S0993XA Unspecified injury of face, initial encounter: Secondary | ICD-10-CM | POA: Diagnosis not present

## 2023-10-29 DIAGNOSIS — R609 Edema, unspecified: Secondary | ICD-10-CM | POA: Diagnosis not present

## 2023-10-29 DIAGNOSIS — Z043 Encounter for examination and observation following other accident: Secondary | ICD-10-CM | POA: Diagnosis not present

## 2023-10-29 DIAGNOSIS — Z853 Personal history of malignant neoplasm of breast: Secondary | ICD-10-CM | POA: Insufficient documentation

## 2023-10-29 DIAGNOSIS — S025XXA Fracture of tooth (traumatic), initial encounter for closed fracture: Secondary | ICD-10-CM | POA: Diagnosis not present

## 2023-10-29 DIAGNOSIS — Y92000 Kitchen of unspecified non-institutional (private) residence as  the place of occurrence of the external cause: Secondary | ICD-10-CM | POA: Diagnosis not present

## 2023-10-29 DIAGNOSIS — I5030 Unspecified diastolic (congestive) heart failure: Secondary | ICD-10-CM | POA: Diagnosis not present

## 2023-10-29 DIAGNOSIS — S4991XA Unspecified injury of right shoulder and upper arm, initial encounter: Secondary | ICD-10-CM | POA: Diagnosis not present

## 2023-10-29 DIAGNOSIS — M4802 Spinal stenosis, cervical region: Secondary | ICD-10-CM | POA: Diagnosis not present

## 2023-10-29 DIAGNOSIS — M778 Other enthesopathies, not elsewhere classified: Secondary | ICD-10-CM | POA: Diagnosis not present

## 2023-10-29 DIAGNOSIS — M4312 Spondylolisthesis, cervical region: Secondary | ICD-10-CM | POA: Diagnosis not present

## 2023-10-29 DIAGNOSIS — S0990XA Unspecified injury of head, initial encounter: Secondary | ICD-10-CM

## 2023-10-29 DIAGNOSIS — S199XXA Unspecified injury of neck, initial encounter: Secondary | ICD-10-CM | POA: Diagnosis not present

## 2023-10-29 DIAGNOSIS — W01198A Fall on same level from slipping, tripping and stumbling with subsequent striking against other object, initial encounter: Secondary | ICD-10-CM | POA: Diagnosis not present

## 2023-10-29 DIAGNOSIS — S51011A Laceration without foreign body of right elbow, initial encounter: Secondary | ICD-10-CM | POA: Insufficient documentation

## 2023-10-29 DIAGNOSIS — S00502A Unspecified superficial injury of oral cavity, initial encounter: Secondary | ICD-10-CM | POA: Diagnosis not present

## 2023-10-29 DIAGNOSIS — M5030 Other cervical disc degeneration, unspecified cervical region: Secondary | ICD-10-CM | POA: Diagnosis not present

## 2023-10-29 DIAGNOSIS — S01511A Laceration without foreign body of lip, initial encounter: Secondary | ICD-10-CM | POA: Diagnosis not present

## 2023-10-29 NOTE — ED Triage Notes (Signed)
**  HARD OF HEARING.   PT arrives via EMS from [facility]. PT suffered a mechanical fall when she tripped while hurrying to answer the phone. Pt has a tooth injury, lip injury, and RIGHT shoulder injury.   PT alert, and oriented.

## 2023-10-29 NOTE — ED Notes (Signed)
 Pt provided with water per request.

## 2023-10-29 NOTE — ED Notes (Signed)
 Daughter of pt has arrived. Discharge instructions reviewed with pt and daughter.

## 2023-10-29 NOTE — ED Provider Notes (Cosign Needed Addendum)
 [place] EMERGENCY DEPARTMENT AT [facility] Provider Note   CSN: [id number] Arrival date & time: 10/29/23  1202     Patient presents with: Fall and Facial Injury   Jenna [name] is a 89 y.o. female.   Patient with history of breast cancer, sepsis, diastolic heart failure -- presents to the emergency department by EMS today after a fall from her assisted living at [facility].  Patient was in her kitchen and tripped, falling onto her left side and hitting her face on the kitchen floor.  She did not lose consciousness.  Denies anticoagulation.  Patient sustained a laceration to her upper lip, broken upper incisor, injury to the other upper incisor, skin tear to the right shoulder.  Patient denies headache, vomiting, confusion.  She is moving her extremities normally.  No hip pain.  No chest or abdominal pain.  The missing part of the incisor was not identified on scene.  Patient has not had any choking, coughing, difficulty breathing.       Prior to Admission medications   Medication Sig Start Date End Date Taking? Authorizing Provider  acetaminophen  (TYLENOL ) 500 MG tablet Take 500 mg by mouth every 6 (six) hours as needed. Patient not taking: Reported on 07/20/2020    [provider]  atenolol (TENORMIN) 50 MG tablet Take 50 mg by mouth daily.     [provider]  cholecalciferol (VITAMIN D) 1000 UNITS tablet Take 1,000 Units by mouth daily.    [provider]  furosemide  (LASIX ) 20 MG tablet Take 20 mg by mouth.    [provider]  iron polysaccharides (NIFEREX) 150 MG capsule 1 capsule    [provider]  LORazepam  (ATIVAN ) 1 MG tablet Take 0.5-1 mg by mouth 2 (two) times daily as needed for anxiety or sleep.    [provider]  losartan (COZAAR) 100 MG tablet Take 100 mg by mouth daily. 08/24/15   [provider]  Misc Natural Products (LUTEIN 20 PO) Take 1 tablet by mouth daily. Reported on 06/22/2015     [provider]  rosuvastatin (CRESTOR) 5 MG tablet Take 5 mg by mouth 2 (two) times a week. 07/13/20   [provider]  senna (SENOKOT) 8.6 MG tablet Take 1-2 tablets by mouth at bedtime.     [provider]  sertraline  (ZOLOFT ) 100 MG tablet Take 100 mg by mouth daily.    [provider]    Allergies: Iodine, Lactose intolerance (gi), Naproxen, Wound dressing adhesive, and Sulfa antibiotics    Review of Systems  Updated Vital Signs There were no vitals taken for this visit.  Physical Exam Vitals and nursing note reviewed.  Constitutional:      Appearance: She is well-developed.  HENT:     Head: Normocephalic and atraumatic. No raccoon eyes or Battle's sign.     Right Ear: Tympanic membrane, ear canal and external ear normal. No hemotympanum.     Left Ear: Tympanic membrane, ear canal and external ear normal. No hemotympanum.     Nose: Nose normal.     Mouth/Throat:     Pharynx: Uvula midline.     Comments: There is a small superficial, minimally gaping laceration to the right upper lip, does not cross vermilion border.  Tooth #8 laterally luxated, minimal looseness.  Tooth #9 is broken.  Dried blood in mouth.  I do not identify other laceration or injury. Eyes:     General: Lids are normal.  Extraocular Movements:     Right eye: No nystagmus.     Left eye: No nystagmus.     Conjunctiva/sclera: Conjunctivae normal.     Pupils: Pupils are equal, round, and reactive to light.     Comments: No visible hyphema noted  Cardiovascular:     Rate and Rhythm: Normal rate and regular rhythm.  Pulmonary:     Effort: Pulmonary effort is normal.     Breath sounds: Normal breath sounds.  Abdominal:     Palpations: Abdomen is soft.     Tenderness: There is no abdominal tenderness.  Musculoskeletal:     Right shoulder: No tenderness. Normal range of motion.     Left shoulder: No tenderness. Normal range of motion.     Right elbow: Normal range of  motion. No tenderness.     Left elbow: Normal range of motion.       Arms:     Cervical back: Normal range of motion and neck supple. No tenderness or bony tenderness.     Thoracic back: No tenderness or bony tenderness.     Lumbar back: No tenderness or bony tenderness.     Right hip: No tenderness. Normal range of motion.     Left hip: No tenderness. Normal range of motion.     Comments: Skin tear over the lateral aspect of the right shoulder.  Skin:    General: Skin is warm and dry.  Neurological:     Mental Status: She is alert and oriented to person, place, and time.     GCS: GCS eye subscore is 4. GCS verbal subscore is 5. GCS motor subscore is 6.     Cranial Nerves: No cranial nerve deficit.     Sensory: No sensory deficit.     Coordination: Coordination normal.    ED Course  Patient seen and examined. History obtained directly from patient.   Labs/EKG: None ordered  Imaging: Ordered CT head, CT maxillofacial, CT cervical spine.  X-ray of the chest, x-ray of the right shoulder..  Medications/Fluids: None ordered  Most recent vital signs reviewed and are as follows: There were no vitals taken for this visit.  Initial impression: Mechanical fall, facial/dental injury, skin tear of left shoulder.  2:32 PM Reassessment performed. Patient appears stable.  Imaging personally visualized and interpreted including: CT head agree negative, CT C-spine agree no fracture.  CT maxillofacial shows broken incisor otherwise no facial fractures.  X-ray of the shoulder and chest, agree no fractures or dislocations.  Lungs appear normal.  Reviewed pertinent lab work and imaging with patient at bedside. Questions answered.   Most current vital signs reviewed and are as follows: BP 129/62 (BP Location: Left Arm)   Pulse 66   Temp 97.6 F (36.4 C) (Oral)   Resp 18   Wt 46 kg   SpO2 100%   BMI 20.48 kg/m   Plan: Discharge to home.   Prescriptions written for: None  Other home  care instructions discussed: Discussed wound care for lip laceration and right shoulder skin tear.  Discussed need for follow-up with dentist for dental injury.  Recommended soft diet.  No chewing or turning with incisors.  ED return instructions discussed: Patient was counseled on head injury precautions and symptoms that should indicate their return to the ED.  These include severe worsening headache, vision changes, confusion, loss of consciousness, trouble walking, nausea & vomiting, or weakness/tingling in extremities.    Follow-up instructions discussed: Patient encouraged to follow-up with their  dentist asap.   3:37 PM daughter here to bring the patient home.  Instructions given by RN.  I looked at her lip again, asked to by RN.  Wound is stable, no significant bleeding.  Advised continued wound care at home.  (all labs ordered are listed, but only abnormal results are displayed) Labs Reviewed - No data to display  EKG: None  Radiology: CT Cervical Spine Wo Contrast Result Date: 10/29/2023 CLINICAL DATA:  Neck trauma (Age >= 65y).  Fall. EXAM: CT CERVICAL SPINE WITHOUT CONTRAST TECHNIQUE: Multidetector CT imaging of the cervical spine was performed without intravenous contrast. Multiplanar CT image reconstructions were also generated. RADIATION DOSE REDUCTION: This exam was performed according to the departmental dose-optimization program which includes automated exposure control, adjustment of the mA and/or kV according to patient size and/or use of iterative reconstruction technique. COMPARISON:  None available FINDINGS: Alignment: 3 mm anterolisthesis of C2 on C3 related to degenerative facet disease. Skull base and vertebrae: No acute fracture. No primary bone lesion or focal pathologic process. Soft tissues and spinal canal: No prevertebral fluid or swelling. No visible canal hematoma. Disc levels: Diffuse degenerative disc disease with disc space narrowing and spurring. Moderate  bilateral degenerative facet disease. Multilevel bilateral neural foraminal narrowing. Upper chest: No acute findings Other: None IMPRESSION: Diffuse degenerative disc and facet disease. No acute bony abnormality. Electronically Signed   By: Franky Crease M.D.   On: 10/29/2023 13:51   CT Maxillofacial Wo Contrast Result Date: 10/29/2023 CLINICAL DATA:  Facial trauma, blunt.  Fall. EXAM: CT MAXILLOFACIAL WITHOUT CONTRAST TECHNIQUE: Multidetector CT imaging of the maxillofacial structures was performed. Multiplanar CT image reconstructions were also generated. RADIATION DOSE REDUCTION: This exam was performed according to the departmental dose-optimization program which includes automated exposure control, adjustment of the mA and/or kV according to patient size and/or use of iterative reconstruction technique. COMPARISON:  None Available. FINDINGS: Osseous: No bony fractures visualized. There appears to be a fracture of the upper right midline incisor. Orbits: No fracture or orbital emphysema.  Globes intact. Sinuses: No air-fluid levels. Soft tissues: Negative Limited intracranial: See head CT report IMPRESSION: Fracture of the upper right midline incisor. No facial or orbital fracture. Electronically Signed   By: Franky Crease M.D.   On: 10/29/2023 13:50   CT Head Wo Contrast Result Date: 10/29/2023 CLINICAL DATA:  Head trauma, minor (Age >= 65y). Fall, facial injury EXAM: CT HEAD WITHOUT CONTRAST TECHNIQUE: Contiguous axial images were obtained from the base of the skull through the vertex without intravenous contrast. RADIATION DOSE REDUCTION: This exam was performed according to the departmental dose-optimization program which includes automated exposure control, adjustment of the mA and/or kV according to patient size and/or use of iterative reconstruction technique. COMPARISON:  None Available. FINDINGS: Brain: Diffuse cerebral atrophy. No acute intracranial abnormality. Specifically, no hemorrhage,  hydrocephalus, mass lesion, acute infarction, or significant intracranial injury. Vascular: No hyperdense vessel or unexpected calcification. Skull: No acute calvarial abnormality. Sinuses/Orbits: No acute findings Other: None IMPRESSION: Diffuse atrophy. No acute intracranial abnormality. Electronically Signed   By: Franky Crease M.D.   On: 10/29/2023 13:32   DG Chest Portable 1 View Result Date: 10/29/2023 CLINICAL DATA:  fall, broken front upper incisor, unknown if ingested, no respiratory sx, L shoulder skin tear EXAM: PORTABLE CHEST - 1 VIEW COMPARISON:  05/13/2015 FINDINGS: Cardiomediastinal silhouette and pulmonary vasculature are within normal limits. Lungs are clear. Dextroconvex scoliosis of the thoracolumbar spine again seen. IMPRESSION: No acute cardiopulmonary process. Electronically Signed  By: Aliene  Mir M.D.   On: 10/29/2023 12:53   DG Shoulder Right Result Date: 10/29/2023 CLINICAL DATA:  RIGHT shoulder injury status post fall. EXAM: RIGHT SHOULDER - 2+ VIEW COMPARISON:  None available FINDINGS: No fracture or dislocation. Mild spurring of the glenohumeral joint. Irregularity of the greater tuberosity indicative of rotator cuff tendinopathy. Prominent subacromial spur is noted. IMPRESSION: No acute abnormality of the RIGHT shoulder. Electronically Signed   By: Aliene Lloyd M.D.   On: 10/29/2023 12:52     Procedures   Medications Ordered in the ED - No data to display                                  Medical Decision Making Amount and/or Complexity of Data Reviewed Radiology: ordered.   Patient with mechanical slip and fall at home.  She sustained head and right shoulder injury.  CT imaging of the head, maxillofacial bones and cervical spine negative for severe injury.  X-ray of the shoulder and chest, negative for severe injury.  Patient does have dental fracture and luxation of incisor.  This will need attention from a dentist.  No significantly loose teeth at time of exam.   Patient will need to adhere to a soft diet, perform good bone care.  She has not had any decompensation in mental status during ED stay.  No anticoagulation use.  She appears to be at baseline.       Final diagnoses:  Dental injury, initial encounter  Lip laceration, initial encounter  Injury of head, initial encounter  Fall, initial encounter  Skin tear of right elbow without complication, initial encounter    ED Discharge Orders     None          Desiderio Chew, PA-C 10/29/23 1435    Desiderio Chew, PA-C 10/29/23 1537    Garrick Charleston, MD 10/30/23 [phone]

## 2023-10-29 NOTE — ED Notes (Signed)
 Pts daughter called and Leeroy to pick up pt from ER entrance.

## 2023-10-29 NOTE — ED Notes (Signed)
 Pt provided with apple sauce, and is tolerating well.

## 2023-10-29 NOTE — Discharge Instructions (Addendum)
 To evaluate your injuries, we performed CT of your head, facial bones, and neck today.  You have a broken tooth, but no injuries to the brain or to the bones of the face or neck.  We did an x-ray of your chest and of your right shoulder which were negative for fracture or dislocation.  The skin tear on the right shoulder will need wound care.  Please wash with mild soap and water  and rebandage twice a day until healed.  Avoid alcohol and peroxide.    For your dental injury, you will need to eat soft foods and follow-up with a dentist for repair.

## 2023-11-07 DIAGNOSIS — W109XXA Fall (on) (from) unspecified stairs and steps, initial encounter: Secondary | ICD-10-CM | POA: Diagnosis not present

## 2023-11-07 DIAGNOSIS — R188 Other ascites: Secondary | ICD-10-CM | POA: Diagnosis not present

## 2023-11-07 DIAGNOSIS — I10 Essential (primary) hypertension: Secondary | ICD-10-CM | POA: Diagnosis not present

## 2023-11-07 DIAGNOSIS — S40811A Abrasion of right upper arm, initial encounter: Secondary | ICD-10-CM | POA: Diagnosis not present

## 2023-11-07 DIAGNOSIS — S00532A Contusion of oral cavity, initial encounter: Secondary | ICD-10-CM | POA: Diagnosis not present

## 2023-12-29 DIAGNOSIS — H903 Sensorineural hearing loss, bilateral: Secondary | ICD-10-CM | POA: Diagnosis not present

## 2023-12-31 DIAGNOSIS — M419 Scoliosis, unspecified: Secondary | ICD-10-CM | POA: Diagnosis not present

## 2023-12-31 DIAGNOSIS — M25552 Pain in left hip: Secondary | ICD-10-CM | POA: Diagnosis not present

## 2023-12-31 DIAGNOSIS — Z1330 Encounter for screening examination for mental health and behavioral disorders, unspecified: Secondary | ICD-10-CM | POA: Diagnosis not present

## 2023-12-31 DIAGNOSIS — M545 Low back pain, unspecified: Secondary | ICD-10-CM | POA: Diagnosis not present

## 2024-01-20 DIAGNOSIS — M7062 Trochanteric bursitis, left hip: Secondary | ICD-10-CM | POA: Diagnosis not present

## 2024-01-20 DIAGNOSIS — M67952 Unspecified disorder of synovium and tendon, left thigh: Secondary | ICD-10-CM | POA: Diagnosis not present

## 2024-01-20 DIAGNOSIS — S76012A Strain of muscle, fascia and tendon of left hip, initial encounter: Secondary | ICD-10-CM | POA: Diagnosis not present

## 2024-01-27 DIAGNOSIS — M25551 Pain in right hip: Secondary | ICD-10-CM | POA: Diagnosis not present

## 2024-01-27 DIAGNOSIS — G8929 Other chronic pain: Secondary | ICD-10-CM | POA: Diagnosis not present

## 2024-01-27 DIAGNOSIS — M1611 Unilateral primary osteoarthritis, right hip: Secondary | ICD-10-CM | POA: Diagnosis not present

## 2024-02-02 DIAGNOSIS — M810 Age-related osteoporosis without current pathological fracture: Secondary | ICD-10-CM | POA: Diagnosis not present

## 2024-02-02 DIAGNOSIS — I10 Essential (primary) hypertension: Secondary | ICD-10-CM | POA: Diagnosis not present

## 2024-02-02 DIAGNOSIS — N183 Chronic kidney disease, stage 3 unspecified: Secondary | ICD-10-CM | POA: Diagnosis not present

## 2024-02-02 DIAGNOSIS — E871 Hypo-osmolality and hyponatremia: Secondary | ICD-10-CM | POA: Diagnosis not present

## 2024-02-02 DIAGNOSIS — E039 Hypothyroidism, unspecified: Secondary | ICD-10-CM | POA: Diagnosis not present
